# Patient Record
Sex: Female | Born: 1935 | Race: White | Hispanic: No | Marital: Married | Smoking: Never smoker
Health system: Southern US, Community
[De-identification: ages and names within clinical notes are randomized; demographics above are authoritative.]

## PROBLEM LIST (undated history)

## (undated) DIAGNOSIS — M51369 Other intervertebral disc degeneration, lumbar region without mention of lumbar back pain or lower extremity pain: Secondary | ICD-10-CM

## (undated) DIAGNOSIS — I10 Essential (primary) hypertension: Secondary | ICD-10-CM

## (undated) DIAGNOSIS — M419 Scoliosis, unspecified: Secondary | ICD-10-CM

## (undated) DIAGNOSIS — M7070 Other bursitis of hip, unspecified hip: Secondary | ICD-10-CM

## (undated) DIAGNOSIS — H269 Unspecified cataract: Secondary | ICD-10-CM

## (undated) DIAGNOSIS — K529 Noninfective gastroenteritis and colitis, unspecified: Secondary | ICD-10-CM

## (undated) DIAGNOSIS — M7740 Metatarsalgia, unspecified foot: Secondary | ICD-10-CM

## (undated) DIAGNOSIS — I350 Nonrheumatic aortic (valve) stenosis: Secondary | ICD-10-CM

## (undated) DIAGNOSIS — Q828 Other specified congenital malformations of skin: Secondary | ICD-10-CM

## (undated) DIAGNOSIS — C801 Malignant (primary) neoplasm, unspecified: Secondary | ICD-10-CM

## (undated) DIAGNOSIS — M2040 Other hammer toe(s) (acquired), unspecified foot: Secondary | ICD-10-CM

## (undated) DIAGNOSIS — M5136 Other intervertebral disc degeneration, lumbar region: Secondary | ICD-10-CM

## (undated) DIAGNOSIS — H35341 Macular cyst, hole, or pseudohole, right eye: Secondary | ICD-10-CM

## (undated) DIAGNOSIS — L851 Acquired keratosis [keratoderma] palmaris et plantaris: Secondary | ICD-10-CM

## (undated) DIAGNOSIS — M1990 Unspecified osteoarthritis, unspecified site: Secondary | ICD-10-CM

## (undated) DIAGNOSIS — M4856XA Collapsed vertebra, not elsewhere classified, lumbar region, initial encounter for fracture: Secondary | ICD-10-CM

## (undated) HISTORY — DX: Other intervertebral disc degeneration, lumbar region without mention of lumbar back pain or lower extremity pain: M51.369

## (undated) HISTORY — DX: Other intervertebral disc degeneration, lumbar region: M51.36

## (undated) HISTORY — PX: BACK SURGERY: SHX140

## (undated) HISTORY — DX: Other bursitis of hip, unspecified hip: M70.70

## (undated) HISTORY — DX: Macular cyst, hole, or pseudohole, right eye: H35.341

## (undated) HISTORY — DX: Other hammer toe(s) (acquired), unspecified foot: M20.40

## (undated) HISTORY — DX: Metatarsalgia, unspecified foot: M77.40

## (undated) HISTORY — PX: TOE AMPUTATION: SHX809

## (undated) HISTORY — DX: Acquired keratosis (keratoderma) palmaris et plantaris: L85.1

## (undated) HISTORY — DX: Noninfective gastroenteritis and colitis, unspecified: K52.9

## (undated) HISTORY — PX: BREAST SURGERY: SHX581

## (undated) HISTORY — PX: OTHER SURGICAL HISTORY: SHX169

## (undated) HISTORY — DX: Collapsed vertebra, not elsewhere classified, lumbar region, initial encounter for fracture: M48.56XA

## (undated) HISTORY — PX: LUMBAR LAMINECTOMY: SHX95

## (undated) HISTORY — PX: COLON SURGERY: SHX602

---

## 1998-04-21 ENCOUNTER — Other Ambulatory Visit: Admission: RE | Admit: 1998-04-21 | Discharge: 1998-04-21 | Payer: Self-pay | Admitting: Oncology

## 1998-08-16 ENCOUNTER — Other Ambulatory Visit: Admission: RE | Admit: 1998-08-16 | Discharge: 1998-08-16 | Payer: Self-pay | Admitting: *Deleted

## 1998-09-16 ENCOUNTER — Other Ambulatory Visit: Admission: RE | Admit: 1998-09-16 | Discharge: 1998-09-16 | Payer: Self-pay | Admitting: *Deleted

## 1999-09-08 ENCOUNTER — Other Ambulatory Visit: Admission: RE | Admit: 1999-09-08 | Discharge: 1999-09-08 | Payer: Self-pay | Admitting: *Deleted

## 1999-10-31 ENCOUNTER — Encounter: Admission: RE | Admit: 1999-10-31 | Discharge: 1999-10-31 | Payer: Self-pay | Admitting: Geriatric Medicine

## 1999-10-31 ENCOUNTER — Encounter: Payer: Self-pay | Admitting: Geriatric Medicine

## 2000-01-18 ENCOUNTER — Encounter: Payer: Self-pay | Admitting: Geriatric Medicine

## 2000-01-18 ENCOUNTER — Encounter: Admission: RE | Admit: 2000-01-18 | Discharge: 2000-01-18 | Payer: Self-pay | Admitting: Geriatric Medicine

## 2000-09-11 ENCOUNTER — Ambulatory Visit: Admission: RE | Admit: 2000-09-11 | Discharge: 2000-09-11 | Payer: Self-pay | Admitting: Orthopedic Surgery

## 2000-10-16 ENCOUNTER — Other Ambulatory Visit: Admission: RE | Admit: 2000-10-16 | Discharge: 2000-10-16 | Payer: Self-pay | Admitting: *Deleted

## 2000-11-15 ENCOUNTER — Ambulatory Visit (HOSPITAL_COMMUNITY): Admission: RE | Admit: 2000-11-15 | Discharge: 2000-11-15 | Payer: Self-pay | Admitting: Gastroenterology

## 2001-11-14 ENCOUNTER — Other Ambulatory Visit: Admission: RE | Admit: 2001-11-14 | Discharge: 2001-11-14 | Payer: Self-pay | Admitting: *Deleted

## 2002-06-09 ENCOUNTER — Encounter: Payer: Self-pay | Admitting: Geriatric Medicine

## 2002-06-09 ENCOUNTER — Encounter: Admission: RE | Admit: 2002-06-09 | Discharge: 2002-06-09 | Payer: Self-pay | Admitting: Geriatric Medicine

## 2003-05-12 ENCOUNTER — Other Ambulatory Visit: Admission: RE | Admit: 2003-05-12 | Discharge: 2003-05-12 | Payer: Self-pay | Admitting: Obstetrics & Gynecology

## 2003-05-26 ENCOUNTER — Ambulatory Visit (HOSPITAL_COMMUNITY): Admission: RE | Admit: 2003-05-26 | Discharge: 2003-05-26 | Payer: Self-pay | Admitting: Geriatric Medicine

## 2003-12-16 ENCOUNTER — Encounter: Admission: RE | Admit: 2003-12-16 | Discharge: 2003-12-16 | Payer: Self-pay | Admitting: Geriatric Medicine

## 2005-03-06 ENCOUNTER — Ambulatory Visit: Payer: Self-pay | Admitting: Oncology

## 2005-07-18 ENCOUNTER — Encounter: Admission: RE | Admit: 2005-07-18 | Discharge: 2005-07-18 | Payer: Self-pay | Admitting: Orthopedic Surgery

## 2005-10-25 ENCOUNTER — Ambulatory Visit (HOSPITAL_COMMUNITY): Admission: RE | Admit: 2005-10-25 | Discharge: 2005-10-25 | Payer: Self-pay | Admitting: Gastroenterology

## 2005-10-25 LAB — HM COLONOSCOPY

## 2005-12-29 ENCOUNTER — Encounter: Admission: RE | Admit: 2005-12-29 | Discharge: 2005-12-29 | Payer: Self-pay | Admitting: Geriatric Medicine

## 2006-03-05 ENCOUNTER — Ambulatory Visit: Payer: Self-pay | Admitting: Oncology

## 2006-03-07 LAB — COMPREHENSIVE METABOLIC PANEL
AST: 25 U/L (ref 0–37)
Albumin: 4.1 g/dL (ref 3.5–5.2)
Alkaline Phosphatase: 45 U/L (ref 39–117)
BUN: 15 mg/dL (ref 6–23)
CO2: 29 mEq/L (ref 19–32)
Calcium: 9.6 mg/dL (ref 8.4–10.5)
Chloride: 101 mEq/L (ref 96–112)
Glucose, Bld: 81 mg/dL (ref 70–99)
Total Bilirubin: 0.3 mg/dL (ref 0.3–1.2)

## 2006-03-07 LAB — CBC WITH DIFFERENTIAL/PLATELET
Basophils Absolute: 0 10*3/uL (ref 0.0–0.1)
Eosinophils Absolute: 0.1 10*3/uL (ref 0.0–0.5)
HGB: 12.4 g/dL (ref 11.6–15.9)
MCH: 29.5 pg (ref 26.0–34.0)
MCV: 88.9 fL (ref 81.0–101.0)
MONO#: 0.9 10*3/uL (ref 0.1–0.9)
NEUT#: 3.6 10*3/uL (ref 1.5–6.5)
NEUT%: 51.4 % (ref 39.6–76.8)
WBC: 7 10*3/uL (ref 3.9–10.0)

## 2006-03-27 ENCOUNTER — Encounter: Admission: RE | Admit: 2006-03-27 | Discharge: 2006-03-27 | Payer: Self-pay | Admitting: Geriatric Medicine

## 2007-03-04 ENCOUNTER — Ambulatory Visit: Payer: Self-pay | Admitting: Oncology

## 2007-03-06 LAB — CBC WITH DIFFERENTIAL/PLATELET
Basophils Absolute: 0 10*3/uL (ref 0.0–0.1)
Eosinophils Absolute: 0.1 10*3/uL (ref 0.0–0.5)
HCT: 36.1 % (ref 34.8–46.6)
LYMPH%: 36.4 % (ref 14.0–48.0)
MCH: 30 pg (ref 26.0–34.0)
MCHC: 34.6 g/dL (ref 32.0–36.0)
MONO#: 0.9 10*3/uL (ref 0.1–0.9)
MONO%: 13.5 % — ABNORMAL HIGH (ref 0.0–13.0)
NEUT%: 47.6 % (ref 39.6–76.8)
WBC: 6.9 10*3/uL (ref 3.9–10.0)

## 2007-03-06 LAB — COMPREHENSIVE METABOLIC PANEL
ALT: 13 U/L (ref 0–35)
Alkaline Phosphatase: 44 U/L (ref 39–117)
BUN: 18 mg/dL (ref 6–23)
Calcium: 9.5 mg/dL (ref 8.4–10.5)
Chloride: 105 mEq/L (ref 96–112)
Creatinine, Ser: 0.59 mg/dL (ref 0.40–1.20)
Potassium: 4.1 mEq/L (ref 3.5–5.3)

## 2007-03-06 LAB — LACTATE DEHYDROGENASE: LDH: 169 U/L (ref 94–250)

## 2007-03-06 LAB — CEA: CEA: 0.5 ng/mL (ref 0.0–5.0)

## 2007-10-03 DIAGNOSIS — H35341 Macular cyst, hole, or pseudohole, right eye: Secondary | ICD-10-CM

## 2007-10-03 HISTORY — DX: Macular cyst, hole, or pseudohole, right eye: H35.341

## 2008-08-19 ENCOUNTER — Encounter: Admission: RE | Admit: 2008-08-19 | Discharge: 2008-08-19 | Payer: Self-pay | Admitting: Geriatric Medicine

## 2009-05-16 ENCOUNTER — Emergency Department (HOSPITAL_COMMUNITY): Admission: EM | Admit: 2009-05-16 | Discharge: 2009-05-16 | Payer: Self-pay | Admitting: Emergency Medicine

## 2009-05-21 ENCOUNTER — Encounter: Admission: RE | Admit: 2009-05-21 | Discharge: 2009-05-21 | Payer: Self-pay | Admitting: Geriatric Medicine

## 2009-05-31 ENCOUNTER — Ambulatory Visit (HOSPITAL_COMMUNITY): Admission: RE | Admit: 2009-05-31 | Discharge: 2009-06-01 | Payer: Self-pay | Admitting: Neurosurgery

## 2009-09-15 ENCOUNTER — Encounter: Admission: RE | Admit: 2009-09-15 | Discharge: 2009-10-01 | Payer: Self-pay | Admitting: Neurosurgery

## 2009-10-04 ENCOUNTER — Encounter: Admission: RE | Admit: 2009-10-04 | Discharge: 2009-10-14 | Payer: Self-pay | Admitting: Neurosurgery

## 2010-12-28 ENCOUNTER — Other Ambulatory Visit: Payer: Self-pay | Admitting: Neurosurgery

## 2010-12-28 DIAGNOSIS — M545 Low back pain: Secondary | ICD-10-CM

## 2010-12-28 DIAGNOSIS — M25512 Pain in left shoulder: Secondary | ICD-10-CM

## 2010-12-31 ENCOUNTER — Ambulatory Visit
Admission: RE | Admit: 2010-12-31 | Discharge: 2010-12-31 | Disposition: A | Discharge status: Home / Self Care | Payer: Medicare Other | Source: Ambulatory Visit | Attending: Neurosurgery | Admitting: Neurosurgery

## 2010-12-31 DIAGNOSIS — M25512 Pain in left shoulder: Secondary | ICD-10-CM

## 2010-12-31 DIAGNOSIS — M25511 Pain in right shoulder: Secondary | ICD-10-CM

## 2010-12-31 DIAGNOSIS — M545 Low back pain: Secondary | ICD-10-CM

## 2011-01-07 LAB — CBC
HCT: 38.1 % (ref 36.0–46.0)
Hemoglobin: 13.2 g/dL (ref 12.0–15.0)
MCHC: 34.7 g/dL (ref 30.0–36.0)
MCV: 88.6 fL (ref 78.0–100.0)
Platelets: 364 10*3/uL (ref 150–400)
RBC: 4.3 MIL/uL (ref 3.87–5.11)
RDW: 13.8 % (ref 11.5–15.5)
WBC: 9.4 10*3/uL (ref 4.0–10.5)

## 2011-01-07 LAB — DIFFERENTIAL
Basophils Absolute: 0 10*3/uL (ref 0.0–0.1)
Eosinophils Relative: 1 % (ref 0–5)
Monocytes Absolute: 0.9 10*3/uL (ref 0.1–1.0)
Neutro Abs: 6.4 10*3/uL (ref 1.7–7.7)
Neutrophils Relative %: 68 % (ref 43–77)

## 2011-01-07 LAB — BASIC METABOLIC PANEL
BUN: 9 mg/dL (ref 6–23)
CO2: 24 mEq/L (ref 19–32)
Calcium: 9.1 mg/dL (ref 8.4–10.5)
Creatinine, Ser: 0.49 mg/dL (ref 0.4–1.2)
GFR calc non Af Amer: >60 mL/min (ref >60)

## 2011-01-07 LAB — TYPE AND SCREEN
ABO/RH(D): A POS
Antibody Screen: NEGATIVE

## 2011-01-25 ENCOUNTER — Ambulatory Visit: Payer: Medicare Other | Attending: Neurosurgery | Admitting: Physical Therapy

## 2011-01-25 DIAGNOSIS — M545 Low back pain, unspecified: Secondary | ICD-10-CM | POA: Insufficient documentation

## 2011-01-25 DIAGNOSIS — IMO0001 Reserved for inherently not codable concepts without codable children: Secondary | ICD-10-CM | POA: Insufficient documentation

## 2011-01-25 DIAGNOSIS — R293 Abnormal posture: Secondary | ICD-10-CM | POA: Insufficient documentation

## 2011-01-25 DIAGNOSIS — M542 Cervicalgia: Secondary | ICD-10-CM | POA: Insufficient documentation

## 2011-01-25 DIAGNOSIS — M2560 Stiffness of unspecified joint, not elsewhere classified: Secondary | ICD-10-CM | POA: Insufficient documentation

## 2011-02-01 ENCOUNTER — Ambulatory Visit: Payer: Medicare Other | Attending: Neurosurgery | Admitting: Physical Therapy

## 2011-02-01 DIAGNOSIS — M2560 Stiffness of unspecified joint, not elsewhere classified: Secondary | ICD-10-CM | POA: Insufficient documentation

## 2011-02-01 DIAGNOSIS — M545 Low back pain, unspecified: Secondary | ICD-10-CM | POA: Insufficient documentation

## 2011-02-01 DIAGNOSIS — IMO0001 Reserved for inherently not codable concepts without codable children: Secondary | ICD-10-CM | POA: Insufficient documentation

## 2011-02-01 DIAGNOSIS — M542 Cervicalgia: Secondary | ICD-10-CM | POA: Insufficient documentation

## 2011-02-01 DIAGNOSIS — R293 Abnormal posture: Secondary | ICD-10-CM | POA: Insufficient documentation

## 2011-02-03 ENCOUNTER — Ambulatory Visit: Payer: Medicare Other | Admitting: Physical Therapy

## 2011-02-07 ENCOUNTER — Encounter: Payer: Medicare Other | Admitting: Physical Therapy

## 2011-02-09 ENCOUNTER — Ambulatory Visit: Payer: Medicare Other | Admitting: Physical Therapy

## 2011-02-14 NOTE — Op Note (Signed)
NAMEBARBY, COLVARD              ACCOUNT NO.:  [id number]   MEDICAL RECORD NO.:  [id number]          PATIENT TYPE:  INP   LOCATION:  3528                         FACILITY:  [facility]   PHYSICIAN:  Sherilyn Cooter A. Pool, M.D.    DATE OF BIRTH:  14-Mar-1923   DATE OF PROCEDURE:  05/31/2009  DATE OF DISCHARGE:                               OPERATIVE REPORT   PREOPERATIVE DIAGNOSIS:  Right L5-S1 herniated nucleus pulposus with  radiculopathy.   POSTOPERATIVE DIAGNOSIS:  Right L5-S1 herniated nucleus pulposus with  radiculopathy.   PROCEDURE NOTE:  Right L5-S1 laminotomy and microdiskectomy.   SURGEON:  Kathaleen Maser. Pool, MD   ASSISTANT:  Donalee Citrin, MD   ANESTHESIA:  General endotracheal.   INDICATIONS:  Ms. Duce is an 77 year old female with history of back  and right lower extremity pain presents with weakness consistent with a  right-sided L5 radiculopathy.  Workup demonstrates evidence of a large  right-sided L5-S1 disk herniation with a superior free fragment causing  marked compression exiting right L5 nerve root.  The patient has been  counseled as to her options.  She decided to proceed with a right-sided  L5-S1 laminotomy and microdiskectomy to help treatment of her symptoms.   OPERATIVE NOTE:  The patient was taken to the operating room table and  placed in the supine position.  Adequate level of anesthesia was  achieved.  The patient was prone onto Wilson frame, appropriately padded  the patient's lumbar region, and prepped and draped sterilely.  A #10  blade was used to make a curvilinear skin incision overlying the L4-L5  interspace.  This was carried sharply in the midline.  Subperiosteal  dissection was performed on the right side exposing the lamina and facet  joints of L5 and S1.  Deep self-retaining retractor was placed.  Intraoperative x-rays were taken and level was confirmed.  A laminotomy  was then performed using a high-speed drill and Kerrison rongeurs to  remove the  inferior aspect of the lamina of L5, medial aspect of the L5-  S1 facet joint, and the superior rim of the S1 lamina.  Ligamentum  flavum was then elevated and resected in piecemeal fashion using  Kerrison rongeurs.  Underlying the thecal sac and exiting L5 and S1  nerve roots were identified.  Microscope was brought to the field for  microdissection of the disk herniation and nerve roots.  Epidural venous  plexus was coagulated.  The thecal sac and S1 nerve root gently  mobilized and tracked towards the midline.  Disk herniation was readily  apparent.  This was then dissected free and removed using blunt nerve  hooks and pituitary rongeurs.  All elements of the disk herniation were  resected.  Disk space was incised with 15 blade in a rectangular  fashion.  Wide disk space clean-out was achieved using pituitary  rongeurs, upbitting pituitary rongeurs, and Epstein curettes.  All  elements of the disk herniation were resected.  All loose or obviously  degenerative disk material was then removed from the interspace.  At  this point, a very thorough diskectomy had been achieved.  There was no  injury to the thecal sac or nerve roots.  The wound was then irrigated  with antibiotic solution.  Gelfoam was placed topically for  hemostasis and found to be good.  Microscope and retractors were  removed.  Hemostasis of the muscles was achieved with electrocautery.  Wounds were closed in layers with Vicryl suture.  Steri-Strips and  sterile dressing were applied.  The patient tolerated the procedure well  and she returned to the recovery room postoperatively.           ______________________________  Kathaleen Maser Pool, M.D.     HAP/MEDQ  D:  05/31/2009  T:  06/01/2009  Job:  [id number]

## 2011-02-16 ENCOUNTER — Ambulatory Visit: Payer: Medicare Other | Admitting: Physical Therapy

## 2011-02-17 NOTE — Procedures (Signed)
[facility]  Patient:    Whitney Marquez, Whitney Marquez                     MRN: [id number] Proc. Date: 11/15/00 Adm. Date:  [id number] Disc. Date: [id number] Attending:  Dennison Bulla Ii CC:         Hal T. Stoneking, M.D.  Angelia Mould. Derrell Lolling, M.D.   Procedure Report  REFERRING PHYSICIAN:  Hal T. Stoneking, M.D./Haywood M. Derrell Lolling, M.D.  PROCEDURE INDICATION:  [name]. Badders (date of birth is 1923-03-26), is a 77 year old female.  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  Her March 1999 colonoscopy was normal.  She occasionally experiences discomfort in the left and right lower quadrants of her abdomen.  She is due for surveillance colonoscopy with possible polypectomy to prevent recurrent colon cancer.  I discussed with [name] the complications associated with colonoscopy and polypectomy including intestinal bleeding and intestinal perforation.  [name] has signed the operative permit.  ENDOSCOPIST:  Verlin Grills, M.D.  PREMEDICATION:  Fentanyl 25 mcg, Versed 5 mg  ENDOSCOPE:  Olympus pediatric colonoscope.  DESCRIPTION OF PROCEDURE:  After obtaining informed consent, the patient was placed in the left lateral decubitus position.  I administered intravenous Fentanyl and intravenous Versed to achieve conscious sedation for the procedure.  The patients blood pressure, oxygen saturation and cardiac rhythm were monitored throughout the procedure and documented in the medical record.  Anal inspection was normal.  Digital rectal exam was normal.  The Olympus pediatric video colonoscope was introduced into the rectum and under direct vision advanced to the ileoright colonic anastomosis.  Colonic preparation for the exam today was excellent.  Rectum normal.  Sigmoid colon and descending colon normal.  Splenic flexure normal.  Transverse colon normal.  Hepatic  flexure normal.  Ascending colon normal.  Ileoright colonic anastomosis normal.  ASSESSMENT:  Normal proctocolonoscopy to the ileoright colonic surgical anastomosis.  No endoscopic evidence for the presence of recurrent colorectal neoplasia.  RECOMMENDATIONS:  Repeat colonoscopy in approximately five years. DD:  11/15/00 TD:  11/15/00 Job: [id number]

## 2011-02-17 NOTE — Op Note (Signed)
[name], [name]              ACCOUNT NO.:  [id number]   MEDICAL RECORD NO.:  [id number]          PATIENT TYPE:  AMB   LOCATION:  ENDO                         FACILITY[facility]   PHYSICIAN:  Danise Edge, M.D.   DATE OF BIRTH:  Jan 27, 1923   DATE OF PROCEDURE:  10/25/2005  DATE OF DISCHARGE:                                 OPERATIVE REPORT   PROCEDURE:  Surveillance colonoscopy.   INDICATIONS:  Ms. Whitney Marquez is an 77 year old female born October 31, 1922. In 1998, Ms. [name] underwent a right hemicolectomy to remove a Dukes  B adenocarcinoma of the colon. Surveillance colonoscopies in 1999 and in  2001 were normal.   ENDOSCOPIST:  Danise Edge, M.D.   PREMEDICATION:  Versed 4 mg, fentanyl 25 mcg.   DESCRIPTION OF PROCEDURE:  After obtaining informed consent, Ms. [name] was  placed in the left lateral decubitus position. I administered intravenous  fentanyl and intravenous Versed to achieve conscious sedation for the  procedure. The patient's blood pressure, oxygen saturation and cardiac  rhythm were monitored throughout the procedure and documented in the medical  record.   Anal inspection and digital rectal exam were normal. The Olympus adjustable  pediatric colonoscope was introduced into the rectum and easily advanced to  the ileo right colonic surgical anastomosis. Colonic preparation for the  exam today was excellent.   RECTUM:  Normal. Retroflexed view of the distal rectum normal.  SIGMOID COLON AND DESCENDING COLON:  A few small diverticula are present.  SPLENIC FLEXURE:  Normal.  TRANSVERSE COLON:  Normal.  HEPATIC FLEXURE:  Normal.  ASCENDING COLON:  Normal.  ILEO RIGHT COLONIC SURGICAL ANASTOMOSIS:  Normal.   ASSESSMENT:  Normal surveillance proctocolonoscopy to the ileo right colonic  surgical anastomosis. No endoscopic evidence for the presence of recurrent  colorectal neoplasia. A few small diverticula are present in the left colon.   RECOMMENDATIONS:  I do not think Ms. [name] will require any further  colonoscopic surveillance.           ______________________________  Danise Edge, M.D.     MJ/MEDQ  D:  10/25/2005  T:  10/26/2005  Job:  [id number]   cc:   Hal T. Stoneking, M.D.  Fax: [phone]

## 2011-02-21 ENCOUNTER — Ambulatory Visit: Payer: Medicare Other | Admitting: Physical Therapy

## 2011-02-23 ENCOUNTER — Ambulatory Visit: Payer: Medicare Other | Admitting: Physical Therapy

## 2011-02-28 ENCOUNTER — Ambulatory Visit: Payer: Medicare Other | Admitting: Physical Therapy

## 2011-03-02 ENCOUNTER — Encounter: Payer: Medicare Other | Admitting: Physical Therapy

## 2011-06-20 ENCOUNTER — Ambulatory Visit: Payer: Medicare Other | Attending: Neurosurgery

## 2011-06-20 DIAGNOSIS — M545 Low back pain, unspecified: Secondary | ICD-10-CM | POA: Insufficient documentation

## 2011-06-20 DIAGNOSIS — M2560 Stiffness of unspecified joint, not elsewhere classified: Secondary | ICD-10-CM | POA: Insufficient documentation

## 2011-06-20 DIAGNOSIS — IMO0001 Reserved for inherently not codable concepts without codable children: Secondary | ICD-10-CM | POA: Insufficient documentation

## 2011-06-20 DIAGNOSIS — R293 Abnormal posture: Secondary | ICD-10-CM | POA: Insufficient documentation

## 2011-06-20 DIAGNOSIS — M542 Cervicalgia: Secondary | ICD-10-CM | POA: Insufficient documentation

## 2011-06-22 ENCOUNTER — Ambulatory Visit: Payer: Medicare Other

## 2011-06-26 ENCOUNTER — Ambulatory Visit: Payer: Medicare Other | Admitting: Rehabilitation

## 2011-06-28 ENCOUNTER — Ambulatory Visit: Payer: Medicare Other | Admitting: Rehabilitation

## 2011-06-29 ENCOUNTER — Encounter: Payer: Medicare Other | Admitting: Rehabilitation

## 2011-07-11 ENCOUNTER — Ambulatory Visit: Payer: Medicare Other | Attending: Neurosurgery

## 2011-07-11 DIAGNOSIS — R293 Abnormal posture: Secondary | ICD-10-CM | POA: Insufficient documentation

## 2011-07-11 DIAGNOSIS — IMO0001 Reserved for inherently not codable concepts without codable children: Secondary | ICD-10-CM | POA: Insufficient documentation

## 2011-07-11 DIAGNOSIS — M545 Low back pain, unspecified: Secondary | ICD-10-CM | POA: Insufficient documentation

## 2011-07-11 DIAGNOSIS — M2560 Stiffness of unspecified joint, not elsewhere classified: Secondary | ICD-10-CM | POA: Insufficient documentation

## 2011-07-11 DIAGNOSIS — M542 Cervicalgia: Secondary | ICD-10-CM | POA: Insufficient documentation

## 2011-07-13 ENCOUNTER — Ambulatory Visit: Payer: Medicare Other | Admitting: Rehabilitation

## 2011-07-18 ENCOUNTER — Ambulatory Visit: Payer: Medicare Other | Admitting: Rehabilitation

## 2011-07-20 ENCOUNTER — Ambulatory Visit: Payer: Medicare Other

## 2011-07-24 ENCOUNTER — Ambulatory Visit: Payer: Medicare Other | Admitting: Rehabilitative and Restorative Service Providers"

## 2011-07-26 ENCOUNTER — Ambulatory Visit: Payer: Medicare Other | Admitting: Rehabilitation

## 2011-08-01 ENCOUNTER — Ambulatory Visit: Payer: Medicare Other

## 2011-08-03 ENCOUNTER — Ambulatory Visit: Payer: Medicare Other | Attending: Neurosurgery

## 2011-08-03 DIAGNOSIS — M545 Low back pain, unspecified: Secondary | ICD-10-CM | POA: Insufficient documentation

## 2011-08-03 DIAGNOSIS — R293 Abnormal posture: Secondary | ICD-10-CM | POA: Insufficient documentation

## 2011-08-03 DIAGNOSIS — IMO0001 Reserved for inherently not codable concepts without codable children: Secondary | ICD-10-CM | POA: Insufficient documentation

## 2011-08-03 DIAGNOSIS — M2560 Stiffness of unspecified joint, not elsewhere classified: Secondary | ICD-10-CM | POA: Insufficient documentation

## 2011-08-03 DIAGNOSIS — M542 Cervicalgia: Secondary | ICD-10-CM | POA: Insufficient documentation

## 2013-02-05 LAB — HM PAP SMEAR

## 2013-03-25 ENCOUNTER — Emergency Department (INDEPENDENT_AMBULATORY_CARE_PROVIDER_SITE_OTHER): Payer: Medicare Other

## 2013-03-25 ENCOUNTER — Encounter (HOSPITAL_COMMUNITY): Payer: Self-pay | Admitting: *Deleted

## 2013-03-25 ENCOUNTER — Emergency Department (INDEPENDENT_AMBULATORY_CARE_PROVIDER_SITE_OTHER)
Admission: EM | Admit: 2013-03-25 | Discharge: 2013-03-25 | Disposition: A | Discharge status: Home / Self Care | Payer: Medicare Other | Source: Home / Self Care | Attending: Family Medicine | Admitting: Family Medicine

## 2013-03-25 DIAGNOSIS — S52599A Other fractures of lower end of unspecified radius, initial encounter for closed fracture: Secondary | ICD-10-CM

## 2013-03-25 DIAGNOSIS — S52501A Unspecified fracture of the lower end of right radius, initial encounter for closed fracture: Secondary | ICD-10-CM

## 2013-03-25 HISTORY — DX: Malignant (primary) neoplasm, unspecified: C80.1

## 2013-03-25 HISTORY — DX: Essential (primary) hypertension: I10

## 2013-03-25 HISTORY — DX: Unspecified cataract: H26.9

## 2013-03-25 MED ORDER — TRAMADOL HCL 50 MG PO TABS: 50.0000 mg | ORAL_TABLET | Freq: Four times a day (QID) | ORAL | Status: DC | PRN

## 2013-03-25 MED ORDER — ACETAMINOPHEN 500 MG PO TABS: 500.0000 mg | ORAL_TABLET | Freq: Four times a day (QID) | ORAL | Status: DC | PRN

## 2013-03-25 NOTE — ED Notes (Signed)
Pt reports    She  Felled     yest  Landed  On  Her  r  Wrist  She  Has  Pain  /  Swelling  Noted         denys  Any  Other  injurys

## 2013-03-25 NOTE — Discharge Instructions (Signed)
Cast or Splint Care  Casts and splints support injured limbs and keep bones from moving while they heal.   HOME CARE   Keep the cast or splint uncovered during the drying period.   A plaster cast can take 24 to 48 hours to dry.   A fiberglass cast will dry in less than 1 hour.   Do not rest the cast on anything harder than a pillow for 24 hours.   Do not put weight on your injured limb. Do not put pressure on the cast. Wait for your doctor's approval.   Keep the cast or splint dry.   Cover the cast or splint with a plastic bag during baths or wet weather.   If you have a cast over your chest and belly (trunk), take sponge baths until the cast is taken off.   Keep your cast or splint clean. Wash a dirty cast with a damp cloth.   Do not put any objects under your cast or splint. Do not scratch the skin under the cast with an object.   Do not take out the padding from inside your cast.   Exercise your joints near the cast as told by your doctor.   Raise (elevate) your injured limb on 1 or 2 pillows for the first 1 to 3 days.  GET HELP RIGHT AWAY IF:   Your cast or splint cracks.   Your cast or splint is too tight or too loose.   You itch badly under the cast.   Your cast gets wet or has a soft spot.   You have a bad smell coming from the cast.   You get an object stuck under the cast.   Your skin around the cast becomes red or raw.   You have new or more pain after the cast is put on.   You have fluid leaking through the cast.   You cannot move your fingers or toes.   Your fingers or toes turn colors or are cool, painful, or puffy (swollen).   You have tingling or lose feeling (numbness) around the injured area.   You have pain or pressure under the cast.   You have trouble breathing or have shortness of breath.   You have chest pain.  MAKE SURE YOU:   Understand these instructions.   Will watch your condition.   Will get help right away if you are not doing well or get worse.  Document  Released: 01/18/2011 Document Revised: 12/11/2011 Document Reviewed: 01/18/2011  Exit[employer] Patient Information 2014 [employer].

## 2013-03-25 NOTE — ED Provider Notes (Signed)
History    CSN: [id number] Arrival date & time 03/25/13  1002  First MD Initiated Contact with Patient 03/25/13 1036     Chief Complaint  Patient presents with  . Fall   (Consider location/radiation/quality/duration/timing/severity/associated sxs/prior Treatment) Patient is a 79 y.o. female presenting with fall. The history is provided by the patient and a relative. No language interpreter was used.  Fall This is a new problem. The current episode started yesterday. Pertinent negatives include no chest pain, no abdominal pain, no headaches and no shortness of breath. Associated symptoms comments: The patient was outside weaning yesterday around him. She fell to her right side and brace her fall with her right wrist. She developed pain and swelling throughout the rest of the day and into the evening. She took 2 doses of extra strength Tylenol. She called her PCP Dr. [facility] [place] and was instructed to go to urgent care as he would have you see her until tomorrow.  She denies head trauma. Her only pain is right wrist the pain is diffuse of both the dorsal and volar aspect of the wrist. The pain is worse with movement. She describes the pain as 10 out of 10 severity. She does have a history of poor balance he did have a fall about 6 months ago while she was kneeling in her garden.  . She has tried acetaminophen for the symptoms. The treatment provided mild relief.   Past Medical History  Diagnosis Date  . Hypertension   . Cancer   . Cataract    Past Surgical History  Procedure Laterality Date  . Back surgery    . Colon surgery     History reviewed. No pertinent family history. History  Substance Use Topics  . Smoking status: Never Smoker   . Smokeless tobacco: Not on file  . Alcohol Use: No   OB History   Grav Para Term Preterm Abortions TAB SAB Ect Mult Living                 Review of Systems  Constitutional: Negative.   Respiratory: Negative for  shortness of breath.   Cardiovascular: Negative for chest pain.  Gastrointestinal: Negative for nausea and abdominal pain.  Musculoskeletal: Positive for myalgias and joint swelling.  Neurological: Negative for headaches.    Allergies  Cephalosporins; Iodine; Macrolides and ketolides; Penicillins; and Pyridium  Home Medications   Current Outpatient Rx  Name  Route  Sig  Dispense  Refill  . Alendronate Sodium (FOSAMAX PO)   Oral   Take by mouth.         Marland Kitchen amLODipine (NORVASC) 2.5 MG tablet   Oral   Take 2.5 mg by mouth daily.         Marland Kitchen aspirin 81 MG tablet   Oral   Take 81 mg by mouth daily.         . fenofibrate 54 MG tablet   Oral   Take 54 mg by mouth daily.         . Multiple Vitamins-Minerals (CENTRUM PO)   Oral   Take by mouth.          BP 138/74  Pulse 85  Temp(Src) 97.6 F (36.4 C) (Oral)  Resp 16  SpO2 91% Physical Exam  Constitutional: She appears well-developed and well-nourished. No distress.  HENT:  Head: Normocephalic and atraumatic.  Cardiovascular: Normal pulses.   Pulses:      Radial pulses are 2+ on the right side.  Musculoskeletal:       Right wrist: She exhibits decreased range of motion, tenderness and swelling.  Ulnar deviation of MCP joint diffusely.     ED Course  Procedures (including critical care time) Labs Reviewed - No data to display Dg Wrist Complete Right  03/25/2013   *RADIOLOGY REPORT*  Clinical Data: Cough fall.  Wrist pain and swelling.  Bruising around the right wrist.  Pain in the proximal fourth and fifth metacarpals.  RIGHT WRIST - COMPLETE 3+ VIEW  Comparison: None.  Findings: The alignment the wrist is within normal limits.  There is soft tissue swelling over the dorsum of the wrist.  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  Metacarpal bases appear intact.  There is abnormal flaring of the ulnar aspect of the distal radius adjacent to the distal radial ulnar joint. Additionally,  there is rarefaction of bone in the distal radial metaphysis. Although this could be degenerative, fracture cannot be excluded and follow-up CT is recommended.  Chondrocalcinosis of the triangular fibrocartilage noted.  IMPRESSION: Suspected distal radius fracture adjacent to the distal radial ulnar joint. Noncontrast CT of the right wrist recommended.   Original Report Authenticated By: Andreas Newport, M.D.   No diagnosis found.  MDM  Reviewed x-ray with patient. She has a distal radius fracture adjacent to the ulnar joint.I discussed the patient with Dr. Izora Ribas (Hand Surgery).   Plan: Sugar tong splint.  Hand surgery follow up patient provided number to call for appt.  Pain control: Schedule tylenol 500 mg every 6 hrs.  Tramadol 50 mg q 6 prn.   Dessa Phi, MD 03/25/13 1226

## 2013-03-25 NOTE — ED Notes (Signed)
Went to obtain patient for x-rays, attending wanted to exam patient.

## 2013-04-08 NOTE — ED Provider Notes (Signed)
Medical screening examination/treatment/procedure(s) were performed by resident physician or non-physician practitioner and as supervising physician I was immediately available for consultation/collaboration.   KINDL,JAMES DOUGLAS MD.   James D Kindl, MD 04/08/13 0846 

## 2013-09-15 DIAGNOSIS — L851 Acquired keratosis [keratoderma] palmaris et plantaris: Secondary | ICD-10-CM

## 2013-09-15 HISTORY — DX: Acquired keratosis (keratoderma) palmaris et plantaris: L85.1

## 2014-01-13 ENCOUNTER — Ambulatory Visit (HOSPITAL_COMMUNITY): Payer: Medicare Other | Attending: Cardiovascular Disease | Admitting: Radiology

## 2014-01-13 ENCOUNTER — Other Ambulatory Visit (HOSPITAL_COMMUNITY): Payer: Self-pay | Admitting: Geriatric Medicine

## 2014-01-13 DIAGNOSIS — I359 Nonrheumatic aortic valve disorder, unspecified: Secondary | ICD-10-CM

## 2014-01-13 DIAGNOSIS — I059 Rheumatic mitral valve disease, unspecified: Secondary | ICD-10-CM

## 2014-01-13 NOTE — Progress Notes (Signed)
Echocardiogram performed.  

## 2014-01-21 ENCOUNTER — Other Ambulatory Visit: Payer: Self-pay | Admitting: Sports Medicine

## 2014-01-21 DIAGNOSIS — IMO0002 Reserved for concepts with insufficient information to code with codable children: Secondary | ICD-10-CM

## 2014-01-27 ENCOUNTER — Ambulatory Visit
Admission: RE | Admit: 2014-01-27 | Discharge: 2014-01-27 | Disposition: A | Discharge status: Home / Self Care | Payer: Medicare Other | Source: Ambulatory Visit | Attending: Sports Medicine | Admitting: Sports Medicine

## 2014-01-27 DIAGNOSIS — IMO0002 Reserved for concepts with insufficient information to code with codable children: Secondary | ICD-10-CM

## 2014-02-24 LAB — HM DEXA SCAN

## 2014-03-31 ENCOUNTER — Ambulatory Visit: Payer: Medicare Other | Attending: Physical Medicine and Rehabilitation

## 2014-03-31 DIAGNOSIS — R293 Abnormal posture: Secondary | ICD-10-CM | POA: Insufficient documentation

## 2014-03-31 DIAGNOSIS — R262 Difficulty in walking, not elsewhere classified: Secondary | ICD-10-CM | POA: Insufficient documentation

## 2014-03-31 DIAGNOSIS — M545 Low back pain, unspecified: Secondary | ICD-10-CM | POA: Insufficient documentation

## 2014-03-31 DIAGNOSIS — IMO0001 Reserved for inherently not codable concepts without codable children: Secondary | ICD-10-CM | POA: Insufficient documentation

## 2014-04-07 ENCOUNTER — Ambulatory Visit: Payer: Medicare Other | Attending: Physical Medicine and Rehabilitation

## 2014-04-07 DIAGNOSIS — M545 Low back pain, unspecified: Secondary | ICD-10-CM | POA: Diagnosis not present

## 2014-04-07 DIAGNOSIS — R262 Difficulty in walking, not elsewhere classified: Secondary | ICD-10-CM | POA: Diagnosis not present

## 2014-04-07 DIAGNOSIS — IMO0001 Reserved for inherently not codable concepts without codable children: Secondary | ICD-10-CM | POA: Diagnosis not present

## 2014-04-07 DIAGNOSIS — R293 Abnormal posture: Secondary | ICD-10-CM | POA: Diagnosis not present

## 2014-04-09 ENCOUNTER — Ambulatory Visit: Payer: Medicare Other

## 2014-04-09 DIAGNOSIS — IMO0001 Reserved for inherently not codable concepts without codable children: Secondary | ICD-10-CM | POA: Diagnosis not present

## 2014-04-14 ENCOUNTER — Ambulatory Visit: Payer: Medicare Other | Admitting: Physical Therapy

## 2014-04-16 ENCOUNTER — Ambulatory Visit: Payer: Medicare Other | Admitting: Physical Therapy

## 2014-04-16 DIAGNOSIS — IMO0001 Reserved for inherently not codable concepts without codable children: Secondary | ICD-10-CM | POA: Diagnosis not present

## 2014-04-22 ENCOUNTER — Ambulatory Visit: Payer: Medicare Other | Admitting: Physical Therapy

## 2014-04-22 DIAGNOSIS — IMO0001 Reserved for inherently not codable concepts without codable children: Secondary | ICD-10-CM | POA: Diagnosis not present

## 2014-04-28 ENCOUNTER — Ambulatory Visit: Payer: Medicare Other | Admitting: Physical Therapy

## 2014-04-28 DIAGNOSIS — IMO0001 Reserved for inherently not codable concepts without codable children: Secondary | ICD-10-CM | POA: Diagnosis not present

## 2014-04-30 ENCOUNTER — Ambulatory Visit: Payer: Medicare Other | Admitting: Physical Therapy

## 2014-04-30 DIAGNOSIS — IMO0001 Reserved for inherently not codable concepts without codable children: Secondary | ICD-10-CM | POA: Diagnosis not present

## 2014-05-14 ENCOUNTER — Ambulatory Visit (INDEPENDENT_AMBULATORY_CARE_PROVIDER_SITE_OTHER): Payer: Medicare Other | Admitting: Podiatry

## 2014-05-14 ENCOUNTER — Encounter: Payer: Self-pay | Admitting: Podiatry

## 2014-05-14 DIAGNOSIS — M79609 Pain in unspecified limb: Secondary | ICD-10-CM

## 2014-05-14 DIAGNOSIS — L84 Corns and callosities: Secondary | ICD-10-CM

## 2014-05-14 DIAGNOSIS — M79673 Pain in unspecified foot: Secondary | ICD-10-CM

## 2014-05-14 DIAGNOSIS — B351 Tinea unguium: Secondary | ICD-10-CM

## 2014-05-14 NOTE — Progress Notes (Signed)
   Subjective:    Patient ID: Whitney Marquez, female    DOB: 07-06-1923, 80 y.o.   MRN: [id number]  HPI Comments: "I need the calluses looked at"  Patient has painful calluses plantar forefoot bilateral for several years. She keeps the areas padded.   Also would like her toenails cut.     Review of Systems  HENT: Positive for hearing loss.   Eyes: Positive for visual disturbance.  Musculoskeletal: Positive for arthralgias, back pain, gait problem and myalgias.  Allergic/Immunologic: Positive for food allergies.  Neurological: Positive for weakness and light-headedness.  Hematological: Bruises/bleeds easily.  All other systems reviewed and are negative.      Objective:   Physical Exam        Assessment & Plan:

## 2014-05-15 NOTE — Progress Notes (Signed)
Subjective:     Patient ID: Whitney Marquez, female   DOB: 1923-06-01, 80 y.o.   MRN: [id number]  HPI patient presents with painful nail disease 1-5 of both feet that are thick and hard for her to cut along with lesion formation on the bottom of the right foot that becomes painful and has been going on now for a long time. States that she would like to have this done on a routine basis and cannot take care of herself   Review of Systems  All other systems reviewed and are negative.      Objective:   Physical Exam  Nursing note and vitals reviewed. Constitutional: She is oriented to person, place, and time.  Cardiovascular: Intact distal pulses.   Musculoskeletal: Normal range of motion.  Neurological: She is oriented to person, place, and time.  Skin: Skin is warm and dry.   neurovascular status intact with muscle strength adequate and range of motion diminished subtalar midtarsal joint. Patient's found to have thick nail disease with pain 1-5 both feet and keratotic lesion formation plantar right that is tender with ambulation    Assessment:     Chronic nail disease with pain 1-5 both feet and keratotic lesion plantar aspect right that's painful    Plan:     H&P performed and discussed and debridement nailbeds 1-5 both feet with debridement of callus right which will be done on a routine basis. Reappoint her recheck

## 2014-07-27 ENCOUNTER — Ambulatory Visit (INDEPENDENT_AMBULATORY_CARE_PROVIDER_SITE_OTHER): Payer: Medicare Other | Admitting: Podiatry

## 2014-07-27 DIAGNOSIS — L84 Corns and callosities: Secondary | ICD-10-CM

## 2014-07-27 DIAGNOSIS — M79673 Pain in unspecified foot: Secondary | ICD-10-CM | POA: Diagnosis not present

## 2014-07-27 DIAGNOSIS — B351 Tinea unguium: Secondary | ICD-10-CM

## 2014-07-27 NOTE — Progress Notes (Signed)
   Subjective:    Patient ID: Whitney Marquez Setting, female    DOB: 31-Aug-1923, 80 y.o.   MRN: [id number]  HPI Pt presents for nail debridement   Review of Systems     Objective:   Physical Exam        Assessment & Plan:

## 2014-07-28 NOTE — Progress Notes (Signed)
Subjective:     Patient ID: Whitney Marquez, female   DOB: 05-31-1923, 80 y.o.   MRN: [id number]  HPI patient presents with nail disease 1-5 both feet and keratotic lesion plantar right hip becomes very sore and she states she needs new inserts   Review of Systems     Objective:   Physical Exam Neurovascular status intact with no health history changes and noted to have severe keratotic lesion submetatarsal of both feet right over left nail disease with thickness yellow brittle debris and pain and a flexible foot type    Assessment:     Chronic keratotic lesion and chronic painful nail disease 1-5 both feet that she cannot cut along with structural changes    Plan:     Reviewed all conditions and today debrided plantar lesion and nail bed 1-5 both feet and scan for custom orthotics to reduce plantar pressure

## 2014-08-17 ENCOUNTER — Other Ambulatory Visit: Payer: Medicare Other

## 2014-08-17 ENCOUNTER — Ambulatory Visit (INDEPENDENT_AMBULATORY_CARE_PROVIDER_SITE_OTHER): Payer: Medicare Other | Admitting: Podiatry

## 2014-08-17 VITALS — BP 109/68 | HR 66 | Resp 16

## 2014-08-17 DIAGNOSIS — L84 Corns and callosities: Secondary | ICD-10-CM

## 2014-08-17 NOTE — Patient Instructions (Signed)

## 2014-08-17 NOTE — Progress Notes (Signed)
Pt is here to PUO 

## 2014-08-18 NOTE — Progress Notes (Signed)
Subjective:     Patient ID: Whitney Marquez, female   DOB: 04-03-1923, 80 y.o.   MRN: [id number]  HPIpatient presents to pickup orthotics and states she needs the calluses trimmed on her right foot   Review of Systems     Objective:   Physical Exam Neurovascular status unchanged with severe keratotic lesion right over left secondary to diminished fat pad with keratotic tissue formation    Assessment:     Chronic lesion formation bilateral    Plan:     Debride painful lesions bilateral with no iatrogenic bleeding and dispensed orthotics which fit well with instructions on usage

## 2014-08-21 ENCOUNTER — Other Ambulatory Visit: Payer: Medicare Other

## 2014-09-14 ENCOUNTER — Encounter: Payer: Self-pay | Admitting: Podiatry

## 2014-09-14 ENCOUNTER — Ambulatory Visit (INDEPENDENT_AMBULATORY_CARE_PROVIDER_SITE_OTHER): Payer: Medicare Other | Admitting: Podiatry

## 2014-09-14 VITALS — BP 120/82 | HR 100 | Resp 16

## 2014-09-14 DIAGNOSIS — L84 Corns and callosities: Secondary | ICD-10-CM

## 2014-09-14 DIAGNOSIS — M779 Enthesopathy, unspecified: Secondary | ICD-10-CM | POA: Diagnosis not present

## 2014-09-14 MED ORDER — TRIAMCINOLONE ACETONIDE 10 MG/ML IJ SUSP
10.0000 mg | Freq: Once | INTRAMUSCULAR | Status: AC
  Administered 2014-09-14: 10 mg

## 2014-09-15 NOTE — Progress Notes (Signed)
Subjective:     Patient ID: Whitney Marquez Setting, female   DOB: [birth year], 80 y.o.   MRN: [id number]  HPI patient presents stating I'm having a lot of pain with my right plantar foot and have trouble walking but I have done well with inserts   Review of Systems     Objective:   Physical Exam Neurovascular status unchanged with no other issues noted and found to have inflammation and pain around the fifth metatarsal right    Assessment:     Inflammatory capsulitis with keratotic lesion formation    Plan:     H&P and condition discussed at great length today I injected the plantar capsule 3 mg Kenalog 5 of Xylocaine and debris did lesion continue orthotics and reappoint when symptoms occur

## 2014-10-14 ENCOUNTER — Inpatient Hospital Stay (HOSPITAL_COMMUNITY)
Admission: EM | Admit: 2014-10-14 | Discharge: 2014-10-20 | DRG: 386 | Disposition: A | Discharge status: Home Health | Payer: Medicare Other | Attending: Internal Medicine | Admitting: Internal Medicine

## 2014-10-14 ENCOUNTER — Encounter (HOSPITAL_COMMUNITY): Payer: Self-pay | Admitting: Emergency Medicine

## 2014-10-14 DIAGNOSIS — R109 Unspecified abdominal pain: Secondary | ICD-10-CM

## 2014-10-14 DIAGNOSIS — S32019A Unspecified fracture of first lumbar vertebra, initial encounter for closed fracture: Secondary | ICD-10-CM | POA: Diagnosis present

## 2014-10-14 DIAGNOSIS — R531 Weakness: Secondary | ICD-10-CM

## 2014-10-14 DIAGNOSIS — W19XXXA Unspecified fall, initial encounter: Secondary | ICD-10-CM | POA: Diagnosis present

## 2014-10-14 DIAGNOSIS — Z853 Personal history of malignant neoplasm of breast: Secondary | ICD-10-CM

## 2014-10-14 DIAGNOSIS — S32000A Wedge compression fracture of unspecified lumbar vertebra, initial encounter for closed fracture: Secondary | ICD-10-CM

## 2014-10-14 DIAGNOSIS — R6510 Systemic inflammatory response syndrome (SIRS) of non-infectious origin without acute organ dysfunction: Secondary | ICD-10-CM | POA: Diagnosis present

## 2014-10-14 DIAGNOSIS — M179 Osteoarthritis of knee, unspecified: Secondary | ICD-10-CM | POA: Diagnosis present

## 2014-10-14 DIAGNOSIS — I350 Nonrheumatic aortic (valve) stenosis: Secondary | ICD-10-CM | POA: Diagnosis present

## 2014-10-14 DIAGNOSIS — K55039 Acute (reversible) ischemia of large intestine, extent unspecified: Secondary | ICD-10-CM | POA: Diagnosis present

## 2014-10-14 DIAGNOSIS — D649 Anemia, unspecified: Secondary | ICD-10-CM | POA: Diagnosis present

## 2014-10-14 DIAGNOSIS — M25561 Pain in right knee: Secondary | ICD-10-CM

## 2014-10-14 DIAGNOSIS — Z7982 Long term (current) use of aspirin: Secondary | ICD-10-CM

## 2014-10-14 DIAGNOSIS — E785 Hyperlipidemia, unspecified: Secondary | ICD-10-CM | POA: Diagnosis present

## 2014-10-14 DIAGNOSIS — K5150 Left sided colitis without complications: Principal | ICD-10-CM | POA: Diagnosis present

## 2014-10-14 DIAGNOSIS — Z85038 Personal history of other malignant neoplasm of large intestine: Secondary | ICD-10-CM

## 2014-10-14 DIAGNOSIS — E876 Hypokalemia: Secondary | ICD-10-CM | POA: Diagnosis present

## 2014-10-14 DIAGNOSIS — I10 Essential (primary) hypertension: Secondary | ICD-10-CM | POA: Diagnosis present

## 2014-10-14 DIAGNOSIS — K529 Noninfective gastroenteritis and colitis, unspecified: Secondary | ICD-10-CM

## 2014-10-14 DIAGNOSIS — H269 Unspecified cataract: Secondary | ICD-10-CM | POA: Diagnosis present

## 2014-10-14 DIAGNOSIS — S32010D Wedge compression fracture of first lumbar vertebra, subsequent encounter for fracture with routine healing: Secondary | ICD-10-CM

## 2014-10-14 HISTORY — DX: Nonrheumatic aortic (valve) stenosis: I35.0

## 2014-10-14 HISTORY — DX: Scoliosis, unspecified: M41.9

## 2014-10-14 HISTORY — DX: Unspecified cataract: H26.9

## 2014-10-14 HISTORY — DX: Other specified congenital malformations of skin: Q82.8

## 2014-10-14 MED ORDER — SODIUM CHLORIDE 0.9 % IV BOLUS (SEPSIS)
1000.0000 mL | Freq: Once | INTRAVENOUS | Status: AC
  Administered 2014-10-15: 1000 mL via INTRAVENOUS

## 2014-10-14 MED ORDER — FENTANYL CITRATE 0.05 MG/ML IJ SOLN
50.0000 ug | Freq: Once | INTRAMUSCULAR | Status: AC
  Administered 2014-10-15: 50 ug via INTRAVENOUS
  Filled 2014-10-14: qty 2

## 2014-10-14 NOTE — ED Provider Notes (Signed)
CSN: [id number]     Arrival date & time 10/14/14  2229 History   None    Chief Complaint  Patient presents with  . Diarrhea     (Consider location/radiation/quality/duration/timing/severity/associated sxs/prior Treatment) HPI Comments: Patient is an 81 yo F PMHx significant for history of breast and colon cancer, aortic valve stenosis, chronic back pain presenting to the ED for two complaints. The first complaint is 3 days of abdominal pain, distention with constipation with decreased by mouth intake, fatigue, generalized weakness. The patient and her family that this was that she be due to recently being started on tramadol. She typically does not have any issues with constipation. She states today she took a laxative which gave her 3 small nonbloody loose bowel movements. Patient also endorses she's having right knee and left hip pain after reaching for something, slipping her balance and falling onto her right leg. She denies hitting her head or any loss of consciousness. No modifying factors identified. Patient denies any fevers, chills, nausea, emesis, chest pain, shortness of breath, numbness in her extremities. No recent antibiotic use. Patient with colon resection.   Patient is a 81 y.o. female presenting with diarrhea.  Diarrhea Associated symptoms: abdominal pain     Past Medical History  Diagnosis Date  . Cancer     breast and colon  . Cataracts, both eyes   . Scoliosis   . Aortic valve stenosis   . Plantar keratosis    Past Surgical History  Procedure Laterality Date  . Meniscus tear    . Breast surgery      breast cancer  . Colon surgery      colon cancer  . Lumbar laminectomy    . Toe amputation      second toe   No family history on file. History  Substance Use Topics  . Smoking status: Never Smoker   . Smokeless tobacco: Not on file  . Alcohol Use: No   OB History    No data available     Review of Systems  Constitutional: Positive for fatigue.   Gastrointestinal: Positive for abdominal pain, diarrhea and abdominal distention.  All other systems reviewed and are negative.     Allergies  Cephalosporins; Macrobid; Penicillins; and Shellfish allergy  Home Medications   Prior to Admission medications   Not on File   BP 135/83 mmHg  Pulse 103  Temp(Src) 98.1 F (36.7 C) (Oral)  Resp 22  SpO2 95% Physical Exam  Constitutional: She is oriented to person, place, and time. She appears well-developed and well-nourished. No distress.  HENT:  Head: Normocephalic and atraumatic.  Right Ear: External ear normal.  Left Ear: External ear normal.  Nose: Nose normal.  Mouth/Throat: No oropharyngeal exudate.  Mucus membranes dry.   Eyes: Conjunctivae are normal.  Neck: Neck supple.  Cardiovascular: Regular rhythm and intact distal pulses.  Tachycardia present.   Murmur heard.  Systolic murmur is present  Pulmonary/Chest: Effort normal and breath sounds normal. No respiratory distress.  Abdominal: Soft. Bowel sounds are normal. There is tenderness.  Genitourinary: Rectal exam shows no mass, no tenderness and anal tone normal. Guaiac positive stool.  Brown stool on DRE. No impaction.   Musculoskeletal:       Right hip: Normal.       Left hip: She exhibits tenderness. She exhibits normal range of motion, normal strength, no bony tenderness, no swelling, no crepitus, no deformity and no laceration.       Right knee: She  exhibits normal range of motion, no swelling, no effusion, no ecchymosis and no deformity. Tenderness found.       Left knee: Normal.       Right ankle: Normal.       Left ankle: Normal.       Right upper leg: Normal.       Left upper leg: Normal.       Right lower leg: Normal.       Left lower leg: Normal.  Neurological: She is alert and oriented to person, place, and time.  Skin: Skin is warm and dry. She is not diaphoretic.  Nursing note and vitals reviewed.   ED Course  Procedures (including critical care  time) Medications  sodium chloride 0.9 % bolus 1,000 mL (1,000 mLs Intravenous New Bag/Given 10/15/14 0019)  fentaNYL (SUBLIMAZE) injection 50 mcg (50 mcg Intravenous Given 10/15/14 0019)  iohexol (OMNIPAQUE) 300 MG/ML solution 25 mL (25 mLs Oral Contrast Given 10/15/14 0053)    Labs Review Labs Reviewed  CBC WITH DIFFERENTIAL - Abnormal; Notable for the following:    WBC 26.8 (*)    Hemoglobin 11.5 (*)    HCT 34.1 (*)    All other components within normal limits  POC OCCULT BLOOD, ED - Abnormal; Notable for the following:    Fecal Occult Bld POSITIVE (*)    All other components within normal limits  URINE CULTURE  COMPREHENSIVE METABOLIC PANEL  URINALYSIS, ROUTINE W REFLEX MICROSCOPIC  TROPONIN I    Imaging Review No results found.   EKG Interpretation   Date/Time:  Wednesday October 14 2014 22:50:53 EST Ventricular Rate:  101 PR Interval:  155 QRS Duration: 75 QT Interval:  361 QTC Calculation: 468 R Axis:   3 Text Interpretation:  Sinus tachycardia Atrial premature complex No  previous ECGs available Confirmed by WENTZ  MD, ELLIOTT ([id number]) on  10/15/2014 12:32:08 AM      MDM   Final diagnoses:  Right knee pain  Abdominal pain  Weakness  Weakness  Weakness  Weakness    Filed Vitals:   10/14/14 2249  BP: 135/83  Pulse: 103  Temp: 98.1 F (36.7 C)  Resp: 22   CT scan, labs, X-rays ordered, pain managed. Patient signed out to Charlann Lange, PA-C with likely plan for admission.   Patient d/w with Dr. Eulis Foster, agrees with plan.       Harlow Mares, PA-C 10/15/14 [place], MD 10/16/14 [phone]

## 2014-10-14 NOTE — ED Notes (Signed)
Pt from home. Per family, pt has been constipated x 3 days. Pt's MD gave her laxatives which she began to take today. Pt has had 3 episodes of diarrhea and incontinence since beginning the laxative. Per EMS, pt also fell onto her knees earlier today. Pt was not brought into hospital. Pt c/o of RT knee pain and weakness. NAD noted. VSS.

## 2014-10-14 NOTE — ED Notes (Signed)
Pt from home, per family, pt has been constipated x 3 days. PCP gave pt laxatives which she began taking today. Pt noticed diarrhea and experienced some incontinence after taking laxative. Pt also c/o of some generalized weakness. Per EMS, pt fell down on her knees. EMS called to seen, but did not take pt to be seen. Pt c/o of pain in RT knee. NAD noted.

## 2014-10-15 ENCOUNTER — Emergency Department (HOSPITAL_COMMUNITY): Payer: Medicare Other

## 2014-10-15 ENCOUNTER — Encounter (HOSPITAL_COMMUNITY): Payer: Self-pay | Admitting: Radiology

## 2014-10-15 DIAGNOSIS — K529 Noninfective gastroenteritis and colitis, unspecified: Secondary | ICD-10-CM

## 2014-10-15 DIAGNOSIS — H269 Unspecified cataract: Secondary | ICD-10-CM | POA: Diagnosis present

## 2014-10-15 DIAGNOSIS — Z7982 Long term (current) use of aspirin: Secondary | ICD-10-CM | POA: Diagnosis not present

## 2014-10-15 DIAGNOSIS — D649 Anemia, unspecified: Secondary | ICD-10-CM | POA: Diagnosis present

## 2014-10-15 DIAGNOSIS — M179 Osteoarthritis of knee, unspecified: Secondary | ICD-10-CM | POA: Diagnosis present

## 2014-10-15 DIAGNOSIS — R109 Unspecified abdominal pain: Secondary | ICD-10-CM | POA: Diagnosis present

## 2014-10-15 DIAGNOSIS — E785 Hyperlipidemia, unspecified: Secondary | ICD-10-CM | POA: Diagnosis present

## 2014-10-15 DIAGNOSIS — I350 Nonrheumatic aortic (valve) stenosis: Secondary | ICD-10-CM | POA: Diagnosis present

## 2014-10-15 DIAGNOSIS — Z85038 Personal history of other malignant neoplasm of large intestine: Secondary | ICD-10-CM | POA: Diagnosis not present

## 2014-10-15 DIAGNOSIS — Z853 Personal history of malignant neoplasm of breast: Secondary | ICD-10-CM | POA: Diagnosis not present

## 2014-10-15 DIAGNOSIS — S32019A Unspecified fracture of first lumbar vertebra, initial encounter for closed fracture: Secondary | ICD-10-CM | POA: Diagnosis present

## 2014-10-15 DIAGNOSIS — R6510 Systemic inflammatory response syndrome (SIRS) of non-infectious origin without acute organ dysfunction: Secondary | ICD-10-CM | POA: Diagnosis present

## 2014-10-15 DIAGNOSIS — K5150 Left sided colitis without complications: Secondary | ICD-10-CM | POA: Diagnosis present

## 2014-10-15 DIAGNOSIS — I10 Essential (primary) hypertension: Secondary | ICD-10-CM | POA: Diagnosis present

## 2014-10-15 DIAGNOSIS — E876 Hypokalemia: Secondary | ICD-10-CM | POA: Diagnosis not present

## 2014-10-15 DIAGNOSIS — W19XXXA Unspecified fall, initial encounter: Secondary | ICD-10-CM | POA: Diagnosis present

## 2014-10-15 DIAGNOSIS — M4856XA Collapsed vertebra, not elsewhere classified, lumbar region, initial encounter for fracture: Secondary | ICD-10-CM

## 2014-10-15 DIAGNOSIS — S32010A Wedge compression fracture of first lumbar vertebra, initial encounter for closed fracture: Secondary | ICD-10-CM

## 2014-10-15 HISTORY — DX: Collapsed vertebra, not elsewhere classified, lumbar region, initial encounter for fracture: M48.56XA

## 2014-10-15 HISTORY — DX: Noninfective gastroenteritis and colitis, unspecified: K52.9

## 2014-10-15 LAB — CBC WITH DIFFERENTIAL/PLATELET
Basophils Absolute: 0 10*3/uL (ref 0.0–0.1)
Basophils Absolute: 0 10*3/uL (ref 0.0–0.1)
Basophils Relative: 0 % (ref 0–1)
Basophils Relative: 0 % (ref 0–1)
Eosinophils Absolute: 0 10*3/uL (ref 0.0–0.7)
Eosinophils Absolute: 0 10*3/uL (ref 0.0–0.7)
Eosinophils Relative: 0 % (ref 0–5)
Eosinophils Relative: 0 % (ref 0–5)
HCT: 34.1 % — ABNORMAL LOW (ref 36.0–46.0)
HCT: 35.2 % — ABNORMAL LOW (ref 36.0–46.0)
Hemoglobin: 11.5 g/dL — ABNORMAL LOW (ref 12.0–15.0)
Hemoglobin: 12 g/dL (ref 12.0–15.0)
Lymphocytes Relative: 4 % — ABNORMAL LOW (ref 12–46)
Lymphocytes Relative: 4 % — ABNORMAL LOW (ref 12–46)
Lymphs Abs: 1.1 10*3/uL (ref 0.7–4.0)
Lymphs Abs: 1.1 10*3/uL (ref 0.7–4.0)
MCH: 28.8 pg (ref 26.0–34.0)
MCH: 29.1 pg (ref 26.0–34.0)
MCHC: 33.7 g/dL (ref 30.0–36.0)
MCHC: 34.1 g/dL (ref 30.0–36.0)
MCV: 85.4 fL (ref 78.0–100.0)
MCV: 85.5 fL (ref 78.0–100.0)
MONO ABS: 2.9 10*3/uL — AB (ref 0.1–1.0)
MONO ABS: 3.8 10*3/uL — AB (ref 0.1–1.0)
Monocytes Relative: 14 % — ABNORMAL HIGH (ref 3–12)
Monocytes Relative: 11 % (ref 3–12)
NEUTROS ABS: 22.3 10*3/uL — AB (ref 1.7–7.7)
NEUTROS PCT: 85 % — AB (ref 43–77)
Neutro Abs: 21.9 10*3/uL — ABNORMAL HIGH (ref 1.7–7.7)
Neutrophils Relative %: 82 % — ABNORMAL HIGH (ref 43–77)
PLATELETS: 335 10*3/uL (ref 150–400)
PLATELETS: 337 10*3/uL (ref 150–400)
RBC: 3.99 MIL/uL (ref 3.87–5.11)
RBC: 4.12 MIL/uL (ref 3.87–5.11)
RDW: 13.7 % (ref 11.5–15.5)
RDW: 13.6 % (ref 11.5–15.5)
WBC: 26.8 10*3/uL — ABNORMAL HIGH (ref 4.0–10.5)
WBC: 26.3 10*3/uL — ABNORMAL HIGH (ref 4.0–10.5)

## 2014-10-15 LAB — COMPREHENSIVE METABOLIC PANEL
ALT: 18 U/L (ref 0–35)
AST: 35 U/L (ref 0–37)
Albumin: 2.9 g/dL — ABNORMAL LOW (ref 3.5–5.2)
Alkaline Phosphatase: 56 U/L (ref 39–117)
Anion gap: 14 (ref 5–15)
BILIRUBIN TOTAL: 0.7 mg/dL (ref 0.3–1.2)
BUN: 20 mg/dL (ref 6–23)
CHLORIDE: 97 meq/L (ref 96–112)
CO2: 27 mmol/L (ref 19–32)
Calcium: 9.4 mg/dL (ref 8.4–10.5)
Creatinine, Ser: 0.64 mg/dL (ref 0.50–1.10)
GFR calc Af Amer: 89 mL/min — ABNORMAL LOW (ref >90)
GFR calc non Af Amer: 77 mL/min — ABNORMAL LOW (ref >90)
Glucose, Bld: 110 mg/dL — ABNORMAL HIGH (ref 70–99)
POTASSIUM: 3.4 mmol/L — AB (ref 3.5–5.1)
SODIUM: 138 mmol/L (ref 135–145)
TOTAL PROTEIN: 5.8 g/dL — AB (ref 6.0–8.3)

## 2014-10-15 LAB — URINE MICROSCOPIC-ADD ON

## 2014-10-15 LAB — URINALYSIS, ROUTINE W REFLEX MICROSCOPIC
Glucose, UA: NEGATIVE mg/dL
Hgb urine dipstick: NEGATIVE
Ketones, ur: 15 mg/dL — AB
NITRITE: NEGATIVE
Protein, ur: NEGATIVE mg/dL
SPECIFIC GRAVITY, URINE: 1.023 (ref 1.005–1.030)
Urobilinogen, UA: 0.2 mg/dL (ref 0.0–1.0)
pH: 5.5 (ref 5.0–8.0)

## 2014-10-15 LAB — TROPONIN I: TROPONIN I: 0.03 ng/mL (ref <0.031)

## 2014-10-15 LAB — LACTIC ACID, PLASMA: Lactic Acid, Venous: 1.6 mmol/L (ref 0.5–2.2)

## 2014-10-15 LAB — POC OCCULT BLOOD, ED: Fecal Occult Bld: POSITIVE — AB

## 2014-10-15 LAB — I-STAT CG4 LACTIC ACID, ED: Lactic Acid, Venous: 0.58 mmol/L (ref 0.5–2.2)

## 2014-10-15 LAB — CLOSTRIDIUM DIFFICILE BY PCR: Toxigenic C. Difficile by PCR: NEGATIVE

## 2014-10-15 MED ORDER — METRONIDAZOLE IN NACL 5-0.79 MG/ML-% IV SOLN
500.0000 mg | Freq: Three times a day (TID) | INTRAVENOUS | Status: DC
  Administered 2014-10-15 – 2014-10-20 (×15): 500 mg via INTRAVENOUS
  Filled 2014-10-15 (×18): qty 100

## 2014-10-15 MED ORDER — CIPROFLOXACIN IN D5W 400 MG/200ML IV SOLN
400.0000 mg | Freq: Once | INTRAVENOUS | Status: AC
  Administered 2014-10-15: 400 mg via INTRAVENOUS
  Filled 2014-10-15: qty 200

## 2014-10-15 MED ORDER — METRONIDAZOLE IN NACL 5-0.79 MG/ML-% IV SOLN
500.0000 mg | Freq: Once | INTRAVENOUS | Status: AC
  Administered 2014-10-15: 500 mg via INTRAVENOUS
  Filled 2014-10-15: qty 100

## 2014-10-15 MED ORDER — MORPHINE SULFATE 2 MG/ML IJ SOLN
0.5000 mg | INTRAMUSCULAR | Status: DC | PRN
  Administered 2014-10-15 – 2014-10-16 (×6): 0.5 mg via INTRAVENOUS
  Filled 2014-10-15 (×8): qty 1

## 2014-10-15 MED ORDER — POTASSIUM CHLORIDE IN NACL 20-0.9 MEQ/L-% IV SOLN
INTRAVENOUS | Status: AC
  Administered 2014-10-15 (×2): via INTRAVENOUS
  Filled 2014-10-15 (×4): qty 1000

## 2014-10-15 MED ORDER — METRONIDAZOLE IN NACL 5-0.79 MG/ML-% IV SOLN
500.0000 mg | Freq: Three times a day (TID) | INTRAVENOUS | Status: DC
  Administered 2014-10-15: 500 mg via INTRAVENOUS
  Filled 2014-10-15 (×3): qty 100

## 2014-10-15 MED ORDER — ONDANSETRON HCL 4 MG/2ML IJ SOLN: 4.0000 mg | Freq: Four times a day (QID) | INTRAMUSCULAR | Status: DC | PRN

## 2014-10-15 MED ORDER — SACCHAROMYCES BOULARDII 250 MG PO CAPS
250.0000 mg | ORAL_CAPSULE | Freq: Two times a day (BID) | ORAL | Status: DC
  Administered 2014-10-15 – 2014-10-20 (×11): 250 mg via ORAL
  Filled 2014-10-15 (×13): qty 1

## 2014-10-15 MED ORDER — FENOFIBRATE 54 MG PO TABS
54.0000 mg | ORAL_TABLET | Freq: Every day | ORAL | Status: DC
  Administered 2014-10-16 – 2014-10-20 (×5): 54 mg via ORAL
  Filled 2014-10-15 (×6): qty 1

## 2014-10-15 MED ORDER — IOHEXOL 300 MG/ML  SOLN
100.0000 mL | Freq: Once | INTRAMUSCULAR | Status: AC | PRN
  Administered 2014-10-15: 100 mL via INTRAVENOUS

## 2014-10-15 MED ORDER — AMLODIPINE BESYLATE 5 MG PO TABS
5.0000 mg | ORAL_TABLET | Freq: Every day | ORAL | Status: DC
  Administered 2014-10-15 – 2014-10-20 (×6): 5 mg via ORAL
  Filled 2014-10-15 (×6): qty 1

## 2014-10-15 MED ORDER — VANCOMYCIN 50 MG/ML ORAL SOLUTION
125.0000 mg | Freq: Four times a day (QID) | ORAL | Status: DC
  Filled 2014-10-15 (×4): qty 2.5

## 2014-10-15 MED ORDER — ACETAMINOPHEN 325 MG PO TABS: 650.0000 mg | ORAL_TABLET | Freq: Four times a day (QID) | ORAL | Status: DC | PRN

## 2014-10-15 MED ORDER — CIPROFLOXACIN IN D5W 400 MG/200ML IV SOLN
400.0000 mg | Freq: Two times a day (BID) | INTRAVENOUS | Status: DC
  Administered 2014-10-15 – 2014-10-20 (×10): 400 mg via INTRAVENOUS
  Filled 2014-10-15 (×13): qty 200

## 2014-10-15 MED ORDER — IOHEXOL 300 MG/ML  SOLN
25.0000 mL | Freq: Once | INTRAMUSCULAR | Status: AC | PRN
  Administered 2014-10-15: 25 mL via ORAL

## 2014-10-15 MED ORDER — ACETAMINOPHEN 650 MG RE SUPP: 650.0000 mg | Freq: Four times a day (QID) | RECTAL | Status: DC | PRN

## 2014-10-15 MED ORDER — POTASSIUM CHLORIDE CRYS ER 20 MEQ PO TBCR
20.0000 meq | EXTENDED_RELEASE_TABLET | Freq: Once | ORAL | Status: AC
  Administered 2014-10-15: 20 meq via ORAL
  Filled 2014-10-15: qty 1

## 2014-10-15 MED ORDER — CETYLPYRIDINIUM CHLORIDE 0.05 % MT LIQD
7.0000 mL | Freq: Two times a day (BID) | OROMUCOSAL | Status: DC
  Administered 2014-10-15 – 2014-10-18 (×8): 7 mL via OROMUCOSAL

## 2014-10-15 MED ORDER — ONDANSETRON HCL 4 MG PO TABS: 4.0000 mg | ORAL_TABLET | Freq: Four times a day (QID) | ORAL | Status: DC | PRN

## 2014-10-15 NOTE — ED Notes (Signed)
Phlebotomy at bedside.

## 2014-10-15 NOTE — Progress Notes (Signed)
NURSING PROGRESS NOTE  [name] [id number] Admission Data: 10/15/2014 6:36 AM Attending Provider: Rise Patience, MD PCP:No primary care provider on file. Code Status: (MD placing order)  Whitney Marquez is a 81 y.o. female patient admitted from ED:  -No acute distress noted.  -No complaints of shortness of breath.  -No complaints of chest pain.     Blood pressure 139/75, pulse 99, temperature 98.1 F (36.7 C), temperature source Oral, resp. rate 21, SpO2 91 %.   IV Fluids:  IV in place, occlusive dsg intact without redness, IV cath antecubital right, condition patent and no redness   Allergies:  Cephalosporins; Other; Macrobid; Penicillins; and Shellfish allergy  Past Medical History:   has a past medical history of Cancer; Cataracts, both eyes; Scoliosis; Aortic valve stenosis; and Plantar keratosis.  Past Surgical History:   has past surgical history that includes meniscus tear; Breast surgery; Colon surgery; Lumbar laminectomy; and Toe amputation.   Skin: intact. Blanchable redness to rectum area  Patient/Family orientated to room. Information packet given to patient/family. Admission inpatient armband information verified with patient/family to include name and date of birth and placed on patient arm. Side rails up x 2, fall assessment and education completed with patient/family. Patient/family able to verbalize understanding of risk associated with falls and verbalized understanding to call for assistance before getting out of bed. Call light within reach. Patient/family able to voice and demonstrate understanding of unit orientation instructions.    Will continue to evaluate and treat per MD orders.  Wallie Renshaw, RN

## 2014-10-15 NOTE — Progress Notes (Signed)
Report received from [facility] for patient to be admitted into [id number]

## 2014-10-15 NOTE — ED Provider Notes (Signed)
  Face-to-face evaluation   History: Patient with onset of abdominal pain today, associated with a fall, while at home.  Earlier today, she had seen an orthopedist for back pain and right knee pain.  Right knee pain was exacerbated in the fall today.  She was told to take MiraLAX, and developed diarrhea, today.  Physical exam: Alert, elderly female who is in mild discomfort.  Abdomen is distended.  Right knee is nontender to palpation in has normal range of motion.  There is no swelling of the right knee.  Medical screening examination/treatment/procedure(s) were conducted as a shared visit with non-physician practitioner(s) and myself.  I personally evaluated the patient during the encounter  Richarda Blade, MD 10/16/14 [phone]

## 2014-10-15 NOTE — ED Provider Notes (Signed)
Abd pain, nausea w/o vomiting - several days Started ultram recently - thought this was the cause Generalized weakness, fatigue - no fever Fell today (mechanical) - right knee, left hip - pending x-rays Looks dry - gen abd pain, decr. BS, no melena but guaiac pos.Stable hgb 26.8 WBC;  CMET, UA pending CT pending Plan - admit   CT shows colitis. With leukocytosis, weakness resulting in fall (new compression fx to lumbar spine). Discussed with [facility], Dr. Hal Hope, who accepts for admission.   Dewaine Oats, PA-C 10/15/14 [place], MD 10/16/14 [phone]

## 2014-10-15 NOTE — Consult Note (Signed)
Referring Provider: Dr. Clementeen Graham ([facility]) Primary Care Physician:  Dr. Lajean Manes  Primary Gastroenterologist:  Dr. Wynetta Emery  Reason for Consultation:  Colitis  HPI: Whitney Marquez is a 81 y.o. female admitted through the emergency room this morning because of a 1 day history of intermittent diarrhea and CT evidence of colitis.   The patient is remotely status post a right hemicolectomy in 1998 for colon cancer (her daughter also died of colon cancer), with her most recent surveillance examination, in January 2007, having been negative except for a few left-sided diverticula.   She fell yesterday and developed a lumbar fracture, and came to the hospital because associated pain but also reported a several day history of abdominal distention and bloating, some degree of low grade abdominal discomfort or low-grade pain, and diarrhea which has been nonbloody. No fevers, no vomiting, perhaps some nausea. The patient is not a very good historian. Her daughter, Francesca Jewett, is at the bedside. She provides much of the history.   In any event, a CT scan showed evidence of left-sided colitis, involving the transverse colon, descending colon and proximal sigmoid. She is Hemoccult positive but without frank bleeding. She has leukocytosis but no fever. She has been grossly stable overnight although a slight rise in hemoglobin (suggestive to me of third spacing) has been noted. She continues to have occasional small volume liquid brown stools.   Past Medical History  Diagnosis Date  . Cancer     breast and colon  . Cataracts, both eyes   . Scoliosis   . Aortic valve stenosis   . Plantar keratosis     Past Surgical History  Procedure Laterality Date  . Meniscus tear    . Breast surgery      breast cancer  . Colon surgery      colon cancer  . Lumbar laminectomy    . Toe amputation      second toe    Prior to Admission medications   Medication Sig Start Date End Date Taking?  Authorizing Provider  alendronate (FOSAMAX) 70 MG tablet Take 70 mg by mouth once a week. Take with a full glass of water on an empty stomach.   Yes Historical Provider, MD  amLODipine (NORVASC) 5 MG tablet Take 5 mg by mouth daily.   Yes Historical Provider, MD  aspirin EC 81 MG tablet Take 81 mg by mouth daily.   Yes Historical Provider, MD  Calcium-Vitamin D-Vitamin K 500-100-40 MG-UNT-MCG CHEW Chew 1 tablet by mouth daily.   Yes Historical Provider, MD  docusate sodium (COLACE) 100 MG capsule Take 100 mg by mouth daily as needed for mild constipation or moderate constipation.   Yes Historical Provider, MD  fenofibrate 54 MG tablet Take 54 mg by mouth daily.   Yes Historical Provider, MD  MELATONIN PO Take 1 tablet by mouth at bedtime as needed (for sleep).   Yes Historical Provider, MD  Multiple Vitamins-Minerals (CENTRUM SILVER PO) Take 1 tablet by mouth daily.   Yes Historical Provider, MD  polyethylene glycol (MIRALAX / GLYCOLAX) packet Take 17 g by mouth daily as needed for moderate constipation.   Yes Historical Provider, MD  Polyvinyl Alcohol-Povidone (REFRESH OP) Apply 1-2 drops to eye at bedtime as needed (for dry eyes).   Yes Historical Provider, MD  traMADol (ULTRAM) 50 MG tablet Take 50 mg by mouth every 6 (six) hours as needed for moderate pain or severe pain.   Yes Historical Provider, MD  omeprazole (PRILOSEC) 20 MG capsule  Take 20 mg by mouth daily.    Historical Provider, MD    Current Facility-Administered Medications  Medication Dose Route Frequency Provider Last Rate Last Dose  . 0.9 % NaCl with KCl 20 mEq/ L  infusion   Intravenous Continuous Rise Patience, MD 100 mL/hr at 10/15/14 0900    . acetaminophen (TYLENOL) tablet 650 mg  650 mg Oral Q6H PRN Rise Patience, MD       Or  . acetaminophen (TYLENOL) suppository 650 mg  650 mg Rectal Q6H PRN Rise Patience, MD      . amLODipine (NORVASC) tablet 5 mg  5 mg Oral Daily Rise Patience, MD   5 mg at  10/15/14 1148  . antiseptic oral rinse (CPC / CETYLPYRIDINIUM CHLORIDE 0.05%) solution 7 mL  7 mL Mouth Rinse BID Nishant Dhungel, MD      . ciprofloxacin (CIPRO) IVPB 400 mg  400 mg Intravenous Q12H Rise Patience, MD   400 mg at 10/15/14 0800  . fenofibrate tablet 54 mg  54 mg Oral Daily Rise Patience, MD   54 mg at 10/15/14 1130  . metroNIDAZOLE (FLAGYL) IVPB 500 mg  500 mg Intravenous Q8H Nishant Dhungel, MD      . morphine 2 MG/ML injection 0.5 mg  0.5 mg Intravenous Q4H PRN Rise Patience, MD   0.5 mg at 10/15/14 1224  . ondansetron (ZOFRAN) tablet 4 mg  4 mg Oral Q6H PRN Rise Patience, MD       Or  . ondansetron [facility]) injection 4 mg  4 mg Intravenous Q6H PRN Rise Patience, MD        Allergies as of 10/14/2014 - Review Complete 10/14/2014  Allergen Reaction Noted  . Cephalosporins  10/14/2014  . Macrobid [nitrofurantoin monohyd macro]  10/14/2014  . Penicillins  10/14/2014  . Shellfish allergy  10/14/2014    History reviewed. No pertinent family history.  History   Social History  . Marital Status: Unknown    Spouse Name: N/A    Number of Children: N/A  . Years of Education: N/A   Occupational History  . Not on file.   Social History Main Topics  . Smoking status: Never Smoker   . Smokeless tobacco: Not on file  . Alcohol Use: No  . Drug Use: Not on file  . Sexual Activity: Not on file   Other Topics Concern  . Not on file   Social History Narrative    Review of Systems: See history of present illness  Physical Exam: Vital signs in last 24 hours: Temp:  [98.1 F (36.7 C)-98.2 F (36.8 C)] 98.2 F (36.8 C) (01/14 0711) Pulse Rate:  [97-125] 125 (01/14 0711) Resp:  [16-25] 16 (01/14 0711) BP: (118-139)/(65-92) 130/70 mmHg (01/14 0711) SpO2:  [91 %-96 %] 92 % (01/14 0711) Weight:  [64.864 kg (143 lb)] 64.864 kg (143 lb) (01/14 1300) Last BM Date: 10/15/14 General:   Alert,  Well-developed, well-nourished, pleasant and  cooperative in NAD, hard of hearing Head:  Normocephalic and atraumatic. Eyes:  Sclera clear, no icterus.   Conjunctiva pink. Mouth:   No ulcerations or lesions.  Oropharynx shows dry mucous membranes Neck:   No masses or thyromegaly. Lungs:  Clear throughout to auscultation.   No wheezes, crackles, or rhonchi. No evident respiratory distress. Heart:   Regular rate and rhythm; no gallop, soft 2/6 systolic murmur at the upper left sternal border Abdomen:  Mildly protruberant, but soft, nontender, moderately tympanitic.  No masses, hepatosplenomegaly or ventral hernias noted.  Quiet bowel sounds, without bruits, guarding, or rebound.   Rectal:  Not performed. I did check her pad, which had liquid brown stool, approximately 100 ML's. She is known to be Hemoccult positive. Msk:   Symmetrical without gross deformities. Pulses:  Normal  radial pulse  noted. Extremities:   Without clubbing, cyanosis, or edema. Neurologic:  Alert and coherent;  grossly normal neurologically. Skin:  Intact without significant lesions or rashes. Cervical Nodes:  No significant cervical adenopathy. Psych:   Alert and cooperative. Normal mood and affect.  Intake/Output from previous day: 01/13 0701 - 01/14 0700 In: -  Out: 227 [Urine:225; Stool:2] Intake/Output this shift:    Lab Results:  Recent Labs  10/14/14 2337 10/15/14 0830  WBC 26.8* 26.3*  HGB 11.5* 12.0  HCT 34.1* 35.2*  PLT 335 337   BMET  Recent Labs  10/14/14 2337  NA 138  K 3.4*  CL 97  CO2 27  GLUCOSE 110*  BUN 20  CREATININE 0.64  CALCIUM 9.4   LFT  Recent Labs  10/14/14 2337  PROT 5.8*  ALBUMIN 2.9*  AST 35  ALT 18  ALKPHOS 56  BILITOT 0.7   PT/INR No results for input(s): LABPROT, INR in the last 72 hours.  Studies/Results: Dg Chest 2 View  10/15/2014   CLINICAL DATA:  Status post fall. Concern for chest injury. Initial encounter.  EXAM: CHEST  2 VIEW  COMPARISON:  None.  FINDINGS: The lungs are hypoexpanded.  There is elevation of the right hemidiaphragm. Vascular crowding and vascular congestion are seen. Minimal right mid lung opacity and left basilar opacity may reflect atelectasis or possibly pneumonia. There is no evidence of pleural effusion or pneumothorax.  The heart is borderline enlarged. There appears to be an acute compression fracture involving L1; cortical irregularity at the inferior endplate of L2 appears to reflect remote injury, on correlation with subsequent CT. Mild anterolisthesis at the mid cervical spine is thought to be chronic in nature. Clips are noted at the right axilla.  IMPRESSION: 1. Acute compression fracture involving the superior endplate of L1, noted on subsequent CT. 2. Lungs hypoexpanded, with elevation of the right hemidiaphragm. Minimal right mid lung and left basilar opacity may reflect atelectasis or possibly pneumonia. 3. No displaced rib fracture seen. 4. Mild vascular congestion and borderline cardiomegaly.   Electronically Signed   By: Garald Balding M.D.   On: 10/15/2014 03:14   Ct Abdomen Pelvis W Contrast  10/15/2014   CLINICAL DATA:  Acute onset of constipation for 3 days. Generalized weakness. Status post fall. Initial encounter.  EXAM: CT ABDOMEN AND PELVIS WITH CONTRAST  TECHNIQUE: Multidetector CT imaging of the abdomen and pelvis was performed using the standard protocol following bolus administration of intravenous contrast.  CONTRAST:  160mL OMNIPAQUE IOHEXOL 300 MG/ML  SOLN  COMPARISON:  None.  FINDINGS: Mild bibasilar atelectasis or scarring is noted. Scattered calcified mediastinal nodes likely reflect remote granulomatous disease. A tiny hiatal hernia is noted. Diffuse coronary artery calcifications are seen. There is dense calcification at the aortic and mitral valves.  There is no evidence of solid or hollow organ injury.  Decreased attenuation at medial hepatic dome is thought to be artifactual in nature. No definite hepatic laceration is seen. The spleen  is diminutive, with a single calcified granuloma noted. There is suggestion of a small stone at the base of the gallbladder. The gallbladder is otherwise unremarkable. The pancreas and adrenal glands are  unremarkable.  Mild nonspecific perinephric stranding is noted bilaterally. The kidneys are otherwise unremarkable. There is no evidence of hydronephrosis. No renal or ureteral stones are seen.  Mucosal edema and wall thickening is noted along the transverse, descending and proximal sigmoid colon, compatible with colitis. Associated soft tissue inflammation and trace free fluid is seen. There is no evidence of perforation or abscess formation at this time.  Contrast progresses to the level of the rectum. The sigmoid colon is somewhat redundant. Scattered diverticulosis is noted along the distal descending and proximal sigmoid colon.  The small bowel is unremarkable in appearance. The stomach is within normal limits. No acute vascular abnormalities are seen.  There is borderline aneurysmal dilatation of the infrarenal abdominal aorta, measuring 2.6 cm in AP dimension and 3.1 cm in transverse dimension. Diffuse calcification is seen along the abdominal aorta and its branches, including at the origins of the renal arteries bilaterally. Aneurysmal dilatation resolves proximal to the level of the aortic bifurcation.  The appendix is not definitely seen. There is no evidence of appendicitis.  The bladder is moderately distended and grossly unremarkable. The uterus is within normal limits. The ovaries are grossly symmetric. No suspicious adnexal masses are seen. A vaginal pessary is noted. A few calcified epiploic appendages are seen at the upper pelvis. No inguinal lymphadenopathy is seen.  There is an acute compression fracture involving the superior endplate of L1, with approximately 30% loss of height. There is approximately 5 mm of retropulsion. There is no evidence of extension to the posterior elements. There is  mild chronic compression deformity involving the inferior endplate of L2. Endplate sclerotic change is noted at the lower lumbar spine, with associated disc space narrowing. Vacuum phenomenon is noted at L2-L3 and L3-L4.  IMPRESSION: 1. Acute colitis involving the transverse, descending and proximal sigmoid colon, with mucosal edema and wall thickening, and surrounding soft tissue inflammation and trace fluid. No evidence of perforation or abscess formation at this time. This may be infectious or inflammatory in nature; there is no definite evidence of ischemia. 2. Acute compression fracture involving the superior endplate of L1, with approximately 30% loss of height. There is approximately 5 mm of retropulsion. No evidence of extension to the posterior elements. 3. Borderline aneurysmal dilatation of the infrarenal abdominal aorta, measuring 2.6 cm in AP dimension and 3.1 cm in transverse dimension. Diffuse calcification along the abdominal aorta and its branches, including at the origins of the renal arteries. Recommend followup by ultrasound in 3 years. This recommendation follows ACR consensus guidelines: White Paper of the ACR Incidental Findings Committee II on Vascular Findings. J Am Coll Radiol 2013; 91:638-466 4. Diffuse coronary artery calcifications noted. Dense calcification at the aortic and mitral valves. 5. Cholelithiasis; gallbladder otherwise unremarkable. 6. Scattered diverticulosis along the distal descending and proximal sigmoid colon, without evidence of diverticulitis. 7. Mild bibasilar atelectasis or scarring noted. Scattered calcified mediastinal nodes likely reflect remote granulomatous disease. 8. Mild degenerative change noted along the lower lumbar spine.   Electronically Signed   By: Garald Balding M.D.   On: 10/15/2014 03:32   Dg Knee Complete 4 Views Right  10/15/2014   CLINICAL DATA:  Status post fall, with anterior right knee pain. Initial encounter.  EXAM: RIGHT KNEE - COMPLETE  4+ VIEW  COMPARISON:  None.  FINDINGS: There is no evidence of fracture or dislocation. The joint spaces are preserved. Small marginal osteophytes are seen at the lateral compartment. There is minimal cortical irregularity at the upper pole  of the patella. A fabella is noted.  No significant joint effusion is seen. Scattered vascular calcifications are seen.  IMPRESSION: 1. No evidence of fracture or dislocation. 2. Minimal degenerative change at the lateral compartment. 3. Scattered vascular calcifications seen.   Electronically Signed   By: Garald Balding M.D.   On: 10/15/2014 03:04   Dg Hip Unilat With Pelvis 2-3 Views Left  10/15/2014   CLINICAL DATA:  Status post fall, with acute onset of left posterior hip pain. Initial encounter.  EXAM: DG HIP W/ PELVIS 2-3V*L*  COMPARISON:  None.  FINDINGS: There is no evidence of fracture or dislocation. Both femoral heads are seated normally within their respective acetabula. The proximal left femur appears intact. Disc space narrowing is noted at L5-S1, with associated endplate sclerotic change. The sacroiliac joints are unremarkable in appearance.  Contrast is noted within the distal small bowel and cecum.  IMPRESSION: No evidence of fracture or dislocation.   Electronically Signed   By: Garald Balding M.D.   On: 10/15/2014 03:08    Impression: 1. Acute colitis. Given the segmental distribution and the patient's known vascular disease based on CT vascular calcifications, I would suspect that this is probably ischemic colitis. Clinically, the patient does not seem to be severely compromised by it, although the significant leukocytosis is somewhat worrisome, as is the rise in hemoglobin suggesting significant bowel wall edema may have occurred. 2. Status post right hemicolectomy for colon cancer approximately 18 years ago, most recent colonoscopy 8 years ago  Plan: The patient does not appear to have gangrene, based on soft and nontender abdomen and absence of  clinical instability. Therefore, I would favor observation, and recommended this to the patient's daughter who is at the bedside, and who is a Marine scientist. She is in total agreement. If the patient's clinical evolution his unfavorable, as evidenced by development of fever or SIRS, or progressive leukocytosis, or progressive abdominal distention, consideration may have to be given to colonoscopic evaluation to look for evidence of gangrene, and/or surgical consultation. For now, however, with her tortuous colon (based on CT findings) I'm concerned that the exam would be somewhat technically difficult and may be more likely to cause problems than to remedy them.   LOS: 1 day   Teriann Livingood V  10/15/2014, 2:13 PM

## 2014-10-15 NOTE — Care Management Note (Signed)
    Page 1 of 2   10/20/2014     2:37:27 PM CARE MANAGEMENT NOTE 10/20/2014  Patient:  Whitney Marquez, Whitney Marquez   Account Number:  [id number]  Date Initiated:  10/15/2014  Documentation initiated by:  Tomi Bamberger  Subjective/Objective Assessment:   dx colits, sirs, diarrhea  admit- from home.     Action/Plan:   pt eval- rec hhpt   Anticipated DC Date:  10/20/2014   Anticipated DC Plan:  ASSISTED LIVING / [place]  CM consult      [facility] Choice  HOME HEALTH   Choice offered to / List presented to:  C-1 Patient   DME arranged  Vassie Moselle      DME agency  [place] arranged  [place] agency  OTHER - SEE NOTE   Status of service:  Completed, signed off Medicare Important Message given?  YES (If response is "NO", the following Medicare IM given date fields will be blank) Date Medicare IM given:  10/16/2014 Medicare IM given by:  Tomi Bamberger Date Additional Medicare IM given:  10/19/2014 Additional Medicare IM given by:  Tomi Bamberger  Discharge Disposition:  ASSISTED LIVING  Per UR Regulation:  Reviewed for med. necessity/level of care/duration of stay  If discussed at [place] of Stay Meetings, dates discussed:    Comments:  10/20/2014 [facility] provides PT at their facility. Orders added to dc paperwork to ALF.  Rolling walker ordered and Pilar Plate is bringing it to patient's room. Jonnie Finner RN CCM Case Mgmt phone [phone]  10/15/14 [place], BSN [phone]  patient is from home, NCM will cont to follow for dc needs.

## 2014-10-15 NOTE — ED Notes (Signed)
Pt finished drinking oral contrast. CT notified.  

## 2014-10-15 NOTE — Progress Notes (Signed)
ANTIBIOTIC CONSULT NOTE - INITIAL  Pharmacy Consult for Cipro Indication: intra-abdominal coverage  Allergies  Allergen Reactions  . Cephalosporins Anaphylaxis  . Other Swelling    Walnuts causes lips swelling  . Macrobid [Nitrofurantoin Monohyd Macro] Swelling and Rash  . Penicillins Hives and Rash  . Shellfish Allergy Rash    Patient Measurements: Height: 5' 4.5" (163.8 cm) (estimated by patient) Weight: 143 lb (64.864 kg) (estimated by patient) IBW/kg (Calculated) : 55.85  Vital Signs: Temp: 98.2 F (36.8 C) (01/14 0711) Temp Source: Oral (01/14 0711) BP: 130/70 mmHg (01/14 0711) Pulse Rate: 125 (01/14 0711)  Labs:  Recent Labs  10/14/14 2337 10/15/14 0830  WBC 26.8* 26.3*  HGB 11.5* 12.0  PLT 335 337  CREATININE 0.64  --    Estimated Creatinine Clearance: 42.1 mL/min (by C-G formula based on Cr of 0.64).   Microbiology: Recent Results (from the past 720 hour(s))  Clostridium Difficile by PCR     Status: None   Collection Time: 10/15/14  6:42 AM  Result Value Ref Range Status   C difficile by pcr NEGATIVE NEGATIVE Final    Medical History: Past Medical History  Diagnosis Date  . Cancer     breast and colon  . Cataracts, both eyes   . Scoliosis   . Aortic valve stenosis   . Plantar keratosis    Assessment:   Day # 1 Cipro and Flagyl for colitis. Afebrile, WBC 26.3   Cipro 400 mg IV q12hrs begun ~6am today.  No adjustment needed unless crcl < 30 ml/min.   FOB+ on admit.  Was on Omeprazole at home.   C diff PCR negative.  Urine culture pending.  Goal of Therapy:  appropriate Cipro dose for renal function and infection  Plan:   Continue Cipro 400 mg IV q12hrs.   Also on Flagyl 500 mg IV q8hrs.  Will follow renal function, culture data, progress.   Resume PPI? Inpatient substitute is Protonix.  Arty Baumgartner, [place] Pager: [phone] 10/15/2014,1:42 PM

## 2014-10-15 NOTE — H&P (Addendum)
[facility] History and Physical  Whitney Marquez VHQ:[id number] DOB: 01-22-1923 DOA: 10/14/2014  Referring physician: ER physician. PCP: No primary care provider on file.   Chief Complaint: Fall and diarrhea.  HPI: Whitney Marquez is a 81 y.o. female with history of hypertension, hyperlipidemia and previous history of colon cancer in remission, breast cancer in remission was brought to the ER after patient had a fall. Patient had gone to her orthopedic surgeon yesterday to evaluate her right knee. She complained of some constipation and was given laxative. Following which patient started having diarrhea. Patient had multiple episodes of diarrhea and fell in the bathroom. Patient was brought to the ER and in the ER CT abdomen and pelvis was done which showed L1 compression fracture acute with colitis involving the transverse descending and proximal sigmoid. Patient denies having taken any recent antibiotics or having been recently admitted to the hospital. Denies any nausea vomiting. Has some left lower quadrant pain. Patient has been admitted for further management. Patient denies any chest pain or shortness of breath.   Review of Systems: As presented in the history of presenting illness, rest negative.  Past Medical History  Diagnosis Date  . Cancer     breast and colon  . Cataracts, both eyes   . Scoliosis   . Aortic valve stenosis   . Plantar keratosis    Past Surgical History  Procedure Laterality Date  . Meniscus tear    . Breast surgery      breast cancer  . Colon surgery      colon cancer  . Lumbar laminectomy    . Toe amputation      second toe   Social History:  reports that she has never smoked. She does not have any smokeless tobacco history on file. She reports that she does not drink alcohol. Her drug history is not on file. Where does patient live home. Can patient participate in ADLs? Yes.  Allergies  Allergen Reactions  . Cephalosporins Anaphylaxis  .  Other Swelling    Walnuts causes lips swelling  . Macrobid [Nitrofurantoin Monohyd Macro] Swelling and Rash  . Penicillins Hives and Rash  . Shellfish Allergy Rash    Family History: No family history on file.    Prior to Admission medications   Medication Sig Start Date End Date Taking? Authorizing Provider  alendronate (FOSAMAX) 70 MG tablet Take 70 mg by mouth once a week. Take with a full glass of water on an empty stomach.   Yes Historical Provider, MD  amLODipine (NORVASC) 5 MG tablet Take 5 mg by mouth daily.   Yes Historical Provider, MD  aspirin EC 81 MG tablet Take 81 mg by mouth daily.   Yes Historical Provider, MD  Calcium-Vitamin D-Vitamin K 500-100-40 MG-UNT-MCG CHEW Chew 1 tablet by mouth daily.   Yes Historical Provider, MD  docusate sodium (COLACE) 100 MG capsule Take 100 mg by mouth daily as needed for mild constipation or moderate constipation.   Yes Historical Provider, MD  fenofibrate 54 MG tablet Take 54 mg by mouth daily.   Yes Historical Provider, MD  MELATONIN PO Take 1 tablet by mouth at bedtime as needed (for sleep).   Yes Historical Provider, MD  Multiple Vitamins-Minerals (CENTRUM SILVER PO) Take 1 tablet by mouth daily.   Yes Historical Provider, MD  polyethylene glycol (MIRALAX / GLYCOLAX) packet Take 17 g by mouth daily as needed for moderate constipation.   Yes Historical Provider, MD  Polyvinyl Alcohol-Povidone (REFRESH OP) Apply  1-2 drops to eye at bedtime as needed (for dry eyes).   Yes Historical Provider, MD  traMADol (ULTRAM) 50 MG tablet Take 50 mg by mouth every 6 (six) hours as needed for moderate pain or severe pain.   Yes Historical Provider, MD  omeprazole (PRILOSEC) 20 MG capsule Take 20 mg by mouth daily.    Historical Provider, MD    Physical Exam: Filed Vitals:   10/15/14 0324 10/15/14 0330 10/15/14 0400 10/15/14 0430  BP:  139/66 134/82 139/75  Pulse:  100 102 99  Temp:      TempSrc:      Resp:      SpO2: 95% 93% 92% 91%      General:  Moderately built and nourished.  Eyes: Anicteric no pallor.  ENT: No discharge from the ears eyes nose and mouth.  Neck: No mass felt.  Cardiovascular: S1 and S2 heard.  Respiratory: No rhonchi or crepitations.  Abdomen: Soft mild left lower quadrant tenderness no guarding or rigidity.  Skin: No rash.  Musculoskeletal: No edema. Patient has mild swelling in the right knee and pain in the back when patient raises her leg.  Psychiatric: Appears normal.  Neurologic: Alert awake oriented to time place and person. Moves all extremities.  Labs on Admission:  Basic Metabolic Panel:  Recent Labs Lab 10/14/14 2337  NA 138  K 3.4*  CL 97  CO2 27  GLUCOSE 110*  BUN 20  CREATININE 0.64  CALCIUM 9.4   Liver Function Tests:  Recent Labs Lab 10/14/14 2337  AST 35  ALT 18  ALKPHOS 56  BILITOT 0.7  PROT 5.8*  ALBUMIN 2.9*   No results for input(s): LIPASE, AMYLASE in the last 168 hours. No results for input(s): AMMONIA in the last 168 hours. CBC:  Recent Labs Lab 10/14/14 2337  WBC 26.8*  NEUTROABS 21.9*  HGB 11.5*  HCT 34.1*  MCV 85.5  PLT 335   Cardiac Enzymes:  Recent Labs Lab 10/14/14 2337  TROPONINI 0.03    BNP (last 3 results) No results for input(s): PROBNP in the last 8760 hours. CBG: No results for input(s): GLUCAP in the last 168 hours.  Radiological Exams on Admission: Dg Chest 2 View  10/15/2014   CLINICAL DATA:  Status post fall. Concern for chest injury. Initial encounter.  EXAM: CHEST  2 VIEW  COMPARISON:  None.  FINDINGS: The lungs are hypoexpanded. There is elevation of the right hemidiaphragm. Vascular crowding and vascular congestion are seen. Minimal right mid lung opacity and left basilar opacity may reflect atelectasis or possibly pneumonia. There is no evidence of pleural effusion or pneumothorax.  The heart is borderline enlarged. There appears to be an acute compression fracture involving L1; cortical irregularity  at the inferior endplate of L2 appears to reflect remote injury, on correlation with subsequent CT. Mild anterolisthesis at the mid cervical spine is thought to be chronic in nature. Clips are noted at the right axilla.  IMPRESSION: 1. Acute compression fracture involving the superior endplate of L1, noted on subsequent CT. 2. Lungs hypoexpanded, with elevation of the right hemidiaphragm. Minimal right mid lung and left basilar opacity may reflect atelectasis or possibly pneumonia. 3. No displaced rib fracture seen. 4. Mild vascular congestion and borderline cardiomegaly.   Electronically Signed   By: Garald Balding M.D.   On: 10/15/2014 03:14   Ct Abdomen Pelvis W Contrast  10/15/2014   CLINICAL DATA:  Acute onset of constipation for 3 days. Generalized weakness. Status post  fall. Initial encounter.  EXAM: CT ABDOMEN AND PELVIS WITH CONTRAST  TECHNIQUE: Multidetector CT imaging of the abdomen and pelvis was performed using the standard protocol following bolus administration of intravenous contrast.  CONTRAST:  111mL OMNIPAQUE IOHEXOL 300 MG/ML  SOLN  COMPARISON:  None.  FINDINGS: Mild bibasilar atelectasis or scarring is noted. Scattered calcified mediastinal nodes likely reflect remote granulomatous disease. A tiny hiatal hernia is noted. Diffuse coronary artery calcifications are seen. There is dense calcification at the aortic and mitral valves.  There is no evidence of solid or hollow organ injury.  Decreased attenuation at medial hepatic dome is thought to be artifactual in nature. No definite hepatic laceration is seen. The spleen is diminutive, with a single calcified granuloma noted. There is suggestion of a small stone at the base of the gallbladder. The gallbladder is otherwise unremarkable. The pancreas and adrenal glands are unremarkable.  Mild nonspecific perinephric stranding is noted bilaterally. The kidneys are otherwise unremarkable. There is no evidence of hydronephrosis. No renal or ureteral  stones are seen.  Mucosal edema and wall thickening is noted along the transverse, descending and proximal sigmoid colon, compatible with colitis. Associated soft tissue inflammation and trace free fluid is seen. There is no evidence of perforation or abscess formation at this time.  Contrast progresses to the level of the rectum. The sigmoid colon is somewhat redundant. Scattered diverticulosis is noted along the distal descending and proximal sigmoid colon.  The small bowel is unremarkable in appearance. The stomach is within normal limits. No acute vascular abnormalities are seen.  There is borderline aneurysmal dilatation of the infrarenal abdominal aorta, measuring 2.6 cm in AP dimension and 3.1 cm in transverse dimension. Diffuse calcification is seen along the abdominal aorta and its branches, including at the origins of the renal arteries bilaterally. Aneurysmal dilatation resolves proximal to the level of the aortic bifurcation.  The appendix is not definitely seen. There is no evidence of appendicitis.  The bladder is moderately distended and grossly unremarkable. The uterus is within normal limits. The ovaries are grossly symmetric. No suspicious adnexal masses are seen. A vaginal pessary is noted. A few calcified epiploic appendages are seen at the upper pelvis. No inguinal lymphadenopathy is seen.  There is an acute compression fracture involving the superior endplate of L1, with approximately 30% loss of height. There is approximately 5 mm of retropulsion. There is no evidence of extension to the posterior elements. There is mild chronic compression deformity involving the inferior endplate of L2. Endplate sclerotic change is noted at the lower lumbar spine, with associated disc space narrowing. Vacuum phenomenon is noted at L2-L3 and L3-L4.  IMPRESSION: 1. Acute colitis involving the transverse, descending and proximal sigmoid colon, with mucosal edema and wall thickening, and surrounding soft tissue  inflammation and trace fluid. No evidence of perforation or abscess formation at this time. This may be infectious or inflammatory in nature; there is no definite evidence of ischemia. 2. Acute compression fracture involving the superior endplate of L1, with approximately 30% loss of height. There is approximately 5 mm of retropulsion. No evidence of extension to the posterior elements. 3. Borderline aneurysmal dilatation of the infrarenal abdominal aorta, measuring 2.6 cm in AP dimension and 3.1 cm in transverse dimension. Diffuse calcification along the abdominal aorta and its branches, including at the origins of the renal arteries. Recommend followup by ultrasound in 3 years. This recommendation follows ACR consensus guidelines: White Paper of the ACR Incidental Findings Committee II on Vascular Findings.  J Am Coll Radiol 2013; 41:740-814 4. Diffuse coronary artery calcifications noted. Dense calcification at the aortic and mitral valves. 5. Cholelithiasis; gallbladder otherwise unremarkable. 6. Scattered diverticulosis along the distal descending and proximal sigmoid colon, without evidence of diverticulitis. 7. Mild bibasilar atelectasis or scarring noted. Scattered calcified mediastinal nodes likely reflect remote granulomatous disease. 8. Mild degenerative change noted along the lower lumbar spine.   Electronically Signed   By: Garald Balding M.D.   On: 10/15/2014 03:32   Dg Knee Complete 4 Views Right  10/15/2014   CLINICAL DATA:  Status post fall, with anterior right knee pain. Initial encounter.  EXAM: RIGHT KNEE - COMPLETE 4+ VIEW  COMPARISON:  None.  FINDINGS: There is no evidence of fracture or dislocation. The joint spaces are preserved. Small marginal osteophytes are seen at the lateral compartment. There is minimal cortical irregularity at the upper pole of the patella. A fabella is noted.  No significant joint effusion is seen. Scattered vascular calcifications are seen.  IMPRESSION: 1. No  evidence of fracture or dislocation. 2. Minimal degenerative change at the lateral compartment. 3. Scattered vascular calcifications seen.   Electronically Signed   By: Garald Balding M.D.   On: 10/15/2014 03:04   Dg Hip Unilat With Pelvis 2-3 Views Left  10/15/2014   CLINICAL DATA:  Status post fall, with acute onset of left posterior hip pain. Initial encounter.  EXAM: DG HIP W/ PELVIS 2-3V*L*  COMPARISON:  None.  FINDINGS: There is no evidence of fracture or dislocation. Both femoral heads are seated normally within their respective acetabula. The proximal left femur appears intact. Disc space narrowing is noted at L5-S1, with associated endplate sclerotic change. The sacroiliac joints are unremarkable in appearance.  Contrast is noted within the distal small bowel and cecum.  IMPRESSION: No evidence of fracture or dislocation.   Electronically Signed   By: Garald Balding M.D.   On: 10/15/2014 03:08     Assessment/Plan Active Problems:   Colitis   1. Colitis - concerning for ischemic versus infectious. At this time I have ordered a lactic acid levels and we will continue with IV fluid hydration and place patient on Cipro and Flagyl and check stool studies. Patient's stool for occult blood positive. Closely follow CBC. 2. L1 acute compression fracture status post fall - patient knows Dr. Noralee Chars. At this time I have placed patient on pain relief and physical therapy consult. May consult orthopedics for further recommendation. Last colonoscopy in 2007. 3. SIRS from #1. 4. Anemia with fecal occult blood positive - closely follow CBC. 5. Hypertension - continue amlodipine. 6. Hyperlipidemia - on fenofibrate. 7. History of breast cancer and colon cancer in remission. 8. Right knee osteoarthritis.   DVT ProphylaxisSCDs as patient has fecal occult blood positive.  Code Status: Full code.  Family Communication: Patient's daughters at the bedside.  Disposition Plan: Admit to inpatient.     Whitney Marquez N. [facility] Pager [phone].  If 7PM-7AM, please contact night-coverage [email or web address] Password [facility] 10/15/2014, 6:56 AM

## 2014-10-15 NOTE — Progress Notes (Signed)
[facility] PROGRESS NOTE  Whitney Marquez MVH:[id number] DOB: 07-16-1923 DOA: 10/14/2014 PCP: No primary care provider on file.  Assessment/Plan: SIRS with Acute diffuse colitis Inflammatory versus ischemic. Stool for C. difficile negative.  Continue empiric ciprofloxacin and Flagyl. Pain control with when necessary IV morphine. Continue IV hydration with normal saline. -Serial abdominal exam. -[facility] consulted. -Still has significant leukocytosis. Lactic acid negative. -Follow GI pathogen panel -clear liquids for now.  L1 acute compression fracture Patient reports having a fall after returning from orthopedics clinic yesterday. Reviewed Dr. Aurea Graff office note. Patient has history of compressive lumbar fracture with degenerative disc disease. PT eval and pain control for now.  Anemia Mild with osteophytic occult blood. Monitor CBC.  History of colon cancer and breast cancer in remission Status post right hemicolectomy in late 1990s.  Hypertension Continue amlodipine  Right knee osteoarthritis Following with Dr. Alvan Dame  DVT prophylaxis: Subcutaneous Lovenox  Diet: Clear liquid    Code Status: full code Family Communication: Daughter is at bedside Disposition Plan: Currently inpatient   Consultants:  [facility]  Procedures:  ED abdomen and pelvis  Antibiotics:  Cipro and Flagyl  HPI/Subjective: Seen and examined. Still having ongoing watery diarrhea. No blood noted. Reports abdominal discomfort.  Objective: Filed Vitals:   10/15/14 0711  BP: 130/70  Pulse: 125  Temp: 98.2 F (36.8 C)  Resp: 16    Intake/Output Summary (Last 24 hours) at 10/15/14 1302 Last data filed at 10/15/14 0546  Gross per 24 hour  Intake      0 ml  Output    227 ml  Net   -227 ml   There were no vitals filed for this visit.  Exam:   General:  Elderly female lying in bed in no acute distress  HEENT: No pallor, dry mucosa  Chest: Clear to loss of vision  bilaterally  CVS: Normal S1 and S2, no murmurs  Abdomen: Distended, bowel sounds present, left lower quadrant tenderness, no guarding or rigidity  Extremities: Warm, no edema  CNS: Alert and oriented Data Reviewed: Basic Metabolic Panel:  Recent Labs Lab 10/14/14 2337  NA 138  K 3.4*  CL 97  CO2 27  GLUCOSE 110*  BUN 20  CREATININE 0.64  CALCIUM 9.4   Liver Function Tests:  Recent Labs Lab 10/14/14 2337  AST 35  ALT 18  ALKPHOS 56  BILITOT 0.7  PROT 5.8*  ALBUMIN 2.9*   No results for input(s): LIPASE, AMYLASE in the last 168 hours. No results for input(s): AMMONIA in the last 168 hours. CBC:  Recent Labs Lab 10/14/14 2337 10/15/14 0830  WBC 26.8* 26.3*  NEUTROABS 21.9* 22.3*  HGB 11.5* 12.0  HCT 34.1* 35.2*  MCV 85.5 85.4  PLT 335 337   Cardiac Enzymes:  Recent Labs Lab 10/14/14 2337  TROPONINI 0.03   BNP (last 3 results) No results for input(s): PROBNP in the last 8760 hours. CBG: No results for input(s): GLUCAP in the last 168 hours.  Recent Results (from the past 240 hour(s))  Clostridium Difficile by PCR     Status: None   Collection Time: 10/15/14  6:42 AM  Result Value Ref Range Status   C difficile by pcr NEGATIVE NEGATIVE Final     Studies: Dg Chest 2 View  10/15/2014   CLINICAL DATA:  Status post fall. Concern for chest injury. Initial encounter.  EXAM: CHEST  2 VIEW  COMPARISON:  None.  FINDINGS: The lungs are hypoexpanded. There is elevation of the right  hemidiaphragm. Vascular crowding and vascular congestion are seen. Minimal right mid lung opacity and left basilar opacity may reflect atelectasis or possibly pneumonia. There is no evidence of pleural effusion or pneumothorax.  The heart is borderline enlarged. There appears to be an acute compression fracture involving L1; cortical irregularity at the inferior endplate of L2 appears to reflect remote injury, on correlation with subsequent CT. Mild anterolisthesis at the mid  cervical spine is thought to be chronic in nature. Clips are noted at the right axilla.  IMPRESSION: 1. Acute compression fracture involving the superior endplate of L1, noted on subsequent CT. 2. Lungs hypoexpanded, with elevation of the right hemidiaphragm. Minimal right mid lung and left basilar opacity may reflect atelectasis or possibly pneumonia. 3. No displaced rib fracture seen. 4. Mild vascular congestion and borderline cardiomegaly.   Electronically Signed   By: Garald Balding M.D.   On: 10/15/2014 03:14   Ct Abdomen Pelvis W Contrast  10/15/2014   CLINICAL DATA:  Acute onset of constipation for 3 days. Generalized weakness. Status post fall. Initial encounter.  EXAM: CT ABDOMEN AND PELVIS WITH CONTRAST  TECHNIQUE: Multidetector CT imaging of the abdomen and pelvis was performed using the standard protocol following bolus administration of intravenous contrast.  CONTRAST:  122mL OMNIPAQUE IOHEXOL 300 MG/ML  SOLN  COMPARISON:  None.  FINDINGS: Mild bibasilar atelectasis or scarring is noted. Scattered calcified mediastinal nodes likely reflect remote granulomatous disease. A tiny hiatal hernia is noted. Diffuse coronary artery calcifications are seen. There is dense calcification at the aortic and mitral valves.  There is no evidence of solid or hollow organ injury.  Decreased attenuation at medial hepatic dome is thought to be artifactual in nature. No definite hepatic laceration is seen. The spleen is diminutive, with a single calcified granuloma noted. There is suggestion of a small stone at the base of the gallbladder. The gallbladder is otherwise unremarkable. The pancreas and adrenal glands are unremarkable.  Mild nonspecific perinephric stranding is noted bilaterally. The kidneys are otherwise unremarkable. There is no evidence of hydronephrosis. No renal or ureteral stones are seen.  Mucosal edema and wall thickening is noted along the transverse, descending and proximal sigmoid colon, compatible  with colitis. Associated soft tissue inflammation and trace free fluid is seen. There is no evidence of perforation or abscess formation at this time.  Contrast progresses to the level of the rectum. The sigmoid colon is somewhat redundant. Scattered diverticulosis is noted along the distal descending and proximal sigmoid colon.  The small bowel is unremarkable in appearance. The stomach is within normal limits. No acute vascular abnormalities are seen.  There is borderline aneurysmal dilatation of the infrarenal abdominal aorta, measuring 2.6 cm in AP dimension and 3.1 cm in transverse dimension. Diffuse calcification is seen along the abdominal aorta and its branches, including at the origins of the renal arteries bilaterally. Aneurysmal dilatation resolves proximal to the level of the aortic bifurcation.  The appendix is not definitely seen. There is no evidence of appendicitis.  The bladder is moderately distended and grossly unremarkable. The uterus is within normal limits. The ovaries are grossly symmetric. No suspicious adnexal masses are seen. A vaginal pessary is noted. A few calcified epiploic appendages are seen at the upper pelvis. No inguinal lymphadenopathy is seen.  There is an acute compression fracture involving the superior endplate of L1, with approximately 30% loss of height. There is approximately 5 mm of retropulsion. There is no evidence of extension to the posterior elements. There  is mild chronic compression deformity involving the inferior endplate of L2. Endplate sclerotic change is noted at the lower lumbar spine, with associated disc space narrowing. Vacuum phenomenon is noted at L2-L3 and L3-L4.  IMPRESSION: 1. Acute colitis involving the transverse, descending and proximal sigmoid colon, with mucosal edema and wall thickening, and surrounding soft tissue inflammation and trace fluid. No evidence of perforation or abscess formation at this time. This may be infectious or inflammatory in  nature; there is no definite evidence of ischemia. 2. Acute compression fracture involving the superior endplate of L1, with approximately 30% loss of height. There is approximately 5 mm of retropulsion. No evidence of extension to the posterior elements. 3. Borderline aneurysmal dilatation of the infrarenal abdominal aorta, measuring 2.6 cm in AP dimension and 3.1 cm in transverse dimension. Diffuse calcification along the abdominal aorta and its branches, including at the origins of the renal arteries. Recommend followup by ultrasound in 3 years. This recommendation follows ACR consensus guidelines: White Paper of the ACR Incidental Findings Committee II on Vascular Findings. J Am Coll Radiol 2013; 34:742-595 4. Diffuse coronary artery calcifications noted. Dense calcification at the aortic and mitral valves. 5. Cholelithiasis; gallbladder otherwise unremarkable. 6. Scattered diverticulosis along the distal descending and proximal sigmoid colon, without evidence of diverticulitis. 7. Mild bibasilar atelectasis or scarring noted. Scattered calcified mediastinal nodes likely reflect remote granulomatous disease. 8. Mild degenerative change noted along the lower lumbar spine.   Electronically Signed   By: Garald Balding M.D.   On: 10/15/2014 03:32   Dg Knee Complete 4 Views Right  10/15/2014   CLINICAL DATA:  Status post fall, with anterior right knee pain. Initial encounter.  EXAM: RIGHT KNEE - COMPLETE 4+ VIEW  COMPARISON:  None.  FINDINGS: There is no evidence of fracture or dislocation. The joint spaces are preserved. Small marginal osteophytes are seen at the lateral compartment. There is minimal cortical irregularity at the upper pole of the patella. A fabella is noted.  No significant joint effusion is seen. Scattered vascular calcifications are seen.  IMPRESSION: 1. No evidence of fracture or dislocation. 2. Minimal degenerative change at the lateral compartment. 3. Scattered vascular calcifications seen.    Electronically Signed   By: Garald Balding M.D.   On: 10/15/2014 03:04   Dg Hip Unilat With Pelvis 2-3 Views Left  10/15/2014   CLINICAL DATA:  Status post fall, with acute onset of left posterior hip pain. Initial encounter.  EXAM: DG HIP W/ PELVIS 2-3V*L*  COMPARISON:  None.  FINDINGS: There is no evidence of fracture or dislocation. Both femoral heads are seated normally within their respective acetabula. The proximal left femur appears intact. Disc space narrowing is noted at L5-S1, with associated endplate sclerotic change. The sacroiliac joints are unremarkable in appearance.  Contrast is noted within the distal small bowel and cecum.  IMPRESSION: No evidence of fracture or dislocation.   Electronically Signed   By: Garald Balding M.D.   On: 10/15/2014 03:08    Scheduled Meds: . amLODipine  5 mg Oral Daily  . ciprofloxacin  400 mg Intravenous Q12H  . fenofibrate  54 mg Oral Daily  . metronidazole  500 mg Intravenous Q8H   Continuous Infusions: . 0.9 % NaCl with KCl 20 mEq / L 100 mL/hr at 10/15/14 0900       Time spent: 25 minutes    Louellen Molder  [facility] Pager [phone] If 7PM-7AM, please contact night-coverage at [email or web address], password [facility] 10/15/2014, 1:02 PM  LOS: 1 day

## 2014-10-16 DIAGNOSIS — E876 Hypokalemia: Secondary | ICD-10-CM

## 2014-10-16 LAB — COMPREHENSIVE METABOLIC PANEL
ALK PHOS: 53 U/L (ref 39–117)
ALT: 16 U/L (ref 0–35)
ANION GAP: 7 (ref 5–15)
AST: 33 U/L (ref 0–37)
Albumin: 2 g/dL — ABNORMAL LOW (ref 3.5–5.2)
BILIRUBIN TOTAL: 0.3 mg/dL (ref 0.3–1.2)
BUN: 9 mg/dL (ref 6–23)
CHLORIDE: 103 meq/L (ref 96–112)
CO2: 24 mmol/L (ref 19–32)
Calcium: 7.3 mg/dL — ABNORMAL LOW (ref 8.4–10.5)
Creatinine, Ser: 0.51 mg/dL (ref 0.50–1.10)
GFR calc Af Amer: >90 mL/min (ref >90)
GFR calc non Af Amer: 83 mL/min — ABNORMAL LOW (ref >90)
Glucose, Bld: 120 mg/dL — ABNORMAL HIGH (ref 70–99)
POTASSIUM: 3.2 mmol/L — AB (ref 3.5–5.1)
SODIUM: 134 mmol/L — AB (ref 135–145)
Total Protein: 4.4 g/dL — ABNORMAL LOW (ref 6.0–8.3)

## 2014-10-16 LAB — URINE CULTURE
Colony Count: NO GROWTH
Culture: NO GROWTH

## 2014-10-16 LAB — CBC
HCT: 32.3 % — ABNORMAL LOW (ref 36.0–46.0)
HEMOGLOBIN: 10.6 g/dL — AB (ref 12.0–15.0)
MCH: 28.6 pg (ref 26.0–34.0)
MCHC: 32.8 g/dL (ref 30.0–36.0)
MCV: 87.1 fL (ref 78.0–100.0)
PLATELETS: 329 10*3/uL (ref 150–400)
RBC: 3.71 MIL/uL — AB (ref 3.87–5.11)
RDW: 13.8 % (ref 11.5–15.5)
WBC: 22.3 10*3/uL — ABNORMAL HIGH (ref 4.0–10.5)

## 2014-10-16 MED ORDER — SODIUM CHLORIDE 0.9 % IV SOLN
INTRAVENOUS | Status: DC
  Administered 2014-10-16 – 2014-10-20 (×5): via INTRAVENOUS
  Filled 2014-10-16 (×12): qty 1000

## 2014-10-16 NOTE — Progress Notes (Signed)
Addendum to previous note: The patient should probably remain on clear liquids until tomorrow, but if doing somewhat better at that time, you might consider advancing her to a low fiber diet.  Cleotis Nipper, M.D. [phone]

## 2014-10-16 NOTE — Progress Notes (Signed)
MD notified due to acute change in patient status. Vital signs and labs are listed below. New manual radial pulse taken with result of 96. Responding MD:  Donnal Debar, NP Time MD responded: [id number] MD response: No action required at this time  Vital Signs Filed Vitals:   10/16/14 0526 10/16/14 1041 10/16/14 1406 10/16/14 2203  BP: 96/53 101/59 107/56 123/74  Pulse: 77  94 121  Temp: 97.9 F (36.6 C)  98.4 F (36.9 C) 98.2 F (36.8 C)  TempSrc: Oral  Oral Oral  Resp: 18  18 15   Height:      Weight:      SpO2: 90%  92% 95%     Lab Results WBC  Date/Time Value Ref Range Status  10/16/2014 07:00 AM 22.3* 4.0 - 10.5 K/uL Final  10/15/2014 08:30 AM 26.3* 4.0 - 10.5 K/uL Final  10/14/2014 11:37 PM 26.8* 4.0 - 10.5 K/uL Final    Comment:    REPEATED TO VERIFY   NEUTROPHILS RELATIVE %  Date/Time Value Ref Range Status  10/15/2014 08:30 AM 85* 43 - 77 % Final  10/14/2014 11:37 PM 82* 43 - 77 % Final   No results found for: PCO2ART LACTIC ACID, VENOUS  Date/Time Value Ref Range Status  10/15/2014 08:30 AM 1.6 0.5 - 2.2 mmol/L Final  10/15/2014 02:26 AM 0.58 0.5 - 2.2 mmol/L Final   No results found for: PCO2VEN   Parthenia Ames, RN 10/16/2014, 10:21 PM

## 2014-10-16 NOTE — Progress Notes (Signed)
[facility] PROGRESS NOTE  Whitney Marquez UDJ:[id number] DOB: 07/14/1923 DOA: 10/14/2014 PCP: No primary care provider on file.   Brief narrative 81 year old female with history of hypertension, hyperlipidemia, history of colon cancer status post right hemicolectomy about 16-17 years back, breast cancer in remission was brought to the ED after she had a fall and landed on her back. She had gone to see her orthopedic surgeon on the day of admission for right knee pain. She was taking some laxative with complains of constipation. Following this she had several episodes of diarrhea and was brought to the ED. In the ED a CT scan of the abdomen and pelvis showed acute colitis involving the transverse, descending in the proximal sigmoid colon. Also showed L1 compression fracture. Patient admitted for further evaluation and management.  Assessment/Plan: SIRS with Acute diffuse colitis -Likely to be ischemic.Marland Kitchen Stool for C. difficile negative.  Continue empiric ciprofloxacin and Flagyl. Pain control with when necessary IV morphine. Continue IV hydration with normal saline. -Serial abdominal exam. -Appreciate GI recommendation. Plan to monitor on observation for now. -Still has significant leukocytosis. Lactic acid negative. -Follow GI pathogen panel -Continue clear liquids. -Check abdominal x-ray in a.m.  L1 acute compression fracture Patient reports having a fall after returning from orthopedics clinic yesterday. Reviewed Dr. Aurea Graff office note. Patient has history of compressive lumbar fracture with degenerative disc disease. PT eval and pain control for now.  Anemia Mild with pos occult blood. Monitor CBC.  History of colon cancer and breast cancer in remission Status post right hemicolectomy in late 1990s.  Hypertension Continue amlodipine  Right knee osteoarthritis Following with Dr. Alvan Dame  DVT prophylaxis: Subcutaneous Lovenox  Diet: Clear liquid    Code Status: full  code Family Communication: son at bedside Disposition Plan: Currently inpatient   Consultants:  [facility]  Procedures:  CT abdomen and pelvis  Antibiotics:  Cipro and Flagyl  HPI/Subjective: Seen and examined. Still has several episodes of watery diarrhea. Abdominal distention unchanged but less tender. Afebrile.  Objective: Filed Vitals:   10/16/14 1406  BP: 107/56  Pulse: 94  Temp: 98.4 F (36.9 C)  Resp: 18    Intake/Output Summary (Last 24 hours) at 10/16/14 1454 Last data filed at 10/16/14 1451  Gross per 24 hour  Intake   3362 ml  Output    354 ml  Net   3008 ml   Filed Weights   10/15/14 1300  Weight: 64.864 kg (143 lb)    Exam:   General:  Elderly female lying in bed in no acute distress  HEENT: No pallor, dry mucosa  Chest: Clear bilaterally  CVS: Normal S1 and S2, no murmurs  Abdomen: Distended (unchanged), bowel sounds present, mild left lower quadrant tenderness, no guarding or rigidity  Extremities: Warm, no edema  CNS: Alert and oriented Data Reviewed: Basic Metabolic Panel:  Recent Labs Lab 10/14/14 2337 10/16/14 0700  NA 138 134*  K 3.4* 3.2*  CL 97 103  CO2 27 24  GLUCOSE 110* 120*  BUN 20 9  CREATININE 0.64 0.51  CALCIUM 9.4 7.3*   Liver Function Tests:  Recent Labs Lab 10/14/14 2337 10/16/14 0700  AST 35 33  ALT 18 16  ALKPHOS 56 53  BILITOT 0.7 0.3  PROT 5.8* 4.4*  ALBUMIN 2.9* 2.0*   No results for input(s): LIPASE, AMYLASE in the last 168 hours. No results for input(s): AMMONIA in the last 168 hours. CBC:  Recent Labs Lab 10/14/14 2337 10/15/14 0830 10/16/14 0700  WBC 26.8* 26.3* 22.3*  NEUTROABS 21.9* 22.3*  --   HGB 11.5* 12.0 10.6*  HCT 34.1* 35.2* 32.3*  MCV 85.5 85.4 87.1  PLT 335 337 329   Cardiac Enzymes:  Recent Labs Lab 10/14/14 2337  TROPONINI 0.03   BNP (last 3 results) No results for input(s): PROBNP in the last 8760 hours. CBG: No results for input(s): GLUCAP in the last  168 hours.  Recent Results (from the past 240 hour(s))  Urine culture     Status: None   Collection Time: 10/15/14  1:25 AM  Result Value Ref Range Status   Specimen Description URINE, CLEAN CATCH  Final   Special Requests NONE  Final   Colony Count NO GROWTH Performed at [facility]   Final   Culture NO GROWTH Performed at [facility]   Final   Report Status 10/16/2014 FINAL  Final  Clostridium Difficile by PCR     Status: None   Collection Time: 10/15/14  6:42 AM  Result Value Ref Range Status   C difficile by pcr NEGATIVE NEGATIVE Final     Studies: Dg Chest 2 View  10/15/2014   CLINICAL DATA:  Status post fall. Concern for chest injury. Initial encounter.  EXAM: CHEST  2 VIEW  COMPARISON:  None.  FINDINGS: The lungs are hypoexpanded. There is elevation of the right hemidiaphragm. Vascular crowding and vascular congestion are seen. Minimal right mid lung opacity and left basilar opacity may reflect atelectasis or possibly pneumonia. There is no evidence of pleural effusion or pneumothorax.  The heart is borderline enlarged. There appears to be an acute compression fracture involving L1; cortical irregularity at the inferior endplate of L2 appears to reflect remote injury, on correlation with subsequent CT. Mild anterolisthesis at the mid cervical spine is thought to be chronic in nature. Clips are noted at the right axilla.  IMPRESSION: 1. Acute compression fracture involving the superior endplate of L1, noted on subsequent CT. 2. Lungs hypoexpanded, with elevation of the right hemidiaphragm. Minimal right mid lung and left basilar opacity may reflect atelectasis or possibly pneumonia. 3. No displaced rib fracture seen. 4. Mild vascular congestion and borderline cardiomegaly.   Electronically Signed   By: Garald Balding M.D.   On: 10/15/2014 03:14   Ct Abdomen Pelvis W Contrast  10/15/2014   CLINICAL DATA:  Acute onset of constipation for 3 days. Generalized weakness.  Status post fall. Initial encounter.  EXAM: CT ABDOMEN AND PELVIS WITH CONTRAST  TECHNIQUE: Multidetector CT imaging of the abdomen and pelvis was performed using the standard protocol following bolus administration of intravenous contrast.  CONTRAST:  117mL OMNIPAQUE IOHEXOL 300 MG/ML  SOLN  COMPARISON:  None.  FINDINGS: Mild bibasilar atelectasis or scarring is noted. Scattered calcified mediastinal nodes likely reflect remote granulomatous disease. A tiny hiatal hernia is noted. Diffuse coronary artery calcifications are seen. There is dense calcification at the aortic and mitral valves.  There is no evidence of solid or hollow organ injury.  Decreased attenuation at medial hepatic dome is thought to be artifactual in nature. No definite hepatic laceration is seen. The spleen is diminutive, with a single calcified granuloma noted. There is suggestion of a small stone at the base of the gallbladder. The gallbladder is otherwise unremarkable. The pancreas and adrenal glands are unremarkable.  Mild nonspecific perinephric stranding is noted bilaterally. The kidneys are otherwise unremarkable. There is no evidence of hydronephrosis. No renal or ureteral stones are seen.  Mucosal edema and wall  thickening is noted along the transverse, descending and proximal sigmoid colon, compatible with colitis. Associated soft tissue inflammation and trace free fluid is seen. There is no evidence of perforation or abscess formation at this time.  Contrast progresses to the level of the rectum. The sigmoid colon is somewhat redundant. Scattered diverticulosis is noted along the distal descending and proximal sigmoid colon.  The small bowel is unremarkable in appearance. The stomach is within normal limits. No acute vascular abnormalities are seen.  There is borderline aneurysmal dilatation of the infrarenal abdominal aorta, measuring 2.6 cm in AP dimension and 3.1 cm in transverse dimension. Diffuse calcification is seen along the  abdominal aorta and its branches, including at the origins of the renal arteries bilaterally. Aneurysmal dilatation resolves proximal to the level of the aortic bifurcation.  The appendix is not definitely seen. There is no evidence of appendicitis.  The bladder is moderately distended and grossly unremarkable. The uterus is within normal limits. The ovaries are grossly symmetric. No suspicious adnexal masses are seen. A vaginal pessary is noted. A few calcified epiploic appendages are seen at the upper pelvis. No inguinal lymphadenopathy is seen.  There is an acute compression fracture involving the superior endplate of L1, with approximately 30% loss of height. There is approximately 5 mm of retropulsion. There is no evidence of extension to the posterior elements. There is mild chronic compression deformity involving the inferior endplate of L2. Endplate sclerotic change is noted at the lower lumbar spine, with associated disc space narrowing. Vacuum phenomenon is noted at L2-L3 and L3-L4.  IMPRESSION: 1. Acute colitis involving the transverse, descending and proximal sigmoid colon, with mucosal edema and wall thickening, and surrounding soft tissue inflammation and trace fluid. No evidence of perforation or abscess formation at this time. This may be infectious or inflammatory in nature; there is no definite evidence of ischemia. 2. Acute compression fracture involving the superior endplate of L1, with approximately 30% loss of height. There is approximately 5 mm of retropulsion. No evidence of extension to the posterior elements. 3. Borderline aneurysmal dilatation of the infrarenal abdominal aorta, measuring 2.6 cm in AP dimension and 3.1 cm in transverse dimension. Diffuse calcification along the abdominal aorta and its branches, including at the origins of the renal arteries. Recommend followup by ultrasound in 3 years. This recommendation follows ACR consensus guidelines: White Paper of the ACR Incidental  Findings Committee II on Vascular Findings. J Am Coll Radiol 2013; 20:254-270 4. Diffuse coronary artery calcifications noted. Dense calcification at the aortic and mitral valves. 5. Cholelithiasis; gallbladder otherwise unremarkable. 6. Scattered diverticulosis along the distal descending and proximal sigmoid colon, without evidence of diverticulitis. 7. Mild bibasilar atelectasis or scarring noted. Scattered calcified mediastinal nodes likely reflect remote granulomatous disease. 8. Mild degenerative change noted along the lower lumbar spine.   Electronically Signed   By: Garald Balding M.D.   On: 10/15/2014 03:32   Dg Knee Complete 4 Views Right  10/15/2014   CLINICAL DATA:  Status post fall, with anterior right knee pain. Initial encounter.  EXAM: RIGHT KNEE - COMPLETE 4+ VIEW  COMPARISON:  None.  FINDINGS: There is no evidence of fracture or dislocation. The joint spaces are preserved. Small marginal osteophytes are seen at the lateral compartment. There is minimal cortical irregularity at the upper pole of the patella. A fabella is noted.  No significant joint effusion is seen. Scattered vascular calcifications are seen.  IMPRESSION: 1. No evidence of fracture or dislocation. 2. Minimal degenerative change  at the lateral compartment. 3. Scattered vascular calcifications seen.   Electronically Signed   By: Garald Balding M.D.   On: 10/15/2014 03:04   Dg Hip Unilat With Pelvis 2-3 Views Left  10/15/2014   CLINICAL DATA:  Status post fall, with acute onset of left posterior hip pain. Initial encounter.  EXAM: DG HIP W/ PELVIS 2-3V*L*  COMPARISON:  None.  FINDINGS: There is no evidence of fracture or dislocation. Both femoral heads are seated normally within their respective acetabula. The proximal left femur appears intact. Disc space narrowing is noted at L5-S1, with associated endplate sclerotic change. The sacroiliac joints are unremarkable in appearance.  Contrast is noted within the distal small bowel  and cecum.  IMPRESSION: No evidence of fracture or dislocation.   Electronically Signed   By: Garald Balding M.D.   On: 10/15/2014 03:08    Scheduled Meds: . amLODipine  5 mg Oral Daily  . antiseptic oral rinse  7 mL Mouth Rinse BID  . ciprofloxacin  400 mg Intravenous Q12H  . fenofibrate  54 mg Oral Daily  . metronidazole  500 mg Intravenous Q8H  . saccharomyces boulardii  250 mg Oral BID   Continuous Infusions: . sodium chloride 0.9 % 1,000 mL with potassium chloride 40 mEq infusion 75 mL/hr at 10/16/14 1138       Time spent: 25 minutes    Alleta Avery, [facility] Pager [phone] If 7PM-7AM, please contact night-coverage at [email or web address], password [facility] 10/16/2014, 2:54 PM  LOS: 2 days

## 2014-10-16 NOTE — Progress Notes (Signed)
GASTROENTEROLOGY PROGRESS NOTE  Problem:   Distal colitis, status post previous right hemicolectomy for colon cancer. Also, acute lumbar spine fracture.  Subjective: Definitely feels better today than yesterday. Still having some loose stools, but they are small in volume.  Objective: Vital signs satisfactory although blood pressure is a bit lower. The patient is lying in bed in no distress, and seems somewhat more alert and oriented than yesterday. Her son is at the bedside. The abdomen remains mild to moderately distended and moderately tympanitic, but soft and nontender, with bowel sounds present. Overall, it is a benign abdomen.  White blood count remains significantly elevated at 22,000, but this is still somewhat improved from yesterday, when it was 26,000. Hemoglobin, as expected, has dropped slightly with hydration to a current level of 10.6.  C. difficile PCR is negative. Stool pathogen panel pending.  Assessment: Left-sided colitis, which I suspect is ischemic in character, mildly improved.  Plan: 1. Continue observation on current management. The patient is on antibiotics, although I would have a low threshold for stopping those soon if the patient continues to improve. She is on probiotic therapy as well. 2. I will recheck labs tomorrow, and will obtain an abdominal flat plate tomorrow to assess degree of colonic distention and distribution of gas within the GI tract. 3. At this time, I do not think that sigmoidoscopic evaluation is necessary, but I would certainly consider it if the patient takes a turn for the worse, in which case we might question whether gangrene had developed in the involved segment of colon.  Cleotis Nipper, M.D. 10/16/2014 11:38 AM

## 2014-10-17 ENCOUNTER — Inpatient Hospital Stay (HOSPITAL_COMMUNITY): Payer: Medicare Other

## 2014-10-17 DIAGNOSIS — K550 Acute vascular disorders of intestine: Secondary | ICD-10-CM

## 2014-10-17 DIAGNOSIS — S32010D Wedge compression fracture of first lumbar vertebra, subsequent encounter for fracture with routine healing: Secondary | ICD-10-CM

## 2014-10-17 LAB — CBC WITH DIFFERENTIAL/PLATELET
BASOS ABS: 0 10*3/uL (ref 0.0–0.1)
Basophils Relative: 0 % (ref 0–1)
Eosinophils Absolute: 0.2 10*3/uL (ref 0.0–0.7)
Eosinophils Relative: 1 % (ref 0–5)
HCT: 32.9 % — ABNORMAL LOW (ref 36.0–46.0)
HEMOGLOBIN: 10.8 g/dL — AB (ref 12.0–15.0)
Lymphocytes Relative: 9 % — ABNORMAL LOW (ref 12–46)
Lymphs Abs: 1.6 10*3/uL (ref 0.7–4.0)
MCH: 28.3 pg (ref 26.0–34.0)
MCHC: 32.8 g/dL (ref 30.0–36.0)
MCV: 86.4 fL (ref 78.0–100.0)
Monocytes Absolute: 1.8 10*3/uL — ABNORMAL HIGH (ref 0.1–1.0)
Monocytes Relative: 10 % (ref 3–12)
NEUTROS ABS: 13.7 10*3/uL — AB (ref 1.7–7.7)
NEUTROS PCT: 80 % — AB (ref 43–77)
Platelets: 379 10*3/uL (ref 150–400)
RBC: 3.81 MIL/uL — AB (ref 3.87–5.11)
RDW: 13.8 % (ref 11.5–15.5)
WBC: 17.3 10*3/uL — ABNORMAL HIGH (ref 4.0–10.5)

## 2014-10-17 LAB — COMPREHENSIVE METABOLIC PANEL
ALT: 18 U/L (ref 0–35)
AST: 27 U/L (ref 0–37)
Albumin: 2.1 g/dL — ABNORMAL LOW (ref 3.5–5.2)
Alkaline Phosphatase: 62 U/L (ref 39–117)
Anion gap: 5 (ref 5–15)
BUN: <5 mg/dL — ABNORMAL LOW (ref 6–23)
CO2: 24 mmol/L (ref 19–32)
Calcium: 7.5 mg/dL — ABNORMAL LOW (ref 8.4–10.5)
Chloride: 108 mEq/L (ref 96–112)
Creatinine, Ser: 0.45 mg/dL — ABNORMAL LOW (ref 0.50–1.10)
GFR calc Af Amer: >90 mL/min (ref >90)
GFR, EST NON AFRICAN AMERICAN: 86 mL/min — AB (ref >90)
Glucose, Bld: 77 mg/dL (ref 70–99)
Potassium: 3.8 mmol/L (ref 3.5–5.1)
Sodium: 137 mmol/L (ref 135–145)
Total Bilirubin: 0.6 mg/dL (ref 0.3–1.2)
Total Protein: 4.8 g/dL — ABNORMAL LOW (ref 6.0–8.3)

## 2014-10-17 LAB — GLUCOSE, CAPILLARY: Glucose-Capillary: 114 mg/dL — ABNORMAL HIGH (ref 70–99)

## 2014-10-17 NOTE — Progress Notes (Signed)
Patient ID: Whitney Marquez, female   DOB: [birth year], 81 y.o.   MRN: [id number] [facility] Gastroenterology Progress Note  Ashla Murph 81 y.o. 08-17-1923   Subjective: Feels a lot better today stating the abdominal pain is better. She thinks she had a small stool overnight but no record of one in chart. Tolerating clears. Son at bedside.  Objective: Vital signs in last 24 hours: Filed Vitals:   10/17/14 0510  BP: 118/78  Pulse: 95  Temp: 98.2 F (36.8 C)  Resp: 15    Physical Exam: Gen: awake, alert, no acute distress, elderly, frail CV: RRR Chest: Clear to auscultation anteriorly Abd: distended, diffusely tender with minimal guarding, +BS  Lab Results:  Recent Labs  10/16/14 0700 10/17/14 0555  NA 134* 137  K 3.2* 3.8  CL 103 108  CO2 24 24  GLUCOSE 120* 77  BUN 9 <5*  CREATININE 0.51 0.45*  CALCIUM 7.3* 7.5*    Recent Labs  10/16/14 0700 10/17/14 0555  AST 33 27  ALT 16 18  ALKPHOS 53 62  BILITOT 0.3 0.6  PROT 4.4* 4.8*  ALBUMIN 2.0* 2.1*    Recent Labs  10/15/14 0830 10/16/14 0700 10/17/14 0555  WBC 26.3* 22.3* 17.3*  NEUTROABS 22.3*  --  13.7*  HGB 12.0 10.6* 10.8*  HCT 35.2* 32.3* 32.9*  MCV 85.4 87.1 86.4  PLT 337 329 379   No results for input(s): LABPROT, INR in the last 72 hours.    Assessment/Plan: Likely ischemic colitis that is improving with fluids, IV Abx, and supportive care. WBC down to 17.3. KUB this morning ordered and if ok then ok to advance to full liquids. Continue Abx. No role for sigmoidoscopy at this time. Will follow.   Mas[place]ope C. 10/17/2014, 8:56 AM

## 2014-10-17 NOTE — Evaluation (Signed)
Physical Therapy Evaluation Patient Details Name: Whitney Marquez MRN: [id number] DOB: 06/07/1923 Today's Date: 10/17/2014   History of Present Illness    81 year old female with history of hypertension, hyperlipidemia, history of colon cancer status post right hemicolectomy about 16-17 years back, breast cancer in remission was brought to the ED after she had a fall and landed on her back. She had gone to see her orthopedic surgeon on the day of admission for right knee pain. She was taking some laxative with complains of constipation. Following this she had several episodes of diarrhea and was brought to the ED. In the ED a CT scan of the abdomen and pelvis showed acute colitis involving the transverse, descending in the proximal sigmoid colon. Also showed L1 compression fracture. Patient admitted for further evaluation and management.   Clinical Impression  Pt admitted with above diagnosis. Pt currently with functional limitations due to the deficits listed below (see PT Problem List).  Pt able to ambulate with RW with fairly good safety.  Needed close min assist for safety and this PT agrees with plan that son has set up to go to [place] living with transition to I living when pt ready.  Pt will need HHPT.  Hopeful that pt will be able to use RW in facility but if she does not progress to that level should be able to use wheelchair in facility.  Son to bring their wheelchair in (thinks they have one from his dad) and PT will see if a new one needs to be ordered prior to her d/c.  Anticipate pt will do well.  Pt will benefit from skilled PT to increase their independence and safety with mobility to allow discharge to the venue listed below.      Follow Up Recommendations Home health PT;Supervision/Assistance - 24 hour (Plan per son is A living; may need to be w/c level initially)    Equipment Recommendations  Wheelchair (measurements PT);Wheelchair cushion (measurements PT) (possibly needs  this if ambulation does not progress )    Recommendations for Other Services       Precautions / Restrictions Precautions Precautions: Fall Restrictions Weight Bearing Restrictions: No      Mobility  Bed Mobility Overal bed mobility: Needs Assistance;+2 for physical assistance Bed Mobility: Supine to Sit     Supine to sit: Mod assist;+2 for physical assistance     General bed mobility comments: Needed assist for LEs and elevation of trunk.   Transfers Overall transfer level: Needs assistance Equipment used: 2 person hand held assist Transfers: Sit to/from [facility] Sit to Stand: Mod assist;+2 physical assistance Stand pivot transfers: Mod assist;+2 physical assistance       General transfer comment: Pt needed mod assist to perform sit to stand.  Pt hurrying as she needed to have BM.  Mod assit to pivot to 3N1.    Ambulation/Gait Ambulation/Gait assistance: Min assist;+2 safety/equipment Ambulation Distance (Feet): 25 Feet Assistive device: Rolling walker (2 wheeled) Gait Pattern/deviations: Step-to pattern;Decreased stride length;Decreased step length - right;Decreased step length - left;Wide base of support;Trunk flexed   Gait velocity interpretation: Below normal speed for age/gender General Gait Details: Pt was able to use RW well to walk around bed.  Pt requires some cues for sequencing steps and RW as well as to steer RW however pt was steady for the most part.  Needed +2 to set up chair and have it as stand by.    Stairs  Wheelchair Mobility    Modified Rankin (Stroke Patients Only)       Balance Overall balance assessment: Needs assistance;History of Falls Sitting-balance support: No upper extremity supported;Feet supported Sitting balance-Leahy Scale: Fair   Postural control: Posterior lean Standing balance support: Bilateral upper extremity supported;During functional activity Standing balance-Leahy Scale:  Poor Standing balance comment: Needs RW for support in static stance.  Cannot take hands off RW.                              Pertinent Vitals/Pain Pain Assessment: 0-10 Pain Score: 8  Pain Location: back Pain Descriptors / Indicators: Aching Pain Intervention(s): Limited activity within patient's tolerance;Monitored during session;Patient requesting pain meds-RN notified;Repositioned;Relaxation  VSS    Home Living Family/patient expects to be discharged to:: Assisted living Living Arrangements: Alone;Children Available Help at Discharge: Family;Available PRN/intermittently Type of Home: House Home Access: Level entry     Home Layout: One level Home Equipment: Walker - 4 wheels;Cane - quad;Shower seat;Grab bars - toilet;Grab bars - tub/shower (son thinks they have a wheelchair but unsure if pt can use) Additional Comments: Son states they have pt approved to go to A living at [place] and then to I living when pt is ready.  Pt was at home PTA.    Prior Function Level of Independence: Independent with assistive device(s)         Comments: Recently pt struggling and needing cane and had a fall.      Hand Dominance        Extremity/Trunk Assessment   Upper Extremity Assessment: Defer to OT evaluation           Lower Extremity Assessment: Generalized weakness      Cervical / Trunk Assessment: Kyphotic  Communication   Communication: No difficulties  Cognition Arousal/Alertness: Awake/alert Behavior During Therapy: WFL for tasks assessed/performed Overall Cognitive Status: Within Functional Limits for tasks assessed                      General Comments      Exercises General Exercises - Lower Extremity Long Arc Quad: AROM;Both;5 reps;Seated      Assessment/Plan    PT Assessment Patient needs continued PT services  PT Diagnosis Generalized weakness;Acute pain   PT Problem List Decreased activity tolerance;Decreased  strength;Decreased balance;Decreased mobility;Decreased knowledge of use of DME;Decreased safety awareness;Decreased knowledge of precautions;Pain  PT Treatment Interventions DME instruction;Gait training;Functional mobility training;Therapeutic exercise;Therapeutic activities;Balance training;Patient/family education   PT Goals (Current goals can be found in the Care Plan section) Acute Rehab PT Goals Patient Stated Goal: to go to A living PT Goal Formulation: With patient/family Time For Goal Achievement: 10/24/14 Potential to Achieve Goals: Good    Frequency Min 3X/week   Barriers to discharge        Co-evaluation               End of Session Equipment Utilized During Treatment: Gait belt;Oxygen Activity Tolerance: Patient limited by fatigue;Patient limited by pain Patient left: in chair;with call bell/phone within reach;with family/visitor present Nurse Communication: Mobility status;Patient requests pain meds         Time: 1124-1209 PT Time Calculation (min) (ACUTE ONLY): 45 min   Charges:   PT Evaluation $Initial PT Evaluation Tier I: 1 Procedure PT Treatments $Gait Training: 8-22 mins $Therapeutic Activity: 8-22 mins $Self Care/Home Management: 8-22   PT G Codes:        Whitney Marquez  F 10/17/2014, 2:06 PM Whitney Marquez [facility] Acute Rehabilitation [phone] (pager)

## 2014-10-17 NOTE — Progress Notes (Signed)
[facility] PROGRESS NOTE  Whitney Marquez TKP:[id number] DOB: 03-07-1923 DOA: 10/14/2014 PCP: No primary care provider on file.   Brief narrative 81 year old female with history of hypertension, hyperlipidemia, history of colon cancer status post right hemicolectomy about 16-17 years back, breast cancer in remission was brought to the ED after she had a fall and landed on her back. She had gone to see her orthopedic surgeon on the day of admission for right knee pain. She was taking some laxative with complains of constipation. Following this she had several episodes of diarrhea and was brought to the ED. In the ED a CT scan of the abdomen and pelvis showed acute colitis involving the transverse, descending in the proximal sigmoid colon. Also showed L1 compression fracture. Patient admitted for further evaluation and management.  Assessment/Plan: SIRS with Acute diffuse colitis -Likely to be ischemic. Stool for C. difficile negative.  Continue empiric ciprofloxacin and Flagyl. Pain control with when necessary IV morphine. Continue IV hydration with normal saline. -Serial abdominal exam close observation. Diarrhea and abdominal pain improving. -Leukocytosis improving.. Lactic acid negative. -Follow GI pathogen panel -Abdominal x-ray showing normal bowel gas pattern. Advance diet to full liquid.   L1 acute compression fracture Patient reports having a fall after returning from orthopedics clinic yesterday. Reviewed Dr. Aurea Graff office note. Patient has history of compressive lumbar fracture with degenerative disc disease. PT eval and pain control for now.  Anemia Mild with pos occult blood. H&H stable  History of colon cancer and breast cancer in remission Status post right hemicolectomy in late 1990s.  Hypertension Continue amlodipine  Right knee osteoarthritis Following with Dr. Alvan Dame  DVT prophylaxis: Subcutaneous Lovenox  Diet: Full Liquid    Code Status: full code Family  Communication: son at bedside Disposition Plan: Currently inpatient   Consultants:  [facility]  Procedures:  CT abdomen and pelvis  Antibiotics:  Cipro and Flagyl  HPI/Subjective: Diarrhea and abdominal pain improved. Still has abdominal distention   Objective: Filed Vitals:   10/17/14 0510  BP: 118/78  Pulse: 95  Temp: 98.2 F (36.8 C)  Resp: 15    Intake/Output Summary (Last 24 hours) at 10/17/14 1200 Last data filed at 10/17/14 1010  Gross per 24 hour  Intake 4058.75 ml  Output   2040 ml  Net 2018.75 ml   Filed Weights   10/15/14 1300  Weight: 64.864 kg (143 lb)    Exam:   General:  Elderly female lying in bed in no acute distress  HEENT: No pallor, moist oral mucosa  Chest: Clear bilaterally  CVS: Normal S1 and S2, no murmurs  Abdomen: Distended (unchanged), bowel sounds present, nontender, no guarding or rigidity Extremities: Warm, no edema  CNS: Alert and oriented   Data Reviewed: Basic Metabolic Panel:  Recent Labs Lab 10/14/14 2337 10/16/14 0700 10/17/14 0555  NA 138 134* 137  K 3.4* 3.2* 3.8  CL 97 103 108  CO2 27 24 24   GLUCOSE 110* 120* 77  BUN 20 9 <5*  CREATININE 0.64 0.51 0.45*  CALCIUM 9.4 7.3* 7.5*   Liver Function Tests:  Recent Labs Lab 10/14/14 2337 10/16/14 0700 10/17/14 0555  AST 35 33 27  ALT 18 16 18   ALKPHOS 56 53 62  BILITOT 0.7 0.3 0.6  PROT 5.8* 4.4* 4.8*  ALBUMIN 2.9* 2.0* 2.1*   No results for input(s): LIPASE, AMYLASE in the last 168 hours. No results for input(s): AMMONIA in the last 168 hours. CBC:  Recent Labs Lab 10/14/14 2337 10/15/14  0830 10/16/14 0700 10/17/14 0555  WBC 26.8* 26.3* 22.3* 17.3*  NEUTROABS 21.9* 22.3*  --  13.7*  HGB 11.5* 12.0 10.6* 10.8*  HCT 34.1* 35.2* 32.3* 32.9*  MCV 85.5 85.4 87.1 86.4  PLT 335 337 329 379   Cardiac Enzymes:  Recent Labs Lab 10/14/14 2337  TROPONINI 0.03   BNP (last 3 results) No results for input(s): PROBNP in the last 8760  hours. CBG: No results for input(s): GLUCAP in the last 168 hours.  Recent Results (from the past 240 hour(s))  Urine culture     Status: None   Collection Time: 10/15/14  1:25 AM  Result Value Ref Range Status   Specimen Description URINE, CLEAN CATCH  Final   Special Requests NONE  Final   Colony Count NO GROWTH Performed at [facility]   Final   Culture NO GROWTH Performed at [facility]   Final   Report Status 10/16/2014 FINAL  Final  Clostridium Difficile by PCR     Status: None   Collection Time: 10/15/14  6:42 AM  Result Value Ref Range Status   C difficile by pcr NEGATIVE NEGATIVE Final     Studies: Dg Abd 1 View  10/17/2014   CLINICAL DATA:  Colitis.  EXAM: ABDOMEN - 1 VIEW  COMPARISON:  None.  FINDINGS: The bowel gas pattern is normal. No radio-opaque calculi or other significant radiographic abnormality are seen. Pessary ring noted in the vagina. Spine degenerative changes and scoliosis noted.  IMPRESSION: Normal bowel gas pattern.  No acute findings.   Electronically Signed   By: Earle Gell M.D.   On: 10/17/2014 10:17    Scheduled Meds: . amLODipine  5 mg Oral Daily  . antiseptic oral rinse  7 mL Mouth Rinse BID  . ciprofloxacin  400 mg Intravenous Q12H  . fenofibrate  54 mg Oral Daily  . metronidazole  500 mg Intravenous Q8H  . saccharomyces boulardii  250 mg Oral BID   Continuous Infusions: . sodium chloride 0.9 % 1,000 mL with potassium chloride 40 mEq infusion 75 mL/hr at 10/17/14 0300       Time spent: 25 minutes    Yuliet Needs, [facility] Pager [phone] If 7PM-7AM, please contact night-coverage at [email or web address], password [facility] 10/17/2014, 12:00 PM  LOS: 3 days

## 2014-10-18 LAB — CBC
HCT: 33.1 % — ABNORMAL LOW (ref 36.0–46.0)
Hemoglobin: 11 g/dL — ABNORMAL LOW (ref 12.0–15.0)
MCH: 28.2 pg (ref 26.0–34.0)
MCHC: 33.2 g/dL (ref 30.0–36.0)
MCV: 84.9 fL (ref 78.0–100.0)
Platelets: 396 10*3/uL (ref 150–400)
RBC: 3.9 MIL/uL (ref 3.87–5.11)
RDW: 13.9 % (ref 11.5–15.5)
WBC: 15.2 10*3/uL — AB (ref 4.0–10.5)

## 2014-10-18 NOTE — Progress Notes (Signed)
Patient ID: Whitney Marquez, female   DOB: 1923/05/07, 81 y.o.   MRN: [id number] [facility]enterology Progress Note  Mertice Uffelman 81 y.o. [birth year]   Subjective: Still having diarrhea. Feels ok. Tolerating full liquids. Son at bedside.  Objective: Vital signs in last 24 hours: Filed Vitals:   10/18/14 0655  BP: 142/79  Pulse: 96  Temp: 98.7 F (37.1 C)  Resp: 18    Physical Exam: Gen: elderly, frail, alert, no acute distress Abd: distended, less tender with guarding  Lab Results:  Recent Labs  10/16/14 0700 10/17/14 0555  NA 134* 137  K 3.2* 3.8  CL 103 108  CO2 24 24  GLUCOSE 120* 77  BUN 9 <5*  CREATININE 0.51 0.45*  CALCIUM 7.3* 7.5*    Recent Labs  10/16/14 0700 10/17/14 0555  AST 33 27  ALT 16 18  ALKPHOS 53 62  BILITOT 0.3 0.6  PROT 4.4* 4.8*  ALBUMIN 2.0* 2.1*    Recent Labs  10/17/14 0555 10/18/14 0636  WBC 17.3* 15.2*  NEUTROABS 13.7*  --   HGB 10.8* 11.0*  HCT 32.9* 33.1*  MCV 86.4 84.9  PLT 379 396   No results for input(s): LABPROT, INR in the last 72 hours.  KUB ok yesterday.  Assessment/Plan: Ischemic colitis - slowly improving. WBC trending down. Advance diet to soft food today. Continue IV Abx. Supportive care. Will sign off. Dr. Penelope Coop available to see this week if needed. Call with questions.   Secor C. 10/18/2014, 11:19 AM

## 2014-10-18 NOTE — Progress Notes (Signed)
ANTIBIOTIC CONSULT NOTE - FOLLOW UP  Pharmacy Consult for ciprofloxacin Indication: Intra-abdominal Infection  Allergies  Allergen Reactions  . Cephalosporins Anaphylaxis  . Other Swelling    Walnuts causes lips swelling  . Macrobid [Nitrofurantoin Monohyd Macro] Swelling and Rash  . Penicillins Hives and Rash  . Shellfish Allergy Rash    Patient Measurements: Height: 5' 4.5" (163.8 cm) (estimated by patient) Weight: 143 lb (64.864 kg) (estimated by patient) IBW/kg (Calculated) : 55.85   Vital Signs: Temp: 98.7 F (37.1 C) (01/17 0655) Temp Source: Oral (01/17 0655) BP: 142/79 mmHg (01/17 0611) Pulse Rate: 96 (01/17 0611) Intake/Output from previous day: 01/16 0701 - 01/17 0700 In: 1172.5 [P.O.:240; I.V.:632.5; IV Piggyback:300] Out: 1315 [Urine:1315] Intake/Output from this shift:    Labs:  Recent Labs  10/16/14 0700 10/17/14 0555 10/18/14 0636  WBC 22.3* 17.3* 15.2*  HGB 10.6* 10.8* 11.0*  PLT 329 379 396  CREATININE 0.51 0.45*  --    Estimated Creatinine Clearance: 42.1 mL/min (by C-G formula based on Cr of 0.45). No results for input(s): VANCOTROUGH, VANCOPEAK, VANCORANDOM, GENTTROUGH, GENTPEAK, GENTRANDOM, TOBRATROUGH, TOBRAPEAK, TOBRARND, AMIKACINPEAK, AMIKACINTROU, AMIKACIN in the last 72 hours.   Microbiology: Recent Results (from the past 720 hour(s))  Urine culture     Status: None   Collection Time: 10/15/14  1:25 AM  Result Value Ref Range Status   Specimen Description URINE, CLEAN CATCH  Final   Special Requests NONE  Final   Colony Count NO GROWTH Performed at [facility]   Final   Culture NO GROWTH Performed at [facility]   Final   Report Status 10/16/2014 FINAL  Final  Clostridium Difficile by PCR     Status: None   Collection Time: 10/15/14  6:42 AM  Result Value Ref Range Status   C difficile by pcr NEGATIVE NEGATIVE Final  Stool culture     Status: None (Preliminary result)   Collection Time: 10/15/14  8:05 AM   Result Value Ref Range Status   Specimen Description STOOL  Final   Special Requests NONE  Final   Culture   Final    NO SUSPICIOUS COLONIES, CONTINUING TO HOLD Performed at [facility]    Report Status PENDING  Incomplete    Anti-infectives    Start     Dose/Rate Route Frequency Ordered Stop   10/15/14 1400  metroNIDAZOLE (FLAGYL) IVPB 500 mg     500 mg100 mL/hr over 60 Minutes Intravenous Every 8 hours 10/15/14 1227     10/15/14 1200  vancomycin (VANCOCIN) 50 mg/mL oral solution 125 mg  Status:  Discontinued     125 mg Oral 4 times per day 10/15/14 1105 10/15/14 1227   10/15/14 0800  metroNIDAZOLE (FLAGYL) IVPB 500 mg  Status:  Discontinued     500 mg100 mL/hr over 60 Minutes Intravenous 3 times per day 10/15/14 0717 10/15/14 1105   10/15/14 0800  ciprofloxacin (CIPRO) IVPB 400 mg     400 mg200 mL/hr over 60 Minutes Intravenous Every 12 hours 10/15/14 0723     10/15/14 0430  ciprofloxacin (CIPRO) IVPB 400 mg     400 mg200 mL/hr over 60 Minutes Intravenous  Once 10/15/14 0423 10/15/14 0624   10/15/14 0430  metroNIDAZOLE (FLAGYL) IVPB 500 mg     500 mg100 mL/hr over 60 Minutes Intravenous  Once 10/15/14 0423 10/15/14 0554      Assessment: Patient is an 81 y.o F on cipro and flagyl empirically for ischemic colitis with plan to  advance diet today.  WBC trending down to 15.2 today, Afeb, all cultures have been negative thus far, scr stable at 0.45    Cipro 1/14>> Flagyl 1/14>>  1/14 C diff PCR neg 1/14 urine - NG FINAL 1/14 stool - pending   Plan:  - Continue Cipro 400 mg IV q12hrs. - Continue Flagyl 500 mg IV q8hrs    Yesica Kemler P 10/18/2014,11:59 AM

## 2014-10-18 NOTE — Progress Notes (Signed)
[facility] PROGRESS NOTE  Whitney Marquez HTM:[id number] DOB: 13-Jul-1923 DOA: 10/14/2014 PCP: No primary care provider on file.   Brief narrative 81 year old female with history of hypertension, hyperlipidemia, history of colon cancer status post right hemicolectomy about 16-17 years back, breast cancer in remission was brought to the ED after she had a fall and landed on her back. She had gone to see her orthopedic surgeon on the day of admission for right knee pain. She was taking some laxative with complains of constipation. Following this she had several episodes of diarrhea and was brought to the ED. In the ED a CT scan of the abdomen and pelvis showed acute colitis involving the transverse, descending in the proximal sigmoid colon. Also showed L1 compression fracture. Patient admitted for further evaluation and management.  Assessment/Plan: SIRS with Acute diffuse colitis -Likely to be ischemic. Stool for C. difficile negative.  Continue empiric ciprofloxacin and Flagyl. Pain control with when necessary IV morphine. - Diarrhea and abdominal pain improving. -Leukocytosis improving.. Lactic acid negative. -Follow GI pathogen panel. -No obstruction on abdominal x-ray. Diet advance to soft.   L1 acute compression fracture Patient reports having a fall after returning from orthopedics clinic yesterday. Reviewed Dr. Aurea Graff office note. Patient has history of compressive lumbar fracture with degenerative disc disease. PT eval and pain control for now.  Anemia Mild with pos occult blood. H&H stable  History of colon cancer and breast cancer in remission Status post right hemicolectomy in late 1990s.  Hypertension Continue amlodipine  Right knee osteoarthritis Following with Dr. Alvan Dame  DVT prophylaxis: Subcutaneous Lovenox  Diet: soft    Code Status: full code Family Communication: son at bedside Disposition Plan: Home with home health as per PT. Likely in the next 48 hours  if continues to improve.   Consultants:  [facility]  Procedures:  CT abdomen and pelvis  Antibiotics:  Cipro and Flagyl  HPI/Subjective: No further abdominal pain. Still has some distention . Last episode of diarrhea was yday evening   Objective: Filed Vitals:   10/18/14 0655  BP:   Pulse:   Temp: 98.7 F (37.1 C)  Resp:     Intake/Output Summary (Last 24 hours) at 10/18/14 1319 Last data filed at 10/18/14 4695  Gross per 24 hour  Intake   1045 ml  Output    525 ml  Net    520 ml   Filed Weights   10/15/14 1300  Weight: 64.864 kg (143 lb)    Exam:   General:   in no acute distress  HEENT:  moist oral mucosa  Chest: Clear bilaterally  CVS: Normal S1 and S2, no murmurs  Abdomen: Distended (unchanged), bowel sounds present, nontender, no guarding or rigidity   Extremities: Warm, no edema     Data Reviewed: Basic Metabolic Panel:  Recent Labs Lab 10/14/14 2337 10/16/14 0700 10/17/14 0555  NA 138 134* 137  K 3.4* 3.2* 3.8  CL 97 103 108  CO2 27 24 24   GLUCOSE 110* 120* 77  BUN 20 9 <5*  CREATININE 0.64 0.51 0.45*  CALCIUM 9.4 7.3* 7.5*   Liver Function Tests:  Recent Labs Lab 10/14/14 2337 10/16/14 0700 10/17/14 0555  AST 35 33 27  ALT 18 16 18   ALKPHOS 56 53 62  BILITOT 0.7 0.3 0.6  PROT 5.8* 4.4* 4.8*  ALBUMIN 2.9* 2.0* 2.1*   No results for input(s): LIPASE, AMYLASE in the last 168 hours. No results for input(s): AMMONIA in the last 168 hours.  CBC:  Recent Labs Lab 10/14/14 2337 10/15/14 0830 10/16/14 0700 10/17/14 0555 10/18/14 0636  WBC 26.8* 26.3* 22.3* 17.3* 15.2*  NEUTROABS 21.9* 22.3*  --  13.7*  --   HGB 11.5* 12.0 10.6* 10.8* 11.0*  HCT 34.1* 35.2* 32.3* 32.9* 33.1*  MCV 85.5 85.4 87.1 86.4 84.9  PLT 335 337 329 379 396   Cardiac Enzymes:  Recent Labs Lab 10/14/14 2337  TROPONINI 0.03   BNP (last 3 results) No results for input(s): PROBNP in the last 8760 hours. CBG:  Recent Labs Lab  10/17/14 1211  GLUCAP 114*    Recent Results (from the past 240 hour(s))  Urine culture     Status: None   Collection Time: 10/15/14  1:25 AM  Result Value Ref Range Status   Specimen Description URINE, CLEAN CATCH  Final   Special Requests NONE  Final   Colony Count NO GROWTH Performed at [facility]   Final   Culture NO GROWTH Performed at [facility]   Final   Report Status 10/16/2014 FINAL  Final  Clostridium Difficile by PCR     Status: None   Collection Time: 10/15/14  6:42 AM  Result Value Ref Range Status   C difficile by pcr NEGATIVE NEGATIVE Final  Stool culture     Status: None (Preliminary result)   Collection Time: 10/15/14  8:05 AM  Result Value Ref Range Status   Specimen Description STOOL  Final   Special Requests NONE  Final   Culture   Final    NO SUSPICIOUS COLONIES, CONTINUING TO HOLD Performed at [facility]    Report Status PENDING  Incomplete     Studies: Dg Abd 1 View  10/17/2014   CLINICAL DATA:  Colitis.  EXAM: ABDOMEN - 1 VIEW  COMPARISON:  None.  FINDINGS: The bowel gas pattern is normal. No radio-opaque calculi or other significant radiographic abnormality are seen. Pessary ring noted in the vagina. Spine degenerative changes and scoliosis noted.  IMPRESSION: Normal bowel gas pattern.  No acute findings.   Electronically Signed   By: Earle Gell M.D.   On: 10/17/2014 10:17    Scheduled Meds: . amLODipine  5 mg Oral Daily  . antiseptic oral rinse  7 mL Mouth Rinse BID  . ciprofloxacin  400 mg Intravenous Q12H  . fenofibrate  54 mg Oral Daily  . metronidazole  500 mg Intravenous Q8H  . saccharomyces boulardii  250 mg Oral BID   Continuous Infusions: . sodium chloride 0.9 % 1,000 mL with potassium chloride 40 mEq infusion 75 mL/hr at 10/18/14 0119       Time spent: 25 minutes    Whitney Marquez  [facility] Pager [phone] If 7PM-7AM, please contact night-coverage at [email or web address], password  [facility] 10/18/2014, 1:19 PM  LOS: 4 days

## 2014-10-19 DIAGNOSIS — K55039 Acute (reversible) ischemia of large intestine, extent unspecified: Secondary | ICD-10-CM | POA: Diagnosis present

## 2014-10-19 DIAGNOSIS — S32010D Wedge compression fracture of first lumbar vertebra, subsequent encounter for fracture with routine healing: Secondary | ICD-10-CM

## 2014-10-19 LAB — STOOL CULTURE

## 2014-10-19 NOTE — Progress Notes (Signed)
Physical Therapy Treatment Patient Details Name: Whitney Marquez MRN: [id number] DOB: Mar 24, 1923 Today's Date: 10/19/2014    History of Present Illness 81 year old female with history of hypertension, hyperlipidemia, history of colon cancer status post right hemicolectomy about 16-17 years back, breast cancer in remission was brought to the ED after she had a fall and landed on her back. She had gone to see her orthopedic surgeon on the day of admission for right knee pain. She was taking some laxative with complains of constipation. Following this she had several episodes of diarrhea and was brought to the ED. In the ED a CT scan of the abdomen and pelvis showed acute colitis involving the transverse, descending in the proximal sigmoid colon. Also showed L1 compression fracture    PT Comments    Pt with excellent progression with mobility today. Son present for discussion of DME. Son concerned pt with fatigue walking to dining hall when in independent living. With the fact pt walked 400' today I recommend the rollator/transport chair combination so that she can walk and still have a WC as needed with extreme fatigue and discussed this recommendation with pt and son. Pt encouraged to continue ambulation and HEP acutely. Will continue to follow.   Follow Up Recommendations  Home health PT;Supervision/Assistance - 24 hour     Equipment Recommendations  Other (comment) (rollator/transport chair combination)    Recommendations for Other Services       Precautions / Restrictions Precautions Precautions: Fall    Mobility  Bed Mobility   Bed Mobility: Supine to Sit     Supine to sit: Supervision     General bed mobility comments: cues for sequence with use of rail  Transfers     Transfers: Sit to/from Stand Sit to Stand: Min assist         General transfer comment: cues for hand placement, sequence and safety from chair and toilet with rail  Ambulation/Gait Ambulation/Gait  assistance: Min guard Ambulation Distance (Feet): 400 Feet Assistive device: Rolling walker (2 wheeled) Gait Pattern/deviations: Step-through pattern;Decreased stride length;Trunk flexed   Gait velocity interpretation: Below normal speed for age/gender General Gait Details: cues for posture and position in RW   Stairs            Wheelchair Mobility    Modified Rankin (Stroke Patients Only)       Balance Overall balance assessment: Needs assistance   Sitting balance-Leahy Scale: Good       Standing balance-Leahy Scale: Poor                      Cognition Arousal/Alertness: Awake/alert Behavior During Therapy: WFL for tasks assessed/performed Overall Cognitive Status: Within Functional Limits for tasks assessed                      Exercises      General Comments        Pertinent Vitals/Pain Pain Assessment: No/denies pain    Home Living                      Prior Function            PT Goals (current goals can now be found in the care plan section) Progress towards PT goals: Progressing toward goals    Frequency       PT Plan Current plan remains appropriate    Co-evaluation             End  of Session   Activity Tolerance: Patient tolerated treatment well Patient left: in chair;with call bell/phone within Marquez;with family/visitor present     Time: 1330-1402 PT Time Calculation (min) (ACUTE ONLY): 32 min  Charges:  $Gait Training: 23-37 mins                    G Codes:      Whitney Marquez 11/13/14, 2:10 PM Whitney Marquez, [place]

## 2014-10-19 NOTE — Progress Notes (Signed)
[facility] PROGRESS NOTE  Whitney Marquez FAO:[id number] DOB: Aug 28, 1923 DOA: 10/14/2014 PCP: No primary care provider on file.   Brief narrative 81 year old female with history of hypertension, hyperlipidemia, history of colon cancer status post right hemicolectomy about 16-17 years back, breast cancer in remission was brought to the ED after she had a fall and landed on her back. She had gone to see her orthopedic surgeon on the day of admission for right knee pain. She was taking some laxative with complains of constipation. Following this she had several episodes of diarrhea and was brought to the ED. In the ED a CT scan of the abdomen and pelvis showed acute colitis involving the transverse, descending in the proximal sigmoid colon. Also showed L1 compression fracture. Patient admitted for further evaluation and management.  Assessment/Plan: SIRS with Acute diffuse colitis -SIRS resolved.  -Likely to be ischemic. Stool for C. difficile negative.  Continue empiric ciprofloxacin and Flagyl. Pain control with when necessary IV morphine. (Has not required pain medications). - Diarrhea and leukocytosis improving. -Follow GI pathogen panel. -Diet advance to soft and tolerating well.   L1 acute compression fracture Patient reports having a fall after returning from orthopedics clinic on the day of admission. Reviewed Dr. Aurea Graff office note. Patient has history of compressive lumbar fracture with degenerative disc disease. PT eval and pain control for now.  Anemia Mild with pos occult blood. H&H stable  History of colon cancer and breast cancer in remission Status post right hemicolectomy in late 1990s.  Hypertension Continue amlodipine  Right knee osteoarthritis Following with Dr. Alvan Dame  DVT prophylaxis: Subcutaneous Lovenox  Diet: soft    Code Status: full code Family Communication: daughter in law at bedside Disposition Plan: Home with home health as per PT. Likely in  a.m. if continues to improve.   Consultants:  [facility]  Procedures:  CT abdomen and pelvis  Antibiotics:  Cipro and Flagyl  HPI/Subjective: No further abdominal pain. Abdominal  distention improving. Had few small episodes of diarrhea yesterday but continues to get better.   Objective: Filed Vitals:   10/19/14 0650  BP: 136/82  Pulse: 94  Temp: 97.7 F (36.5 C)  Resp: 20    Intake/Output Summary (Last 24 hours) at 10/19/14 1429 Last data filed at 10/19/14 0736  Gross per 24 hour  Intake    200 ml  Output   1220 ml  Net  -1020 ml   Filed Weights   10/15/14 1300  Weight: 64.864 kg (143 lb)    Exam:   General:   in no acute distress  HEENT:  moist oral mucosa  Chest: Clear bilaterally  CVS: Normal S1 and S2, no murmurs  Abdomen: Distended (unchanged), bowel sounds present, nontender, no guarding or rigidity   Extremities: Warm, no edema     Data Reviewed: Basic Metabolic Panel:  Recent Labs Lab 10/14/14 2337 10/16/14 0700 10/17/14 0555  NA 138 134* 137  K 3.4* 3.2* 3.8  CL 97 103 108  CO2 27 24 24   GLUCOSE 110* 120* 77  BUN 20 9 <5*  CREATININE 0.64 0.51 0.45*  CALCIUM 9.4 7.3* 7.5*   Liver Function Tests:  Recent Labs Lab 10/14/14 2337 10/16/14 0700 10/17/14 0555  AST 35 33 27  ALT 18 16 18   ALKPHOS 56 53 62  BILITOT 0.7 0.3 0.6  PROT 5.8* 4.4* 4.8*  ALBUMIN 2.9* 2.0* 2.1*   No results for input(s): LIPASE, AMYLASE in the last 168 hours. No results for input(s): AMMONIA  in the last 168 hours. CBC:  Recent Labs Lab 10/14/14 2337 10/15/14 0830 10/16/14 0700 10/17/14 0555 10/18/14 0636  WBC 26.8* 26.3* 22.3* 17.3* 15.2*  NEUTROABS 21.9* 22.3*  --  13.7*  --   HGB 11.5* 12.0 10.6* 10.8* 11.0*  HCT 34.1* 35.2* 32.3* 32.9* 33.1*  MCV 85.5 85.4 87.1 86.4 84.9  PLT 335 337 329 379 396   Cardiac Enzymes:  Recent Labs Lab 10/14/14 2337  TROPONINI 0.03   BNP (last 3 results) No results for input(s): PROBNP in the  last 8760 hours. CBG:  Recent Labs Lab 10/17/14 1211  GLUCAP 114*    Recent Results (from the past 240 hour(s))  Urine culture     Status: None   Collection Time: 10/15/14  1:25 AM  Result Value Ref Range Status   Specimen Description URINE, CLEAN CATCH  Final   Special Requests NONE  Final   Colony Count NO GROWTH Performed at [facility]   Final   Culture NO GROWTH Performed at [facility]   Final   Report Status 10/16/2014 FINAL  Final  Clostridium Difficile by PCR     Status: None   Collection Time: 10/15/14  6:42 AM  Result Value Ref Range Status   C difficile by pcr NEGATIVE NEGATIVE Final  Stool culture     Status: None   Collection Time: 10/15/14  8:05 AM  Result Value Ref Range Status   Specimen Description STOOL  Final   Special Requests NONE  Final   Culture   Final    NO SALMONELLA, SHIGELLA, CAMPYLOBACTER, YERSINIA, OR E.COLI 0157:H7 ISOLATED Performed at [facility]    Report Status 10/19/2014 FINAL  Final     Studies: No results found.  Scheduled Meds: . amLODipine  5 mg Oral Daily  . ciprofloxacin  400 mg Intravenous Q12H  . fenofibrate  54 mg Oral Daily  . metronidazole  500 mg Intravenous Q8H  . saccharomyces boulardii  250 mg Oral BID   Continuous Infusions: . sodium chloride 0.9 % 1,000 mL with potassium chloride 40 mEq infusion 75 mL/hr at 10/18/14 1946       Time spent: 25 minutes    Louellen Molder  [facility] Pager [phone] If 7PM-7AM, please contact night-coverage at [email or web address], password [facility] 10/19/2014, 2:29 PM  LOS: 5 days

## 2014-10-20 DIAGNOSIS — A419 Sepsis, unspecified organism: Secondary | ICD-10-CM

## 2014-10-20 DIAGNOSIS — R6510 Systemic inflammatory response syndrome (SIRS) of non-infectious origin without acute organ dysfunction: Secondary | ICD-10-CM | POA: Diagnosis present

## 2014-10-20 LAB — GI PATHOGEN PANEL BY PCR, STOOL
C DIFFICILE TOXIN A/B: NOT DETECTED
CRYPTOSPORIDIUM BY PCR: NOT DETECTED
Campylobacter by PCR: NOT DETECTED
E COLI (ETEC) LT/ST: NOT DETECTED
E COLI 0157 BY PCR: NOT DETECTED
E coli (STEC): NOT DETECTED
G lamblia by PCR: NOT DETECTED
Norovirus GI/GII: NOT DETECTED
Rotavirus A by PCR: NOT DETECTED
Salmonella by PCR: NOT DETECTED
Shigella by PCR: NOT DETECTED

## 2014-10-20 MED ORDER — TUBERCULIN PPD 5 UNIT/0.1ML ID SOLN
5.0000 [IU] | Freq: Once | INTRADERMAL | Status: DC
  Administered 2014-10-20: 5 [IU] via INTRADERMAL
  Filled 2014-10-20 (×2): qty 0.1

## 2014-10-20 MED ORDER — METRONIDAZOLE 500 MG PO TABS: 500.0000 mg | ORAL_TABLET | Freq: Three times a day (TID) | ORAL | Status: AC

## 2014-10-20 MED ORDER — TRAMADOL HCL 50 MG PO TABS: 50.0000 mg | ORAL_TABLET | Freq: Four times a day (QID) | ORAL | Status: DC | PRN

## 2014-10-20 MED ORDER — CIPROFLOXACIN HCL 250 MG PO TABS
250.0000 mg | ORAL_TABLET | Freq: Two times a day (BID) | ORAL | Status: DC
  Filled 2014-10-20 (×2): qty 1

## 2014-10-20 MED ORDER — METRONIDAZOLE 500 MG PO TABS
500.0000 mg | ORAL_TABLET | Freq: Three times a day (TID) | ORAL | Status: DC
  Administered 2014-10-20: 500 mg via ORAL
  Filled 2014-10-20 (×3): qty 1

## 2014-10-20 MED ORDER — CIPROFLOXACIN HCL 250 MG PO TABS: 250.0000 mg | ORAL_TABLET | Freq: Two times a day (BID) | ORAL | Status: DC

## 2014-10-20 NOTE — Progress Notes (Signed)
CARE MANAGEMENT NOTE 10/20/2014  Patient:  JOYLEEN, HASELTON   Account Number:  [id number]  Date Initiated:  10/15/2014  Documentation initiated by:  Tomi Bamberger  Subjective/Objective Assessment:   dx colits, sirs, diarrhea  admit- from home.     Action/Plan:   pt eval- rec hhpt   Anticipated DC Date:  10/20/2014   Anticipated DC Plan:  ASSISTED LIVING / [place]  CM consult      [facility] Choice  HOME HEALTH   Choice offered to / List presented to:  C-1 Patient           Status of service:  Completed, signed off Medicare Important Message given?  YES (If response is "NO", the following Medicare IM given date fields will be blank) Date Medicare IM given:  10/16/2014 Medicare IM given by:  Tomi Bamberger Date Additional Medicare IM given:  10/19/2014 Additional Medicare IM given by:  Tomi Bamberger  Discharge Disposition:  ASSISTED LIVING  Per UR Regulation:  Reviewed for med. necessity/level of care/duration of stay  If discussed at [place] of Stay Meetings, dates discussed:    Comments:  10/20/2014 [facility] provides PT at their facility. Orders added to dc paperwork to ALF. Jonnie Finner RN CCM Case Mgmt phone [phone]  10/15/14 [place], BSN [phone]  patient is from home, NCM will cont to follow for dc needs.

## 2014-10-20 NOTE — Progress Notes (Signed)
REPORT CALLED TO CHERIE AT [facility].

## 2014-10-20 NOTE — Clinical Social Work Psychosocial (Signed)
Clinical Social Work Department BRIEF PSYCHOSOCIAL ASSESSMENT 10/20/2014  Patient:  Whitney Marquez, Whitney Marquez     Account Number:  [id number]     Admit date:  10/14/2014  Clinical Social Worker:  Lovey Newcomer  Date/Time:  10/20/2014 07:57 AM  Referred by:  Physician  Date Referred:  10/20/2014 Referred for  SNF Placement   Other Referral:   NA   Interview type:  Patient Other interview type:   NA    PSYCHOSOCIAL DATA Living Status:  ALONE Admitted from facility:   Level of care:   Primary support name:  Whitney Marquez and Whitney Marquez Primary support relationship to patient:  CHILD, ADULT Degree of support available:   Support is strong.    CURRENT CONCERNS Current Concerns  Post-Acute Placement   Other Concerns:   NA    SOCIAL WORK ASSESSMENT / PLAN CSW met with patient and patient's son at bedside to complete assessment. Patient states that she has been working on getting into [facility] and IDL prior to admission. Facility states that they will take patient for ALF initially upon DC and transition her to IDL once appropriate. CSW explained role and process of placement. CSW will assist. Patient was calm and engaged in assessment.   Assessment/plan status:  Psychosocial Support/Ongoing Assessment of Needs Other assessment/ plan:   Complete Fl2, Fax, ?PASRR   Information/referral to community resources:   CSW contact information given.    PATIENT'S/FAMILY'S RESPONSE TO PLAN OF CARE: Patient and patient's son plan for the patient to DC to ALF once medically stable. CSW will assist.       Liz Beach MSW, [place], [place], [phone]

## 2014-10-20 NOTE — Discharge Summary (Signed)
Physician Discharge Summary  Whitney Marquez YBO:[id number] DOB: 11-11-1922 DOA: 10/14/2014  PCP: Lajean Manes , MD   Admit date: 10/14/2014 Discharge date: 10/20/2014  Time spent: 35 minutes  Recommendations for Outpatient Follow-up:  1. Discharge home with [facility]. Pt will be moving to [facility] in 2 weeks and then to independent living there.  2. Follow up with Dr Michail Sermon in 3-4 weeks   Discharge Diagnoses:  Principal Problem:   SIRS due to Acute ischemic colitis   Active Problems:   Acute Compression fracture of L1 lumbar vertebra with routine healing   Anemia   Essential hypertension   Hyperlipidemia   Hypokalemia    Discharge Condition: Fair  Diet recommendation: Soft diet for next 4-5 days then transition to regular as tolerated.  Code status: Full code  Filed Weights   10/15/14 1300  Weight: 64.864 kg (143 lb)    History of present illness:  Please refer to admission H&P for details, but in brief, 81 year old female with history of hypertension, hyperlipidemia, history of colon cancer status post right hemicolectomy about 16-17 years back, breast cancer in remission was brought to the ED after she had a fall and landed on her back. She had gone to see her orthopedic surgeon on the day of admission for right knee pain. She was taking some laxative with complains of constipation. Following this she had several episodes of diarrhea and was brought to the ED. In the ED a CT scan of the abdomen and pelvis showed acute colitis involving the transverse, descending in the proximal sigmoid colon. Also showed L1 compression fracture. Patient admitted for further evaluation and management.  Hospital Course:  SIRS with Acute diffuse colitis -SIRS resolved.  -Likely due to ischemic colitis.. Stool for C. difficile negative. Continue empiric ciprofloxacin and Flagyl. (Will complete 7 day course on 10/21/2014) -abdominal  pain resolved. Diarrhea and leukocytosis  improved. -Follow GI pathogen panel as outpatient. -Diet advance to soft and tolerating well. -patient is stable to be discharged home on soft diet for next few days and advance as tolerated.      L1 acute compression fracture, acute Patient reports having a fall after returning from orthopedics clinic on the day of admission. Reviewed Dr. Aurea Graff office note. Patient has history of compressive lumbar fracture with degenerative disc disease.  -Patient seen by physical therapy and recommended home health with supervision. Has not required pain medications for the last 2 days and has tramadol at home for prn use.  Patient is going to [facility] from home in 1-2 weeks and then admission to their independent living. She should follow-up with Dr. Alvan Dame as outpatient  Anemia Mild with pos occult blood. H&H stable  History of colon cancer and breast cancer in remission Status post right hemicolectomy in late 1990s.  Hypertension Continue amlodipine  Right knee osteoarthritis Following with Dr. Alvan Dame. Continue  home medications.        Code Status: full code  Family Communication: son at bedside  Disposition Plan: Home with home health PT   Consultants:  [facility]  Procedures:  CT abdomen and pelvis  Antibiotics:  Cipro and Flagyl (complete 7 days course on 10/21/2014)    Discharge Exam: Filed Vitals:   10/20/14 0633  BP: 144/82  Pulse: 90  Temp: 97.7 F (36.5 C)  Resp: 18    General: Early female in no acute distress HEENT: No pallor, moist oral mucosa, neck supple Chest: Clear to auscultation bilaterally, no added sounds CVS:  Normal S1 and S2, no murmurs rub or gallop Gastrointestinal: Soft, mild distention, nontender, bowel sounds present Musculoskeletal: Warm, no edema CNS: Alert and oriented   Discharge Instructions    Current Discharge Medication List    START taking these medications   Details  ciprofloxacin (CIPRO)  250 MG tablet Take 1 tablet (250 mg total) by mouth 2 (two) times daily. Qty: 4 tablet, Refills: 0    metroNIDAZOLE (FLAGYL) 500 MG tablet Take 1 tablet (500 mg total) by mouth every 8 (eight) hours. Qty: 6 tablet, Refills: 0      CONTINUE these medications which have NOT CHANGED   Details  alendronate (FOSAMAX) 70 MG tablet Take 70 mg by mouth once a week. Take with a full glass of water on an empty stomach.    amLODipine (NORVASC) 5 MG tablet Take 5 mg by mouth daily.    aspirin EC 81 MG tablet Take 81 mg by mouth daily.    Calcium-Vitamin D-Vitamin K 500-100-40 MG-UNT-MCG CHEW Chew 1 tablet by mouth daily.         fenofibrate 54 MG tablet Take 54 mg by mouth daily.    MELATONIN PO Take 1 tablet by mouth at bedtime as needed (for sleep).    Multiple Vitamins-Minerals (CENTRUM SILVER PO) Take 1 tablet by mouth daily.    Polyvinyl Alcohol-Povidone (REFRESH OP) Apply 1-2 drops to eye at bedtime as needed (for dry eyes).    traMADol (ULTRAM) 50 MG tablet Take 50 mg by mouth every 6 (six) hours as needed for moderate pain or severe pain.    omeprazole (PRILOSEC) 20 MG capsule Take 20 mg by mouth daily.      STOP taking these medications     polyethylene glycol (MIRALAX / GLYCOLAX) packet   docusate sodium (COLACE) 100 MG capsule        Allergies  Allergen Reactions  . Cephalosporins Anaphylaxis  . Other Swelling    Walnuts causes lips swelling  . Macrobid [Nitrofurantoin Monohyd Macro] Swelling and Rash  . Penicillins Hives and Rash  . Shellfish Allergy Rash      The results of significant diagnostics from this hospitalization (including imaging, microbiology, ancillary and laboratory) are listed below for reference.    Significant Diagnostic Studies: Dg Chest 2 View  10/15/2014   CLINICAL DATA:  Status post fall. Concern for chest injury. Initial encounter.  EXAM: CHEST  2 VIEW  COMPARISON:  None.  FINDINGS: The lungs are hypoexpanded. There is elevation of the  right hemidiaphragm. Vascular crowding and vascular congestion are seen. Minimal right mid lung opacity and left basilar opacity may reflect atelectasis or possibly pneumonia. There is no evidence of pleural effusion or pneumothorax.  The heart is borderline enlarged. There appears to be an acute compression fracture involving L1; cortical irregularity at the inferior endplate of L2 appears to reflect remote injury, on correlation with subsequent CT. Mild anterolisthesis at the mid cervical spine is thought to be chronic in nature. Clips are noted at the right axilla.  IMPRESSION: 1. Acute compression fracture involving the superior endplate of L1, noted on subsequent CT. 2. Lungs hypoexpanded, with elevation of the right hemidiaphragm. Minimal right mid lung and left basilar opacity may reflect atelectasis or possibly pneumonia. 3. No displaced rib fracture seen. 4. Mild vascular congestion and borderline cardiomegaly.   Electronically Signed   By: Garald Balding M.D.   On: 10/15/2014 03:14   Dg Abd 1 View  10/17/2014   CLINICAL DATA:  Colitis.  EXAM: ABDOMEN - 1 VIEW  COMPARISON:  None.  FINDINGS: The bowel gas pattern is normal. No radio-opaque calculi or other significant radiographic abnormality are seen. Pessary ring noted in the vagina. Spine degenerative changes and scoliosis noted.  IMPRESSION: Normal bowel gas pattern.  No acute findings.   Electronically Signed   By: Earle Gell M.D.   On: 10/17/2014 10:17   Ct Abdomen Pelvis W Contrast  10/15/2014   CLINICAL DATA:  Acute onset of constipation for 3 days. Generalized weakness. Status post fall. Initial encounter.  EXAM: CT ABDOMEN AND PELVIS WITH CONTRAST  TECHNIQUE: Multidetector CT imaging of the abdomen and pelvis was performed using the standard protocol following bolus administration of intravenous contrast.  CONTRAST:  158mL OMNIPAQUE IOHEXOL 300 MG/ML  SOLN  COMPARISON:  None.  FINDINGS: Mild bibasilar atelectasis or scarring is noted.  Scattered calcified mediastinal nodes likely reflect remote granulomatous disease. A tiny hiatal hernia is noted. Diffuse coronary artery calcifications are seen. There is dense calcification at the aortic and mitral valves.  There is no evidence of solid or hollow organ injury.  Decreased attenuation at medial hepatic dome is thought to be artifactual in nature. No definite hepatic laceration is seen. The spleen is diminutive, with a single calcified granuloma noted. There is suggestion of a small stone at the base of the gallbladder. The gallbladder is otherwise unremarkable. The pancreas and adrenal glands are unremarkable.  Mild nonspecific perinephric stranding is noted bilaterally. The kidneys are otherwise unremarkable. There is no evidence of hydronephrosis. No renal or ureteral stones are seen.  Mucosal edema and wall thickening is noted along the transverse, descending and proximal sigmoid colon, compatible with colitis. Associated soft tissue inflammation and trace free fluid is seen. There is no evidence of perforation or abscess formation at this time.  Contrast progresses to the level of the rectum. The sigmoid colon is somewhat redundant. Scattered diverticulosis is noted along the distal descending and proximal sigmoid colon.  The small bowel is unremarkable in appearance. The stomach is within normal limits. No acute vascular abnormalities are seen.  There is borderline aneurysmal dilatation of the infrarenal abdominal aorta, measuring 2.6 cm in AP dimension and 3.1 cm in transverse dimension. Diffuse calcification is seen along the abdominal aorta and its branches, including at the origins of the renal arteries bilaterally. Aneurysmal dilatation resolves proximal to the level of the aortic bifurcation.  The appendix is not definitely seen. There is no evidence of appendicitis.  The bladder is moderately distended and grossly unremarkable. The uterus is within normal limits. The ovaries are grossly  symmetric. No suspicious adnexal masses are seen. A vaginal pessary is noted. A few calcified epiploic appendages are seen at the upper pelvis. No inguinal lymphadenopathy is seen.  There is an acute compression fracture involving the superior endplate of L1, with approximately 30% loss of height. There is approximately 5 mm of retropulsion. There is no evidence of extension to the posterior elements. There is mild chronic compression deformity involving the inferior endplate of L2. Endplate sclerotic change is noted at the lower lumbar spine, with associated disc space narrowing. Vacuum phenomenon is noted at L2-L3 and L3-L4.  IMPRESSION: 1. Acute colitis involving the transverse, descending and proximal sigmoid colon, with mucosal edema and wall thickening, and surrounding soft tissue inflammation and trace fluid. No evidence of perforation or abscess formation at this time. This may be infectious or inflammatory in nature; there is no definite evidence of ischemia. 2. Acute compression fracture  involving the superior endplate of L1, with approximately 30% loss of height. There is approximately 5 mm of retropulsion. No evidence of extension to the posterior elements. 3. Borderline aneurysmal dilatation of the infrarenal abdominal aorta, measuring 2.6 cm in AP dimension and 3.1 cm in transverse dimension. Diffuse calcification along the abdominal aorta and its branches, including at the origins of the renal arteries. Recommend followup by ultrasound in 3 years. This recommendation follows ACR consensus guidelines: White Paper of the ACR Incidental Findings Committee II on Vascular Findings. J Am Coll Radiol 2013; 67:124-580 4. Diffuse coronary artery calcifications noted. Dense calcification at the aortic and mitral valves. 5. Cholelithiasis; gallbladder otherwise unremarkable. 6. Scattered diverticulosis along the distal descending and proximal sigmoid colon, without evidence of diverticulitis. 7. Mild bibasilar  atelectasis or scarring noted. Scattered calcified mediastinal nodes likely reflect remote granulomatous disease. 8. Mild degenerative change noted along the lower lumbar spine.   Electronically Signed   By: Garald Balding M.D.   On: 10/15/2014 03:32   Dg Knee Complete 4 Views Right  10/15/2014   CLINICAL DATA:  Status post fall, with anterior right knee pain. Initial encounter.  EXAM: RIGHT KNEE - COMPLETE 4+ VIEW  COMPARISON:  None.  FINDINGS: There is no evidence of fracture or dislocation. The joint spaces are preserved. Small marginal osteophytes are seen at the lateral compartment. There is minimal cortical irregularity at the upper pole of the patella. A fabella is noted.  No significant joint effusion is seen. Scattered vascular calcifications are seen.  IMPRESSION: 1. No evidence of fracture or dislocation. 2. Minimal degenerative change at the lateral compartment. 3. Scattered vascular calcifications seen.   Electronically Signed   By: Garald Balding M.D.   On: 10/15/2014 03:04   Dg Hip Unilat With Pelvis 2-3 Views Left  10/15/2014   CLINICAL DATA:  Status post fall, with acute onset of left posterior hip pain. Initial encounter.  EXAM: DG HIP W/ PELVIS 2-3V*L*  COMPARISON:  None.  FINDINGS: There is no evidence of fracture or dislocation. Both femoral heads are seated normally within their respective acetabula. The proximal left femur appears intact. Disc space narrowing is noted at L5-S1, with associated endplate sclerotic change. The sacroiliac joints are unremarkable in appearance.  Contrast is noted within the distal small bowel and cecum.  IMPRESSION: No evidence of fracture or dislocation.   Electronically Signed   By: Garald Balding M.D.   On: 10/15/2014 03:08    Microbiology: Recent Results (from the past 240 hour(s))  Urine culture     Status: None   Collection Time: 10/15/14  1:25 AM  Result Value Ref Range Status   Specimen Description URINE, CLEAN CATCH  Final   Special Requests  NONE  Final   Colony Count NO GROWTH Performed at [facility]   Final   Culture NO GROWTH Performed at [facility]   Final   Report Status 10/16/2014 FINAL  Final  Clostridium Difficile by PCR     Status: None   Collection Time: 10/15/14  6:42 AM  Result Value Ref Range Status   C difficile by pcr NEGATIVE NEGATIVE Final  Stool culture     Status: None   Collection Time: 10/15/14  8:05 AM  Result Value Ref Range Status   Specimen Description STOOL  Final   Special Requests NONE  Final   Culture   Final    NO SALMONELLA, SHIGELLA, CAMPYLOBACTER, YERSINIA, OR E.COLI 0157:H7 ISOLATED Performed at [facility]    Report Status 10/19/2014 FINAL  Final     Labs: Basic Metabolic Panel:  Recent Labs Lab 10/14/14 2337 10/16/14 0700 10/17/14 0555  NA 138 134* 137  K 3.4* 3.2* 3.8  CL 97 103 108  CO2 27 24 24   GLUCOSE 110* 120* 77  BUN 20 9 <5*  CREATININE 0.64 0.51 0.45*  CALCIUM 9.4 7.3* 7.5*   Liver Function Tests:  Recent Labs Lab 10/14/14 2337 10/16/14 0700 10/17/14 0555  AST 35 33 27  ALT 18 16 18   ALKPHOS 56 53 62  BILITOT 0.7 0.3 0.6  PROT 5.8* 4.4* 4.8*  ALBUMIN 2.9* 2.0* 2.1*   No results for input(s): LIPASE, AMYLASE in the last 168 hours. No results for input(s): AMMONIA in the last 168 hours. CBC:  Recent Labs Lab 10/14/14 2337 10/15/14 0830 10/16/14 0700 10/17/14 0555 10/18/14 0636  WBC 26.8* 26.3* 22.3* 17.3* 15.2*  NEUTROABS 21.9* 22.3*  --  13.7*  --   HGB 11.5* 12.0 10.6* 10.8* 11.0*  HCT 34.1* 35.2* 32.3* 32.9* 33.1*  MCV 85.5 85.4 87.1 86.4 84.9  PLT 335 337 329 379 396   Cardiac Enzymes:  Recent Labs Lab 10/14/14 2337  TROPONINI 0.03   BNP: BNP (last 3 results) No results for input(s): PROBNP in the last 8760 hours. CBG:  Recent Labs Lab 10/17/14 1211  GLUCAP 114*       Signed:  Atzel Mccambridge  [facility] 10/20/2014, 10:00 AM

## 2014-10-20 NOTE — Progress Notes (Signed)
  TB intradermal given in inner right FA.  Control #[id number] Expires 10/23/16 DEA [id number]

## 2014-10-20 NOTE — Discharge Instructions (Addendum)
Soft-Food Meal Plan A soft-food meal plan includes foods that are safe and easy to swallow. This meal plan typically is used:  If you are having trouble chewing or swallowing foods.  As a transition meal plan after only having had liquid meals for a long period. WHAT DO I NEED TO KNOW ABOUT THE SOFT-FOOD MEAL PLAN? A soft-food meal plan includes tender foods that are soft and easy to chew and swallow. In most cases, bite-sized pieces of food are easier to swallow. A bite-sized piece is about  inch or smaller. Foods in this plan do not need to be ground or pureed. Foods that are very hard, crunchy, or sticky should be avoided. Also, breads, cereals, yogurts, and desserts with nuts, seeds, or fruits should be avoided. WHAT FOODS CAN I EAT? Grains Rice and wild rice. Moist bread, dressing, pasta, and noodles. Well-moistened dry or cooked cereals, such as farina (cooked wheat cereal), oatmeal, or grits. Biscuits, breads, muffins, pancakes, and waffles that have been well moistened. Vegetables Shredded lettuce. Cooked, tender vegetables, including potatoes without skins. Vegetable juices. Broths or creamed soups made with vegetables that are not stringy or chewy. Strained tomatoes (without seeds). Fruits Canned or well-cooked fruits. Soft (ripe), peeled fresh fruits, such as peaches, nectarines, kiwi, cantaloupe, honeydew melon, and watermelon (without seeds). Soft berries with small seeds, such as strawberries. Fruit juices (without pulp). Meats and Other Protein Sources Moist, tender, lean beef. Mutton. Lamb. Veal. Chicken. [place]. Liver. Ham. Fish without bones. Eggs. Dairy Milk, milk drinks, and cream. Plain cream cheese and cottage cheese. Plain yogurt. Sweets/Desserts Flavored gelatin desserts. Custard. Plain ice cream, frozen yogurt, sherbet, milk shakes, and malts. Plain cakes and cookies. Plain hard candy.  Other Butter, margarine (without trans fat), and cooking oils. Mayonnaise. Cream  sauces. Mild spices, salt, and sugar. Syrup, molasses, honey, and jelly. The items listed above may not be a complete list of recommended foods or beverages. Contact your dietitian for more options. WHAT FOODS ARE NOT RECOMMENDED? Grains Dry bread, toast, crackers that have not been moistened. Coarse or dry cereals, such as bran, granola, and shredded wheat. Tough or chewy crusty breads, such as [place] bread or baguettes. Vegetables Corn. Raw vegetables except shredded lettuce. Cooked vegetables that are tough or stringy. Tough, crisp, fried potatoes and potato skins. Fruits Fresh fruits with skins or seeds or both, such as apples, pears, or grapes. Stringy, high-pulp fruits, such as papaya, pineapple, coconut, or mango. Fruit leather, fruit roll-ups, and all dried fruits. Meats and Other Protein Sources Sausages and hot dogs. Meats with gristle. Fish with bones. Nuts, seeds, and chunky peanut or other nut butters. Sweets/Desserts Cakes or cookies that are very dry or chewy.  The items listed above may not be a complete list of foods and beverages to avoid. Contact your dietitian for more information. Document Released: 12/26/2007 Document Revised: 09/23/2013 Document Reviewed: 08/15/2013 [facility] Patient Information 2015 [place], [place]. This information is not intended to replace advice given to you by your health care provider. Make sure you discuss any questions you have with your health care provider.    Colitis Colitis is inflammation of the colon. Colitis can be a short-term or long-standing (chronic) illness. Crohn's disease and ulcerative colitis are 2 types of colitis which are chronic. They usually require lifelong treatment. CAUSES  There are many different causes of colitis, including:  Viruses.  Germs (bacteria).  Medicine reactions. SYMPTOMS   Diarrhea.  Intestinal bleeding.  Pain.  Fever.  Throwing up (vomiting).  Tiredness (fatigue).  Weight loss.  Bowel  blockage. DIAGNOSIS  The diagnosis of colitis is based on examination and stool or blood tests. X-rays, CT scan, and colonoscopy may also be needed. TREATMENT  Treatment may include:  Fluids given through the vein (intravenously).  Bowel rest (nothing to eat or drink for a period of time).  Medicine for pain and diarrhea.  Medicines (antibiotics) that kill germs.  Cortisone medicines.  Surgery. HOME CARE INSTRUCTIONS   Get plenty of rest.  Drink enough water and fluids to keep your urine clear or pale yellow.  Eat a well-balanced diet.  Call your caregiver for follow-up as recommended. SEEK IMMEDIATE MEDICAL CARE IF:   You develop chills.  You have an oral temperature above 102 F (38.9 C), not controlled by medicine.  You have extreme weakness, fainting, or dehydration.  You have repeated vomiting.  You develop severe belly (abdominal) pain or are passing bloody or tarry stools. MAKE SURE YOU:   Understand these instructions.  Will watch your condition.  Will get help right away if you are not doing well or get worse. Document Released: 10/26/2004 Document Revised: 12/11/2011 Document Reviewed: 01/21/2010 [facility] Patient Information 2015 [place], [place]. This information is not intended to replace advice given to you by your health care provider. Make sure you discuss any questions you have with your health care provider.

## 2014-10-20 NOTE — Clinical Social Work Note (Signed)
Per MD patient ready to [facility]. RN, patient/family, and facility notified of patient's DC. RN given number for report. DC packet on patient's chart. Patient will be transported by security at [facility] after 3:30PM. [place] signing off at this time.   Liz Beach MSW, [place], [place], [phone]

## 2014-11-02 ENCOUNTER — Ambulatory Visit (INDEPENDENT_AMBULATORY_CARE_PROVIDER_SITE_OTHER): Payer: Medicare Other | Admitting: Podiatry

## 2014-11-02 DIAGNOSIS — L84 Corns and callosities: Secondary | ICD-10-CM | POA: Diagnosis not present

## 2014-11-02 DIAGNOSIS — B351 Tinea unguium: Secondary | ICD-10-CM

## 2014-11-02 DIAGNOSIS — M79673 Pain in unspecified foot: Secondary | ICD-10-CM

## 2014-11-02 NOTE — Progress Notes (Signed)
Subjective:     Patient ID: Whitney Marquez [place], female   DOB: [birth year], 81 y.o.   MRN: [id number]  HPI patient presents with painful nailbeds 1-5 both feet that are sore and thick and also presents with plantar calluses that are very thick sore and hard for her to walk with   Review of Systems     Objective:   Physical Exam Neurovascular status intact with thick yellow mycotic nail infections 1-5 both feet that are painful and also noted to have plantar calluses of both feet    Assessment:     Mycotic nail infection with pain and lesion formation    Plan:     Debrided nailbeds 1-5 both feet and debridement lesions with no iatrogenic bleeding noted

## 2014-11-02 NOTE — Progress Notes (Signed)
Subjective:     Patient ID: Whitney Marquez, female   DOB: 05-Sep-1923, 81 y.o.   MRN: [id number]  HPI patient presents stating I'm having a lot of pain with my right plantar foot and have trouble walking but I have done well with inserts   Review of Systems     Objective:   Physical Exam Neurovascular status unchanged with no other issues noted and found to have inflammation and pain around the fifth metatarsal right    Assessment:     Inflammatory capsulitis with keratotic lesion formation    Plan:     H&P and condition discussed at great length today I injected the plantar capsule 3 mg Kenalog 5 of Xylocaine and debris did lesion continue orthotics and reappoint when symptoms occur

## 2015-01-04 ENCOUNTER — Ambulatory Visit (INDEPENDENT_AMBULATORY_CARE_PROVIDER_SITE_OTHER): Payer: Medicare Other | Admitting: Podiatry

## 2015-01-04 ENCOUNTER — Ambulatory Visit: Payer: Medicare Other

## 2015-01-04 DIAGNOSIS — M79673 Pain in unspecified foot: Secondary | ICD-10-CM

## 2015-01-04 DIAGNOSIS — L84 Corns and callosities: Secondary | ICD-10-CM

## 2015-01-04 DIAGNOSIS — B351 Tinea unguium: Secondary | ICD-10-CM

## 2015-01-04 NOTE — Progress Notes (Signed)
Subjective:     Patient ID: Whitney Marquez, female   DOB: 1923-09-09, 81 y.o.   MRN: [id number]  HPI patient presents with painful nailbeds 1-5 both feet that are sore and thick and also presents with plantar calluses that are very thick sore and hard for her to walk with   Review of Systems     Objective:   Physical Exam Neurovascular status intact with thick yellow mycotic nail infections 1-5 both feet that are painful and also noted to have plantar calluses of both feet    Assessment:     Mycotic nail infection with pain and lesion formation    Plan:     Debrided nailbeds 1-5 both feet and debridement lesions with no iatrogenic bleeding noted

## 2015-04-19 ENCOUNTER — Ambulatory Visit: Payer: Medicare Other

## 2015-04-20 ENCOUNTER — Encounter: Payer: Self-pay | Admitting: Podiatry

## 2015-04-20 ENCOUNTER — Ambulatory Visit (INDEPENDENT_AMBULATORY_CARE_PROVIDER_SITE_OTHER): Payer: Medicare Other | Admitting: Podiatry

## 2015-04-20 DIAGNOSIS — B351 Tinea unguium: Secondary | ICD-10-CM | POA: Diagnosis not present

## 2015-04-20 DIAGNOSIS — M79673 Pain in unspecified foot: Secondary | ICD-10-CM | POA: Diagnosis not present

## 2015-04-20 NOTE — Progress Notes (Signed)
Patient ID: Whitney Marquez, female   DOB: Jan 11, 1923, 81 y.o.   MRN: [id number] Complaint:  Visit Type: Patient returns to my office for continued preventative foot care services. Complaint: Patient states" my nails have grown long and thick and become painful to walk and wear shoes.. She presents for preventative foot care services. No changes to ROS.  Painful callus under big toe joint right foot.  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  Podiatric Exam: Vascular: dorsalis pedis and posterior tibial pulses are palpable bilateral. Capillary return is immediate. Temperature gradient is WNL. Skin turgor WNL  Sensorium: Normal Semmes Weinstein monofilament test. Normal tactile sensation bilaterally. Nail Exam: Pt has thick disfigured discolored nails with subungual debris noted bilateral entire nail hallux through fifth toenails Ulcer Exam: There is no evidence of ulcer or pre-ulcerative changes or infection. Orthopedic Exam: Muscle tone and strength are WNL. No limitations in general ROM. No crepitus or effusions noted. Foot type and digits show no abnormalities. Bony prominences are unremarkable. Skin: No Porokeratosis. No infection or ulcers  Diagnosis:  Tinea unguium, Pain in right toe, pain in left toes  Treatment & Plan Procedures and Treatment: Consent by patient was obtained for treatment procedures. The patient understood the discussion of treatment and procedures well. All questions were answered thoroughly reviewed. Debridement of mycotic and hypertrophic toenails, 1 through 5 bilateral and clearing of subungual debris. No ulceration, no infection noted.  Return Visit-Office Procedure: Patient instructed to return to the office for a follow up visit 3 months for continued evaluation and treatment.

## 2015-06-17 IMAGING — CT CT ABD-PELV W/ CM
2 of 5 series · 9 of 46 positions shown, 10 images · IV contrast (Iodine)
Comparison: None.

CLINICAL DATA: Acute onset of constipation for 3 days. Generalized
weakness. Status post fall. Initial encounter.

EXAM:
CT ABDOMEN AND PELVIS WITH CONTRAST
TECHNIQUE: Multidetector CT imaging of the abdomen and pelvis was performed
using the standard protocol following bolus administration of
intravenous contrast.
CONTRAST:  100mL OMNIPAQUE IOHEXOL 300 MG/ML  SOLN

[Series 201: routine, idose (2) · axial · 0.78mm/px · z∈[+116,+466]mm · 6 of 92 slices shown, 7 images]
[im 11/92  soft-tissue]
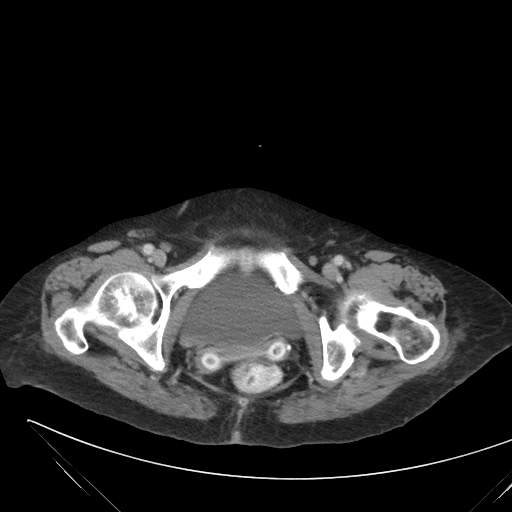
[im 11/92  bone]
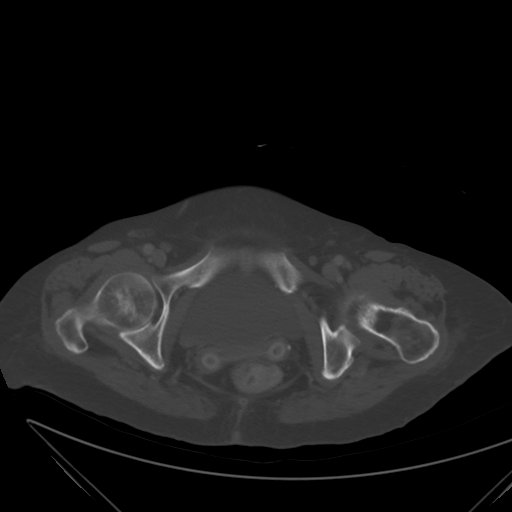
[im 27/92  soft-tissue]
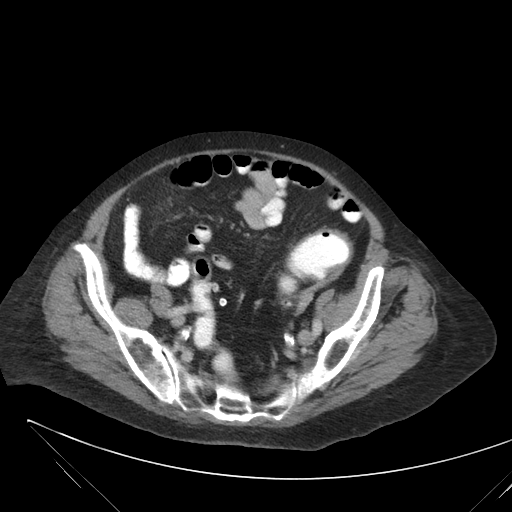
[im 38/92  soft-tissue]
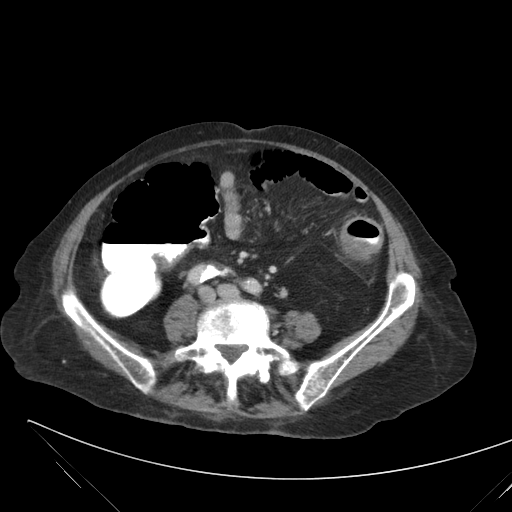
[im 54/92  soft-tissue]
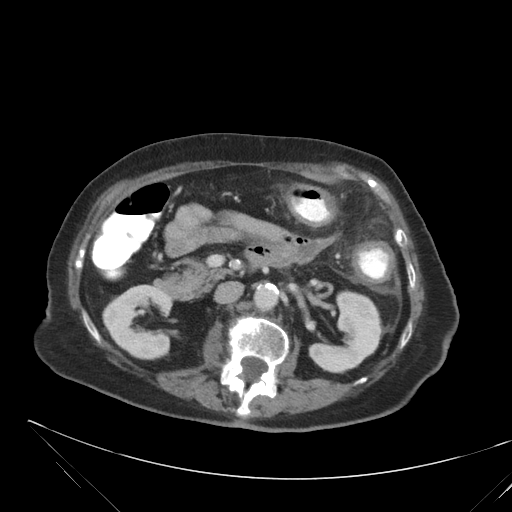
[im 70/92  soft-tissue]
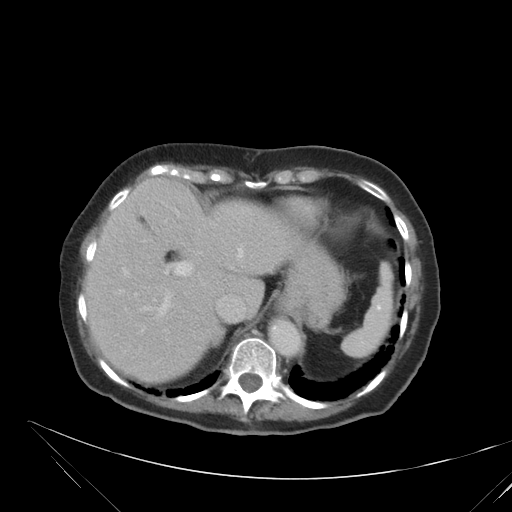
[im 81/92  soft-tissue]
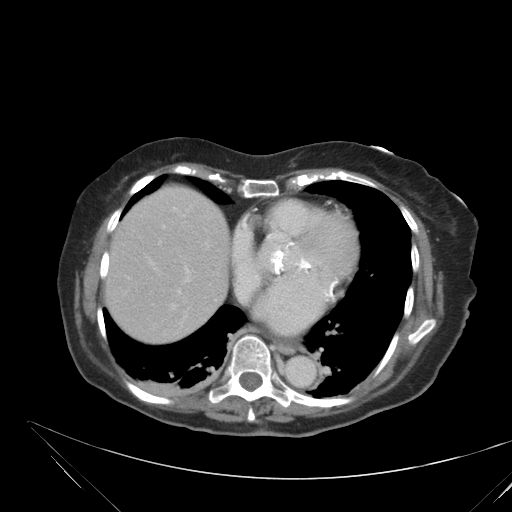

[Series 202: coronals, idose (2) · coronal · 0.45mm/px · 3 of 92 slices shown]
[im 31/92  soft-tissue]
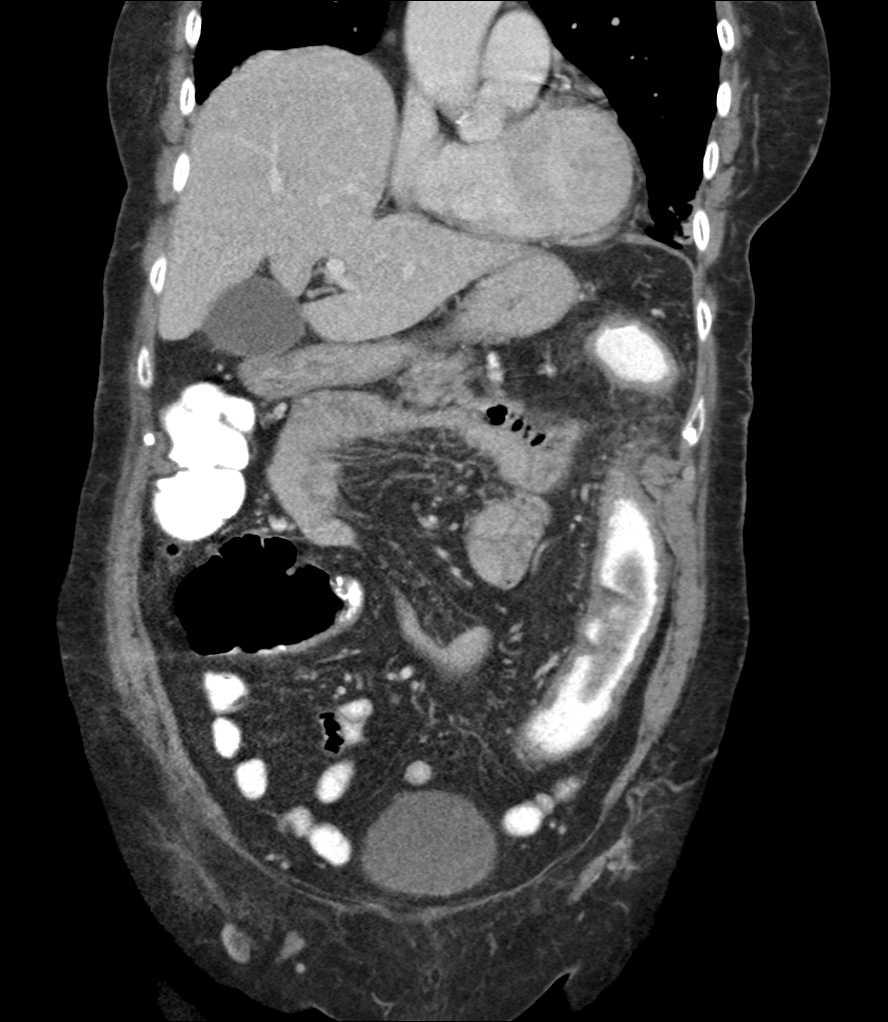
[im 41/92  soft-tissue]
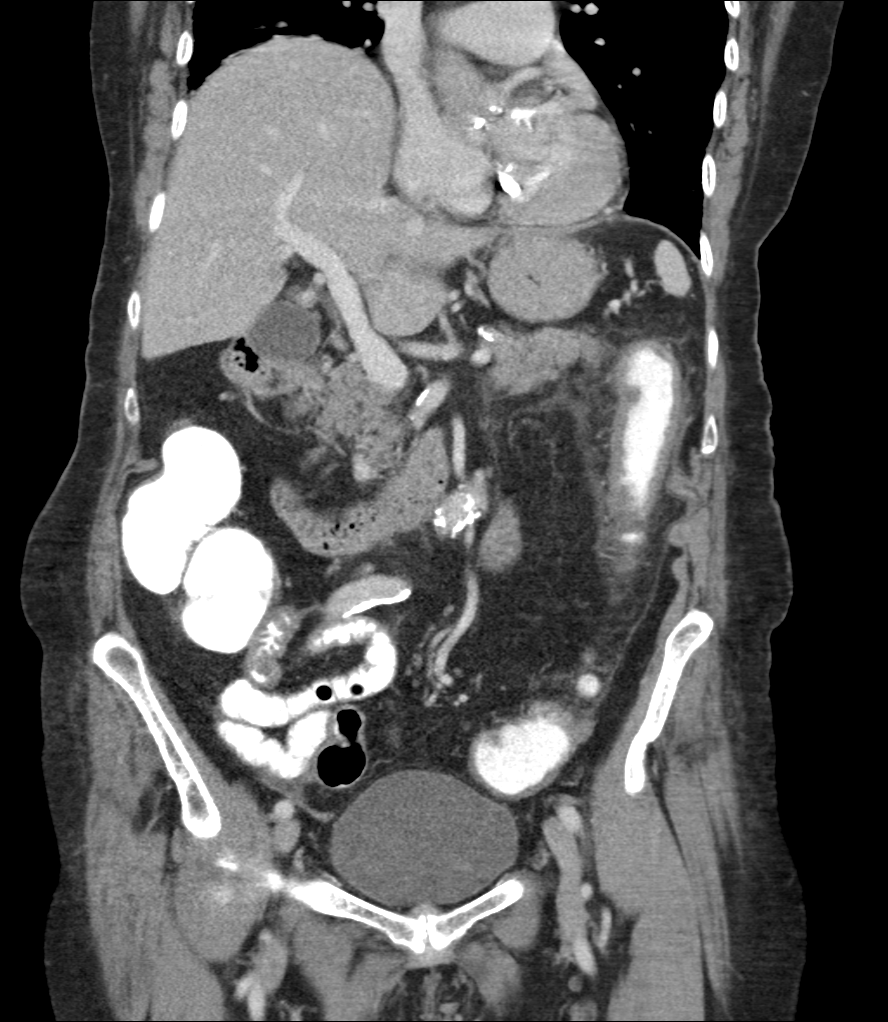
[im 51/92  soft-tissue]
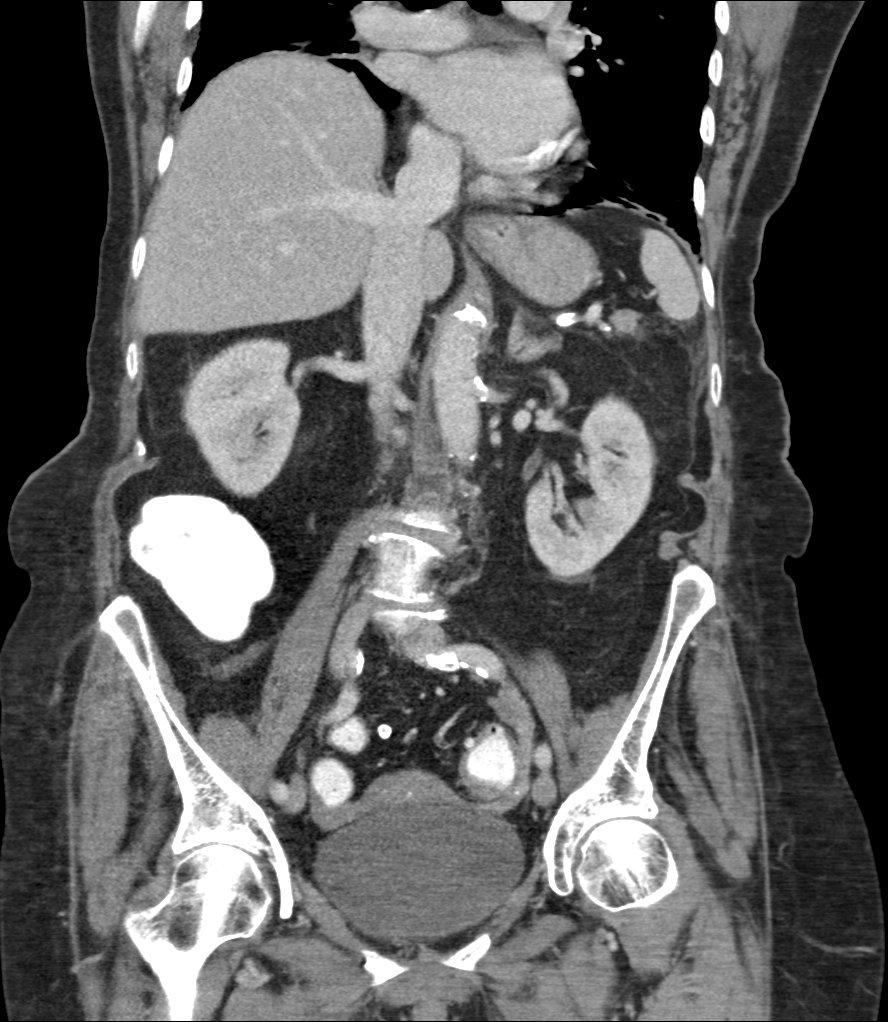

[9 of 46 positions shown; findings below may reference images not displayed]

FINDINGS: Mild bibasilar atelectasis or scarring is noted. Scattered calcified
mediastinal nodes likely reflect remote granulomatous disease. A
tiny hiatal hernia is noted. Diffuse coronary artery calcifications
are seen. There is dense calcification at the aortic and mitral
valves.

There is no evidence of solid or hollow organ injury.

Decreased attenuation at medial hepatic dome is thought to be
artifactual in nature. No definite hepatic laceration is seen. The
spleen is diminutive, with a single calcified granuloma noted. There
is suggestion of a small stone at the base of the gallbladder. The
gallbladder is otherwise unremarkable. The pancreas and adrenal
glands are unremarkable.

Mild nonspecific perinephric stranding is noted bilaterally. The
kidneys are otherwise unremarkable. There is no evidence of
hydronephrosis. No renal or ureteral stones are seen.

Mucosal edema and wall thickening is noted along the transverse,
descending and proximal sigmoid colon, compatible with colitis.
Associated soft tissue inflammation and trace free fluid is seen.
There is no evidence of perforation or abscess formation at this
time.

Contrast progresses to the level of the rectum. The sigmoid colon is
somewhat redundant. Scattered diverticulosis is noted along the
distal descending and proximal sigmoid colon.

The small bowel is unremarkable in appearance. The stomach is within
normal limits. No acute vascular abnormalities are seen.

There is borderline aneurysmal dilatation of the infrarenal
abdominal aorta, measuring 2.6 cm in AP dimension and 3.1 cm in
transverse dimension. Diffuse calcification is seen along the
abdominal aorta and its branches, including at the origins of the
renal arteries bilaterally. Aneurysmal dilatation resolves proximal
to the level of the aortic bifurcation.

The appendix is not definitely seen. There is no evidence of
appendicitis.

The bladder is moderately distended and grossly unremarkable. The
uterus is within normal limits. The ovaries are grossly symmetric.
No suspicious adnexal masses are seen. A vaginal pessary is noted. A
few calcified epiploic appendages are seen at the upper pelvis. No
inguinal lymphadenopathy is seen.

There is an acute compression fracture involving the superior
endplate of L1, with approximately 30% loss of height. There is
approximately 5 mm of retropulsion. There is no evidence of
extension to the posterior elements. There is mild chronic
compression deformity involving the inferior endplate of L2.
Endplate sclerotic change is noted at the lower lumbar spine, with
associated disc space narrowing. Vacuum phenomenon is noted at L2-L3
and L3-L4.
IMPRESSION: 1. Acute colitis involving the transverse, descending and proximal
sigmoid colon, with mucosal edema and wall thickening, and
surrounding soft tissue inflammation and trace fluid. No evidence of
perforation or abscess formation at this time. This may be
infectious or inflammatory in nature; there is no definite evidence
of ischemia.
2. Acute compression fracture involving the superior endplate of L1,
with approximately 30% loss of height. There is approximately 5 mm
of retropulsion. No evidence of extension to the posterior elements.
3. Borderline aneurysmal dilatation of the infrarenal abdominal
aorta, measuring 2.6 cm in AP dimension and 3.1 cm in transverse
dimension. Diffuse calcification along the abdominal aorta and its
branches, including at the origins of the renal arteries. Recommend
followup by ultrasound in 3 years. This recommendation follows ACR
consensus guidelines: White Paper of the ACR Incidental Findings
4. Diffuse coronary artery calcifications noted. Dense calcification
at the aortic and mitral valves.
5. Cholelithiasis; gallbladder otherwise unremarkable.
6. Scattered diverticulosis along the distal descending and proximal
sigmoid colon, without evidence of diverticulitis.
7. Mild bibasilar atelectasis or scarring noted. Scattered calcified
mediastinal nodes likely reflect remote granulomatous disease.
8. Mild degenerative change noted along the lower lumbar spine.

## 2015-07-21 ENCOUNTER — Ambulatory Visit (INDEPENDENT_AMBULATORY_CARE_PROVIDER_SITE_OTHER): Payer: Medicare Other | Admitting: Podiatry

## 2015-07-21 ENCOUNTER — Encounter: Payer: Self-pay | Admitting: Podiatry

## 2015-07-21 DIAGNOSIS — M79673 Pain in unspecified foot: Secondary | ICD-10-CM | POA: Diagnosis not present

## 2015-07-21 DIAGNOSIS — B351 Tinea unguium: Secondary | ICD-10-CM | POA: Diagnosis not present

## 2015-07-21 NOTE — Addendum Note (Signed)
Addended by: Ronald Lobo on: 07/21/2015 10:42 AM   Modules accepted: Orders, Medications

## 2015-07-21 NOTE — Progress Notes (Signed)
Patient ID: Whitney Marquez, female   DOB: 06/01/1923, 81 y.o.   MRN: [id number] Complaint:  Visit Type: Patient returns to my office for continued preventative foot care services. Complaint: Patient states" my nails have grown long and thick and become painful to walk and wear shoes.. She presents for preventative foot care services. No changes to ROS.  Painful callus under big toe joint right foot.  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  Podiatric Exam: Vascular: dorsalis pedis and posterior tibial pulses are palpable bilateral. Capillary return is immediate. Temperature gradient is WNL. Skin turgor WNL  Sensorium: Normal Semmes Weinstein monofilament test. Normal tactile sensation bilaterally. Nail Exam: Pt has thick disfigured discolored nails with subungual debris noted bilateral entire nail hallux through fifth toenails Ulcer Exam: There is no evidence of ulcer or pre-ulcerative changes or infection. Orthopedic Exam: Muscle tone and strength are WNL. No limitations in general ROM. No crepitus or effusions noted. Foot type and digits show no abnormalities. Bony prominences are unremarkable. Skin: No Porokeratosis. No infection or ulcers.  Asymptomatic callus right foot  Diagnosis:  Tinea unguium, Pain in right toe, pain in left toes  Treatment & Plan Procedures and Treatment: Consent by patient was obtained for treatment procedures. The patient understood the discussion of treatment and procedures well. All questions were answered thoroughly reviewed. Debridement of mycotic and hypertrophic toenails, 1 through 5 bilateral and clearing of subungual debris. No ulceration, no infection noted.  Return Visit-Office Procedure: Patient instructed to return to the office for a follow up visit 3 months for continued evaluation and treatment.

## 2015-07-21 NOTE — Addendum Note (Signed)
Addended by: Ronald Lobo on: 07/21/2015 10:45 AM   Modules accepted: Orders, Medications

## 2015-09-29 ENCOUNTER — Encounter: Payer: Self-pay | Admitting: Podiatry

## 2015-09-29 ENCOUNTER — Ambulatory Visit (INDEPENDENT_AMBULATORY_CARE_PROVIDER_SITE_OTHER): Payer: Medicare Other | Admitting: Podiatry

## 2015-09-29 DIAGNOSIS — B351 Tinea unguium: Secondary | ICD-10-CM

## 2015-09-29 DIAGNOSIS — M79673 Pain in unspecified foot: Secondary | ICD-10-CM

## 2015-09-29 NOTE — Progress Notes (Signed)
Patient ID: Whitney Marquez, female   DOB: 14-Nov-1922, 81 y.o.   MRN: [id number] Complaint:  Visit Type: Patient returns to my office for continued preventative foot care services. Complaint: Patient states" my nails have grown long and thick and become painful to walk and wear shoes.. She presents for preventative foot care services. No changes to ROS.  Painful callus under big toe joint right foot.  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  Podiatric Exam: Vascular: dorsalis pedis and posterior tibial pulses are palpable bilateral. Capillary return is immediate. Temperature gradient is WNL. Skin turgor WNL  Sensorium: Normal Semmes Weinstein monofilament test. Normal tactile sensation bilaterally. Nail Exam: Pt has thick disfigured discolored nails with subungual debris noted bilateral entire nail hallux through fifth toenails Ulcer Exam: There is no evidence of ulcer or pre-ulcerative changes or infection. Orthopedic Exam: Muscle tone and strength are WNL. No limitations in general ROM. No crepitus or effusions noted. Foot type and digits show no abnormalities. Bony prominences are unremarkable. Skin: No Porokeratosis. No infection or ulcers.  Asymptomatic callus right foot  Diagnosis:  Tinea unguium, Pain in right toe, pain in left toes  Treatment & Plan Procedures and Treatment: Consent by patient was obtained for treatment procedures. The patient understood the discussion of treatment and procedures well. All questions were answered thoroughly reviewed. Debridement of mycotic and hypertrophic toenails, 1 through 5 bilateral and clearing of subungual debris. No ulceration, no infection noted.  Return Visit-Office Procedure: Patient instructed to return to the office for a follow up visit 10 weeks  for continued evaluation and treatment.   Gardiner Barefoot DPM

## 2015-10-20 ENCOUNTER — Other Ambulatory Visit: Payer: Self-pay | Admitting: Nurse Practitioner

## 2015-10-20 ENCOUNTER — Ambulatory Visit
Admission: RE | Admit: 2015-10-20 | Discharge: 2015-10-20 | Disposition: A | Discharge status: Home / Self Care | Payer: Medicare Other | Source: Ambulatory Visit | Attending: Nurse Practitioner | Admitting: Nurse Practitioner

## 2015-10-20 DIAGNOSIS — R079 Chest pain, unspecified: Secondary | ICD-10-CM

## 2015-12-08 ENCOUNTER — Ambulatory Visit (INDEPENDENT_AMBULATORY_CARE_PROVIDER_SITE_OTHER): Payer: Medicare Other | Admitting: Podiatry

## 2015-12-08 ENCOUNTER — Encounter: Payer: Self-pay | Admitting: Podiatry

## 2015-12-08 DIAGNOSIS — M79673 Pain in unspecified foot: Secondary | ICD-10-CM

## 2015-12-08 DIAGNOSIS — B351 Tinea unguium: Secondary | ICD-10-CM | POA: Diagnosis not present

## 2015-12-08 NOTE — Progress Notes (Signed)
Patient ID: Whitney Marquez, female   DOB: 09-28-1923, 82 y.o.   MRN: [id number] Complaint:  Visit Type: Patient returns to my office for continued preventative foot care services. Complaint: Patient states" my nails have grown long and thick and become painful to walk and wear shoes.. She presents for preventative foot care services. No changes to ROS.  Painful callus under big toe joint right foot.  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  Podiatric Exam: Vascular: dorsalis pedis and posterior tibial pulses are palpable bilateral. Capillary return is immediate. Temperature gradient is WNL. Skin turgor WNL  Sensorium: Normal Semmes Weinstein monofilament test. Normal tactile sensation bilaterally. Nail Exam: Pt has thick disfigured discolored nails with subungual debris noted bilateral entire nail hallux through fifth toenails Ulcer Exam: There is no evidence of ulcer or pre-ulcerative changes or infection. Orthopedic Exam: Muscle tone and strength are WNL. No limitations in general ROM. No crepitus or effusions noted. Foot type and digits show no abnormalities. Bony prominences are unremarkable. Skin: No Porokeratosis. No infection or ulcers.  Asymptomatic callus right foot  Diagnosis:  Tinea unguium, Pain in right toe, pain in left toes  Treatment & Plan Procedures and Treatment: Consent by patient was obtained for treatment procedures. The patient understood the discussion of treatment and procedures well. All questions were answered thoroughly reviewed. Debridement of mycotic and hypertrophic toenails, 1 through 5 bilateral and clearing of subungual debris. No ulceration, no infection noted.  Return Visit-Office Procedure: Patient instructed to return to the office for a follow up visit 10 weeks  for continued evaluation and treatment.   Gardiner Barefoot DPM

## 2016-02-17 ENCOUNTER — Ambulatory Visit (INDEPENDENT_AMBULATORY_CARE_PROVIDER_SITE_OTHER): Payer: Medicare Other | Admitting: Podiatry

## 2016-02-17 ENCOUNTER — Encounter: Payer: Self-pay | Admitting: Podiatry

## 2016-02-17 DIAGNOSIS — M79673 Pain in unspecified foot: Secondary | ICD-10-CM

## 2016-02-17 DIAGNOSIS — B351 Tinea unguium: Secondary | ICD-10-CM | POA: Diagnosis not present

## 2016-02-17 NOTE — Progress Notes (Signed)
Patient ID: Whitney Marquez, female   DOB: 10-04-1922, 82 y.o.   MRN: [id number] Complaint:  Visit Type: Patient returns to my office for continued preventative foot care services. Complaint: Patient states" my nails have grown long and thick and become painful to walk and wear shoes.. She presents for preventative foot care services. No changes to ROS.  Painful callus under big toe joint right foot.  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  Podiatric Exam: Vascular: dorsalis pedis and posterior tibial pulses are palpable bilateral. Capillary return is immediate. Temperature gradient is WNL. Skin turgor WNL  Sensorium: Normal Semmes Weinstein monofilament test. Normal tactile sensation bilaterally. Nail Exam: Pt has thick disfigured discolored nails with subungual debris noted bilateral entire nail hallux through fifth toenails Ulcer Exam: There is no evidence of ulcer or pre-ulcerative changes or infection. Orthopedic Exam: Muscle tone and strength are WNL. No limitations in general ROM. No crepitus or effusions noted. Foot type and digits show no abnormalities. Bony prominences are unremarkable. Skin: No Porokeratosis. No infection or ulcers.  Asymptomatic callus right foot  Diagnosis:  Tinea unguium, Pain in right toe, pain in left toes  Treatment & Plan Procedures and Treatment: Consent by patient was obtained for treatment procedures. The patient understood the discussion of treatment and procedures well. All questions were answered thoroughly reviewed. Debridement of mycotic and hypertrophic toenails, 1 through 5 bilateral and clearing of subungual debris. No ulceration, no infection noted.  Return Visit-Office Procedure: Patient instructed to return to the office for a follow up visit 3 months   for continued evaluation and treatment.   Gardiner Barefoot DPM

## 2016-05-10 ENCOUNTER — Ambulatory Visit (INDEPENDENT_AMBULATORY_CARE_PROVIDER_SITE_OTHER): Payer: Medicare Other | Admitting: Podiatry

## 2016-05-10 DIAGNOSIS — M79673 Pain in unspecified foot: Secondary | ICD-10-CM

## 2016-05-10 DIAGNOSIS — B351 Tinea unguium: Secondary | ICD-10-CM | POA: Diagnosis not present

## 2016-05-10 NOTE — Progress Notes (Signed)
Patient ID: Whitney Marquez, female   DOB: 1923/01/31, 82 y.o.   MRN: [id number] Complaint:  Visit Type: Patient returns to my office for continued preventative foot care services. Complaint: Patient states" my nails have grown long and thick and become painful to walk and wear shoes.. She presents for preventative foot care services. No changes to ROS.  Painful callus under big toe joint right foot.  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  Podiatric Exam: Vascular: dorsalis pedis and posterior tibial pulses are palpable bilateral. Capillary return is immediate. Temperature gradient is WNL. Skin turgor WNL  Sensorium: Normal Semmes Weinstein monofilament test. Normal tactile sensation bilaterally. Nail Exam: Pt has thick disfigured discolored nails with subungual debris noted bilateral entire nail hallux through fifth toenails Ulcer Exam: There is no evidence of ulcer or pre-ulcerative changes or infection. Orthopedic Exam: Muscle tone and strength are WNL. No limitations in general ROM. No crepitus or effusions noted. Foot type and digits show no abnormalities. Bony prominences are unremarkable. Skin: No Porokeratosis. No infection or ulcers.  Asymptomatic callus right foot  Diagnosis:  Tinea unguium, Pain in right toe, pain in left toes  Treatment & Plan Procedures and Treatment: Consent by patient was obtained for treatment procedures. The patient understood the discussion of treatment and procedures well. All questions were answered thoroughly reviewed. Debridement of mycotic and hypertrophic toenails, 1 through 5 bilateral and clearing of subungual debris. No ulceration, no infection noted.  Return Visit-Office Procedure: Patient instructed to return to the office for a follow up visit 3 months   for continued evaluation and treatment.   Gardiner Barefoot DPM

## 2016-05-17 ENCOUNTER — Ambulatory Visit
Admission: RE | Admit: 2016-05-17 | Discharge: 2016-05-17 | Disposition: A | Discharge status: Home / Self Care | Payer: Medicare Other | Source: Ambulatory Visit | Attending: Geriatric Medicine | Admitting: Geriatric Medicine

## 2016-05-17 ENCOUNTER — Other Ambulatory Visit: Payer: Self-pay | Admitting: Geriatric Medicine

## 2016-05-17 DIAGNOSIS — R0789 Other chest pain: Secondary | ICD-10-CM

## 2016-06-24 ENCOUNTER — Observation Stay (HOSPITAL_COMMUNITY)
Admission: EM | Admit: 2016-06-24 | Discharge: 2016-06-26 | Disposition: A | Discharge status: Home / Self Care | Payer: Medicare Other | Attending: Family Medicine | Admitting: Family Medicine

## 2016-06-24 ENCOUNTER — Emergency Department (HOSPITAL_COMMUNITY): Payer: Medicare Other

## 2016-06-24 ENCOUNTER — Encounter (HOSPITAL_COMMUNITY): Payer: Self-pay

## 2016-06-24 DIAGNOSIS — Z89429 Acquired absence of other toe(s), unspecified side: Secondary | ICD-10-CM | POA: Insufficient documentation

## 2016-06-24 DIAGNOSIS — I083 Combined rheumatic disorders of mitral, aortic and tricuspid valves: Secondary | ICD-10-CM | POA: Insufficient documentation

## 2016-06-24 DIAGNOSIS — I6523 Occlusion and stenosis of bilateral carotid arteries: Secondary | ICD-10-CM | POA: Diagnosis not present

## 2016-06-24 DIAGNOSIS — Z888 Allergy status to other drugs, medicaments and biological substances status: Secondary | ICD-10-CM | POA: Insufficient documentation

## 2016-06-24 DIAGNOSIS — M1990 Unspecified osteoarthritis, unspecified site: Secondary | ICD-10-CM | POA: Diagnosis not present

## 2016-06-24 DIAGNOSIS — M419 Scoliosis, unspecified: Secondary | ICD-10-CM | POA: Diagnosis not present

## 2016-06-24 DIAGNOSIS — Z91013 Allergy to seafood: Secondary | ICD-10-CM | POA: Insufficient documentation

## 2016-06-24 DIAGNOSIS — Z881 Allergy status to other antibiotic agents status: Secondary | ICD-10-CM | POA: Diagnosis not present

## 2016-06-24 DIAGNOSIS — M5031 Other cervical disc degeneration,  high cervical region: Secondary | ICD-10-CM | POA: Diagnosis not present

## 2016-06-24 DIAGNOSIS — Z7982 Long term (current) use of aspirin: Secondary | ICD-10-CM | POA: Insufficient documentation

## 2016-06-24 DIAGNOSIS — E785 Hyperlipidemia, unspecified: Secondary | ICD-10-CM | POA: Diagnosis present

## 2016-06-24 DIAGNOSIS — Z880 Allergy status to penicillin: Secondary | ICD-10-CM | POA: Insufficient documentation

## 2016-06-24 DIAGNOSIS — I639 Cerebral infarction, unspecified: Principal | ICD-10-CM | POA: Insufficient documentation

## 2016-06-24 DIAGNOSIS — Z85038 Personal history of other malignant neoplasm of large intestine: Secondary | ICD-10-CM | POA: Insufficient documentation

## 2016-06-24 DIAGNOSIS — N390 Urinary tract infection, site not specified: Secondary | ICD-10-CM | POA: Diagnosis not present

## 2016-06-24 DIAGNOSIS — I10 Essential (primary) hypertension: Secondary | ICD-10-CM | POA: Insufficient documentation

## 2016-06-24 DIAGNOSIS — Z66 Do not resuscitate: Secondary | ICD-10-CM | POA: Insufficient documentation

## 2016-06-24 DIAGNOSIS — Z853 Personal history of malignant neoplasm of breast: Secondary | ICD-10-CM | POA: Diagnosis not present

## 2016-06-24 DIAGNOSIS — R410 Disorientation, unspecified: Secondary | ICD-10-CM | POA: Diagnosis present

## 2016-06-24 DIAGNOSIS — Z91041 Radiographic dye allergy status: Secondary | ICD-10-CM | POA: Insufficient documentation

## 2016-06-24 DIAGNOSIS — Z8673 Personal history of transient ischemic attack (TIA), and cerebral infarction without residual deficits: Secondary | ICD-10-CM | POA: Diagnosis not present

## 2016-06-24 DIAGNOSIS — M6281 Muscle weakness (generalized): Secondary | ICD-10-CM

## 2016-06-24 HISTORY — DX: Unspecified osteoarthritis, unspecified site: M19.90

## 2016-06-24 LAB — COMPREHENSIVE METABOLIC PANEL
ALT: 14 U/L (ref 14–54)
ANION GAP: 12 (ref 5–15)
AST: 25 U/L (ref 15–41)
Albumin: 3.5 g/dL (ref 3.5–5.0)
Alkaline Phosphatase: 38 U/L (ref 38–126)
BUN: 16 mg/dL (ref 6–20)
CHLORIDE: 101 mmol/L (ref 101–111)
CO2: 22 mmol/L (ref 22–32)
Calcium: 9.1 mg/dL (ref 8.9–10.3)
Creatinine, Ser: 0.5 mg/dL (ref 0.44–1.00)
Glucose, Bld: 111 mg/dL — ABNORMAL HIGH (ref 65–99)
Potassium: 4.1 mmol/L (ref 3.5–5.1)
SODIUM: 135 mmol/L (ref 135–145)
Total Bilirubin: 0.4 mg/dL (ref 0.3–1.2)
Total Protein: 6 g/dL — ABNORMAL LOW (ref 6.5–8.1)

## 2016-06-24 LAB — CBC
HCT: 36.8 % (ref 36.0–46.0)
Hemoglobin: 11.8 g/dL — ABNORMAL LOW (ref 12.0–15.0)
MCH: 28.7 pg (ref 26.0–34.0)
MCHC: 32.1 g/dL (ref 30.0–36.0)
MCV: 89.5 fL (ref 78.0–100.0)
PLATELETS: 316 10*3/uL (ref 150–400)
RBC: 4.11 MIL/uL (ref 3.87–5.11)
RDW: 14.3 % (ref 11.5–15.5)
WBC: 14 10*3/uL — AB (ref 4.0–10.5)

## 2016-06-24 LAB — I-STAT CHEM 8, ED
BUN: 18 mg/dL (ref 6–20)
CALCIUM ION: 1.17 mmol/L (ref 1.15–1.40)
CHLORIDE: 100 mmol/L — AB (ref 101–111)
Creatinine, Ser: 0.5 mg/dL (ref 0.44–1.00)
Glucose, Bld: 110 mg/dL — ABNORMAL HIGH (ref 65–99)
HCT: 39 % (ref 36.0–46.0)
HEMOGLOBIN: 13.3 g/dL (ref 12.0–15.0)
POTASSIUM: 3.9 mmol/L (ref 3.5–5.1)
SODIUM: 136 mmol/L (ref 135–145)
TCO2: 24 mmol/L (ref 0–100)

## 2016-06-24 LAB — DIFFERENTIAL
BASOS PCT: 0 %
Basophils Absolute: 0 10*3/uL (ref 0.0–0.1)
EOS ABS: 0.1 10*3/uL (ref 0.0–0.7)
EOS PCT: 0 %
Lymphocytes Relative: 11 %
Lymphs Abs: 1.6 10*3/uL (ref 0.7–4.0)
MONO ABS: 2.1 10*3/uL — AB (ref 0.1–1.0)
Monocytes Relative: 15 %
NEUTROS ABS: 10.2 10*3/uL — AB (ref 1.7–7.7)
Neutrophils Relative %: 74 %

## 2016-06-24 LAB — URINE MICROSCOPIC-ADD ON

## 2016-06-24 LAB — URINALYSIS, ROUTINE W REFLEX MICROSCOPIC
BILIRUBIN URINE: NEGATIVE
GLUCOSE, UA: NEGATIVE mg/dL
HGB URINE DIPSTICK: NEGATIVE
Ketones, ur: NEGATIVE mg/dL
Nitrite: POSITIVE — AB
Protein, ur: NEGATIVE mg/dL
SPECIFIC GRAVITY, URINE: 1.009 (ref 1.005–1.030)
pH: 7.5 (ref 5.0–8.0)

## 2016-06-24 LAB — PROTIME-INR
INR: 1.01
PROTHROMBIN TIME: 13.3 s (ref 11.4–15.2)

## 2016-06-24 LAB — I-STAT TROPONIN, ED: TROPONIN I, POC: 0 ng/mL (ref 0.00–0.08)

## 2016-06-24 LAB — APTT: APTT: 26 s (ref 24–36)

## 2016-06-24 MED ORDER — PANTOPRAZOLE SODIUM 40 MG PO TBEC
40.0000 mg | DELAYED_RELEASE_TABLET | Freq: Every day | ORAL | Status: DC
  Administered 2016-06-24 – 2016-06-26 (×3): 40 mg via ORAL
  Filled 2016-06-24 (×3): qty 1

## 2016-06-24 MED ORDER — ENOXAPARIN SODIUM 40 MG/0.4ML ~~LOC~~ SOLN
40.0000 mg | SUBCUTANEOUS | Status: DC
  Administered 2016-06-24 – 2016-06-25 (×2): 40 mg via SUBCUTANEOUS
  Filled 2016-06-24 (×2): qty 0.4

## 2016-06-24 MED ORDER — ASPIRIN 325 MG PO TABS
325.0000 mg | ORAL_TABLET | Freq: Every day | ORAL | Status: DC
  Administered 2016-06-24 – 2016-06-26 (×3): 325 mg via ORAL
  Filled 2016-06-24 (×3): qty 1

## 2016-06-24 MED ORDER — SULFAMETHOXAZOLE-TRIMETHOPRIM 800-160 MG PO TABS
1.0000 | ORAL_TABLET | Freq: Two times a day (BID) | ORAL | Status: DC
  Administered 2016-06-24 – 2016-06-26 (×5): 1 via ORAL
  Filled 2016-06-24 (×5): qty 1

## 2016-06-24 MED ORDER — CALCIUM-VITAMIN D-VITAMIN K 500-100-40 MG-UNT-MCG PO CHEW: 1.0000 | CHEWABLE_TABLET | Freq: Three times a day (TID) | ORAL | Status: DC

## 2016-06-24 MED ORDER — CALCIUM CARBONATE-VITAMIN D 500-200 MG-UNIT PO TABS
1.0000 | ORAL_TABLET | Freq: Three times a day (TID) | ORAL | Status: DC
  Administered 2016-06-25 – 2016-06-26 (×5): 1 via ORAL
  Filled 2016-06-24 (×5): qty 1

## 2016-06-24 MED ORDER — FENOFIBRATE 54 MG PO TABS
54.0000 mg | ORAL_TABLET | Freq: Every day | ORAL | Status: DC
  Administered 2016-06-24 – 2016-06-26 (×3): 54 mg via ORAL
  Filled 2016-06-24 (×3): qty 1

## 2016-06-24 MED ORDER — ASPIRIN 300 MG RE SUPP
300.0000 mg | Freq: Every day | RECTAL | Status: DC
Start: 2016-06-24 — End: 2016-06-26

## 2016-06-24 MED ORDER — STROKE: EARLY STAGES OF RECOVERY BOOK
Freq: Once | Status: AC
  Administered 2016-06-24: 1

## 2016-06-24 MED ORDER — HYDROCODONE-ACETAMINOPHEN 5-325 MG PO TABS
1.0000 | ORAL_TABLET | Freq: Four times a day (QID) | ORAL | Status: DC | PRN
Start: 2016-06-24 — End: 2016-06-26
  Administered 2016-06-25: 1 via ORAL
  Filled 2016-06-24: qty 1

## 2016-06-24 NOTE — ED Provider Notes (Signed)
[facility] DEPT Provider Note   CSN: [id number] Arrival date & time: 06/24/16  1410     History   Chief Complaint Chief Complaint  Patient presents with  . Altered Mental Status    HPI Whitney Marquez is a 82 y.o. female.   Altered Mental Status   This is a new problem. The current episode started 12 to 24 hours ago. The problem has been gradually improving. Associated symptoms include confusion and weakness (left leg drift, right facial droop). Pertinent negatives include no seizures.    Past Medical History:  Diagnosis Date  . Aortic valve stenosis   . Arthritis   . Cancer [facility])    breast and colon  . Cancer ([place])   . Cataract   . Cataracts, both eyes   . Hypertension   . Plantar keratosis   . Scoliosis     Patient Active Problem List   Diagnosis Date Noted  . SIRS (systemic inflammatory response syndrome) ([place]) 10/20/2014  . Acute ischemic colitis ([place]) 10/19/2014  . Compression fracture of L1 lumbar vertebra with routine healing 10/19/2014  . Hypokalemia   . Anemia 10/15/2014  . Essential hypertension 10/15/2014  . Hyperlipidemia 10/15/2014    Past Surgical History:  Procedure Laterality Date  . BACK SURGERY    . BREAST SURGERY     breast cancer  . COLON SURGERY     colon cancer  . COLON SURGERY    . LUMBAR LAMINECTOMY    . meniscus tear    . TOE AMPUTATION     second toe    OB History    Gravida Para Term Preterm AB Living   0 0 0 0 0     SAB TAB Ectopic Multiple Live Births   0 0 0           Home Medications    Prior to Admission medications   Medication Sig Start Date End Date Taking? Authorizing Provider  alendronate (FOSAMAX) 70 MG tablet Take 70 mg by mouth once a week. Take with a full glass of water on an empty stomach.   Yes Historical Provider, MD  amLODipine (NORVASC) 5 MG tablet Take 5 mg by mouth daily.   Yes Historical Provider, MD  aspirin 81 MG tablet Take 81 mg by mouth daily.   Yes Historical Provider, MD    Calcium-Vitamin D-Vitamin K 500-100-40 MG-UNT-MCG CHEW Chew 1 tablet by mouth 3 (three) times daily with meals.    Yes Historical Provider, MD  fenofibrate 54 MG tablet Take 54 mg by mouth daily.   Yes Historical Provider, MD  HYDROcodone-acetaminophen (NORCO/VICODIN) 5-325 MG per tablet Take 1 tablet by mouth every 6 (six) hours as needed for moderate pain.   Yes Suella Broad, MD  Multiple Vitamins-Minerals (CENTRUM SILVER PO) Take 1 tablet by mouth daily.   Yes Historical Provider, MD  naproxen sodium (ALEVE) 220 MG tablet Take 220 mg by mouth at bedtime.   Yes Historical Provider, MD  omeprazole (PRILOSEC) 20 MG capsule Take 20 mg by mouth daily.   Yes Historical Provider, MD  Polyvinyl Alcohol-Povidone (REFRESH OP) Apply 1-2 drops to eye at bedtime as needed (for dry eyes).   Yes Historical Provider, MD    Family History No family history on file.  Social History Social History  Substance Use Topics  . Smoking status: Never Smoker  . Smokeless tobacco: Never Used  . Alcohol use No     Allergies   Cephalosporins; Cephalosporins; Iodine; Macrolides and ketolides;  Other; Penicillins; Pyridium [phenazopyridine hcl]; Macrobid [nitrofurantoin monohyd macro]; Penicillins; and Shellfish allergy   Review of Systems Review of Systems  Constitutional: Negative for chills and fever.  HENT: Negative for ear pain and sore throat.   Eyes: Negative for pain and visual disturbance.  Respiratory: Negative for cough and shortness of breath.   Cardiovascular: Negative for chest pain and palpitations.  Gastrointestinal: Negative for abdominal pain and vomiting.  Genitourinary: Negative for dysuria and hematuria.  Musculoskeletal: Negative for arthralgias and back pain.  Skin: Negative for color change and rash.  Neurological: Positive for weakness (left leg drift, right facial droop). Negative for seizures and syncope.  Psychiatric/Behavioral: Positive for confusion.  All other systems  reviewed and are negative.    Physical Exam Updated Vital Signs BP 143/75 (BP Location: Right Arm)   Pulse 75   Resp 18   SpO2 97%   Physical Exam  Constitutional: She is oriented to person, place, and time. She appears well-developed and well-nourished. No distress.  HENT:  Head: Normocephalic and atraumatic.  Eyes: Conjunctivae are normal.  Neck: Neck supple.  Cardiovascular: Normal rate and regular rhythm.   No murmur heard. Pulmonary/Chest: Effort normal and breath sounds normal. No respiratory distress.  Abdominal: Soft. There is no tenderness.  Musculoskeletal: She exhibits no edema.  Neurological: She is alert and oriented to person, place, and time. A cranial nerve deficit (right facial droop - nasolabial fold softening.) is present. No sensory deficit. She exhibits normal muscle tone. Coordination normal. GCS eye subscore is 4. GCS verbal subscore is 5. GCS motor subscore is 6.  Left leg drift.  Right eye lid lag.  Skin: Skin is warm and dry.  Psychiatric: She has a normal mood and affect.  Nursing note and vitals reviewed.    ED Treatments / Results  Labs (all labs ordered are listed, but only abnormal results are displayed) Labs Reviewed  CBC - Abnormal; Notable for the following:       Result Value   WBC 14.0 (*)    Hemoglobin 11.8 (*)    All other components within normal limits  DIFFERENTIAL - Abnormal; Notable for the following:    Neutro Abs 10.2 (*)    Monocytes Absolute 2.1 (*)    All other components within normal limits  COMPREHENSIVE METABOLIC PANEL - Abnormal; Notable for the following:    Glucose, Bld 111 (*)    Total Protein 6.0 (*)    All other components within normal limits  I-STAT CHEM 8, ED - Abnormal; Notable for the following:    Chloride 100 (*)    Glucose, Bld 110 (*)    All other components within normal limits  PROTIME-INR  APTT  I-STAT TROPOININ, ED  CBG MONITORING, ED    EKG  EKG Interpretation None        Radiology No results found.  Procedures Procedures (including critical care time)  Medications Ordered in ED Medications - No data to display   Initial Impression / Assessment and Plan / ED Course  I have reviewed the triage vital signs and the nursing notes.  Pertinent labs & imaging results that were available during my care of the patient were reviewed by me and considered in my medical decision making (see chart for details).  Clinical Course    Patient presents for confusion with daughter concerned for stroke.  Patient was last normal before bed last night.  Glucose normal.  EKG shows tachycardic rhythm without defined p waves.  This morning,  the patients daughter found the patient trying to make coffee and with all of the cupboard open and inappropriate items on the counter.    Stroke scale assessed.  Likely patient is experiencing acute stroke.  The patient is out of the window for tpa.  Exam concerning for facial droop and leg drift.  Neurology was consulted.  CT Head shows no intracranial abnormalities.  Patient without urinary symptoms. Urine nitrite positive. Will defer to inpatient team for management.  Patient admitted to medicine.  Final Clinical Impressions(s) / ED Diagnoses   Final diagnoses:  Confusion    New Prescriptions New Prescriptions   No medications on file     Elveria Rising, MD 06/24/16 [id number]    Elnora Morrison, MD 06/25/16 [phone]

## 2016-06-24 NOTE — ED Triage Notes (Signed)
Patient here with daughter who is concerned with altered mental status this am. Patient lives at [place] and was normal on phone last night. Today when her daughter arrived patient confused to her her normal daily routine and was unable to make coffee, kitchen was in disarray and patient was ambulating without her walker. No obvious trauma. Patient follows all commands and no obvious neuro deficits.  Patient does not know her birth year, daughter states she normally shouts it out. denies pain. Not seen by anyone when she awakened

## 2016-06-24 NOTE — ED Notes (Signed)
Pt passed swallow screen

## 2016-06-24 NOTE — ED Notes (Signed)
Pts daughter talked to her last night and pt was well.  Dtr went to pts home this morning and kitchen was in disarray, drawers pulled out, four coffee cups on counter, one with honey, one with tea.  Pt told dtr that she felt confused.  A&Ox4, equal grips, left partial hemianopia.  PERRL.  NIH = 1 (on admission to ED NIH= 3 (left leg drift, flattened right nasolabial fold)).   Orders for frequent neuro exams.  .  CT- no acute findings.  Starting 06-25-16 pt will be followed by stroke team.

## 2016-06-24 NOTE — Consult Note (Addendum)
Neurology Consultation Reason for Consult: AMS Referring Physician: Rosalyn Gess  CC: confusion.   History is obtained from:[place]  HPI: Whitney Marquez is a 82 y.o. female who was normal when she went to bed last night. When she awoke this morning, she had difficulty making her coffee, and difficulty logging into her computer. Her daughter notes that this is very atypical for her.   Her daughter also noticed this morning that she was having difficulty with seeing things on her left side.  LKW: 9/22 prior to bed tpa given?: no, out of window    ROS: A 14 point ROS was performed and is negative except as noted in the HPI.  Past Medical History:  Diagnosis Date  . Aortic valve stenosis   . Arthritis   . Cancer [facility])    breast and colon  . Cancer ([place])   . Cataract   . Cataracts, both eyes   . Hypertension   . Plantar keratosis   . Scoliosis      Father-stroke   Social History:  reports that she has never smoked. She has never used smokeless tobacco. She reports that she does not drink alcohol. Her drug history is not on file.   Exam: Current vital signs: BP 135/89 (BP Location: Right Arm)   Pulse 77   Temp 98.1 F (36.7 C)   Resp 21   SpO2 98%  Vital signs in last 24 hours: Temp:  [98.1 F (36.7 C)] 98.1 F (36.7 C) (09/23 1621) Pulse Rate:  [75-77] 77 (09/23 1728) Resp:  [18-21] 21 (09/23 1728) BP: (135-143)/(75-89) 135/89 (09/23 1728) SpO2:  [97 %-98 %] 98 % (09/23 1728)   Physical Exam  Constitutional: Appears well-developed and well-nourished.  Psych: Affect appropriate to situation Eyes: No scleral injection HENT: No OP obstrucion Head: Normocephalic.  Cardiovascular: Normal rate and regular rhythm.  Respiratory: Effort normal and breath sounds normal to anterior ascultation GI: Soft.  No distension. There is no tenderness.  Skin: WDI  Neuro: Mental Status: Patient is awake, alert, oriented to person, place, month, year, and situation. Patient  is able to give a clear and coherent history. No signs of aphasia or neglect Cranial Nerves: II: She has a left hemianopia Pupils are equal, round, and reactive to light.   III,IV, VI: EOMI without ptosis or diploplia.  V: Facial sensation is symmetric to temperature VII: Facial movement is symmetric.  VIII: hearing is intact to voice X: Uvula elevates symmetrically XI: Shoulder shrug is symmetric. XII: tongue is midline without atrophy or fasciculations.  Motor: Tone is normal. Bulk is normal. 5/5 strength was present in all four extremities.  Sensory: Sensation is symmetric to light touch and temperature in the arms and legs. Cerebellar: FNF with mild tremor bilaterally   I have reviewed labs in epic and the results pertinent to this consultation are: CMP-unremarkable  I have reviewed the images obtained: CT head-no definite acute findings  Impression: 82 year old female with new onset left hemianopia and confusion. Certainly the most likely explanation would be a PCA infarct, and she will need to be admitted for evaluation and workup of this. She was taking only a baby aspirin prior to this.  Recommendations: 1. HgbA1c, fasting lipid panel 2. MRI, MRA  of the brain without contrast, MRA neck w/ contrast.  3. Frequent neuro checks 4. Echocardiogram 5. Prophylactic therapy-Antiplatelet med: Aspirin - dose 325mg  PO or 300mg  PR 6. Risk factor modification 7. Telemetry monitoring 8. PT consult, OT consult, Speech consult  9. please page stroke NP  Or  PA  Or MD  from 8am -4 pm starting 9/24 as this patient will be followed by the stroke team at this point.   You can look them up on [email or web address]      Roland Rack, MD [facility] [phone]  If 7pm- 7am, please page neurology on call as listed in [place].

## 2016-06-24 NOTE — H&P (Addendum)
History and Physical    Whitney Marquez [id number] DOB: [birth year]  DOA: 06/24/2016 PCP: Mathews Argyle, MD  Patient coming from: Home  Chief Complaint: Confusion  HPI: Whitney Marquez is a 82 y.o. female with medical history significant of breast and colon cancer on remission, hypertension well controlled, aortic valve stenosis, arthritis on pain medications and receiving physical therapy, and cataracts. Patient presented to the ED after daughter noticed the patient confused this morning. The report that she spoke with the patient last night around 6 PM and patient was completely normal. This morning when daughter arrived to the house patient was confused and disoriented, able to dress her self was slurring her speech, she also noticed difficulty walking with her walker which she normally does without any problem, and difficulty with vision on the left side. Took baby aspirin in the morning. At this point patient denies chest pain, palpitation, dizziness, headaches, shortness of breath and weakness.  ED Course: CT head was done showed no acute process, UA was positive for nitrates and large leukocytes. Per daughter patient have improved, seems close to baseline  Review of Systems  General: no changes in body weight, no fever chills or decrease in energy.  HEENT: + difficulty vision on left, difficulty hearing, no sore throat Respiratory: no dyspnea, coughing, wheezing CV: no chest pain, no palpitations GI: no nausea, vomiting, abdominal pain, diarrhea, constipation GU: no dysuria, burning on urination, increased urinary frequency, hematuria  Ext:. No deformities,  Neuro: + Confusion, and generalized weakness., numbness, or tingling, no vision change or hearing loss Skin: No rashes, lesions or wounds. MSK:  No muscle spasm, no deformity, limitation of range of movement right knee due to pain Heme: No easy bruising.  Travel history: No recent long distant travel.   Past  Medical History:  Diagnosis Date  . Aortic valve stenosis   . Arthritis   . Cancer [facility])    breast and colon  . Cancer ([place])   . Cataract   . Cataracts, both eyes   . Hypertension   . Plantar keratosis   . Scoliosis     Past Surgical History:  Procedure Laterality Date  . BACK SURGERY    . BREAST SURGERY     breast cancer  . COLON SURGERY     colon cancer  . COLON SURGERY    . LUMBAR LAMINECTOMY    . meniscus tear    . TOE AMPUTATION     second toe     reports that she has never smoked. She has never used smokeless tobacco. She reports that she does not drink alcohol. Her drug history is not on file.  Allergies  Allergen Reactions  . Cephalosporins Anaphylaxis  . Cephalosporins   . Iodine   . Macrolides And Ketolides   . Other Swelling    Walnuts causes lips swelling  . Penicillins   . Pyridium [Phenazopyridine Hcl]   . Macrobid [Nitrofurantoin Monohyd Macro] Swelling and Rash  . Penicillins Hives and Rash  . Shellfish Allergy Rash    No family history on file. Family history reviewed and non pertinent  Prior to Admission medications   Medication Sig Start Date End Date Taking? Authorizing Provider  alendronate (FOSAMAX) 70 MG tablet Take 70 mg by mouth once a week. Take with a full glass of water on an empty stomach.   Yes Historical Provider, MD  amLODipine (NORVASC) 5 MG tablet Take 5 mg by mouth daily.   Yes Historical Provider, MD  aspirin 81 MG tablet Take 81 mg by mouth daily.   Yes Historical Provider, MD  Calcium-Vitamin D-Vitamin K 500-100-40 MG-UNT-MCG CHEW Chew 1 tablet by mouth 3 (three) times daily with meals.    Yes Historical Provider, MD  fenofibrate 54 MG tablet Take 54 mg by mouth daily.   Yes Historical Provider, MD  HYDROcodone-acetaminophen (NORCO/VICODIN) 5-325 MG per tablet Take 1 tablet by mouth every 6 (six) hours as needed for moderate pain.   Yes Suella Broad, MD  Multiple Vitamins-Minerals (CENTRUM SILVER PO) Take 1 tablet by mouth  daily.   Yes Historical Provider, MD  naproxen sodium (ALEVE) 220 MG tablet Take 220 mg by mouth at bedtime.   Yes Historical Provider, MD  omeprazole (PRILOSEC) 20 MG capsule Take 20 mg by mouth daily.   Yes Historical Provider, MD  Polyvinyl Alcohol-Povidone (REFRESH OP) Apply 1-2 drops to eye at bedtime as needed (for dry eyes).   Yes Historical Provider, MD    Physical Exam: Vitals:   06/24/16 1556 06/24/16 1621 06/24/16 1728  BP: 143/75  135/89  Pulse: 75  77  Resp: 18  21  Temp:  98.1 F (36.7 C)   SpO2: 97%  98%     Constitutional: NAD, calm, comfortable Eyes: Left hemianopsia, PERRL, lids and conjunctivae normal ENMT: Mucous membranes are moist. Posterior pharynx clear of any exudate or lesions.Normal dentition.  Neck: normal, supple, no masses, no thyromegaly Respiratory: clear to auscultation bilaterally, no wheezing, no crackles. Normal respiratory effort. No accessory muscle use.  Cardiovascular: Regular rate and rhythm, + systolic murmur. No rubs or gallops No extremity edema. 2+ pedal pulses. No carotid bruits.  Abdomen: no tenderness, no masses palpated. No hepatosplenomegaly. Bowel sounds positive.  Musculoskeletal: no clubbing / cyanosis. No joint deformity upper and lower extremities.  Skin: no rashes, lesions, ulcers. No induration Neurologic: CN 2-12 grossly intact. Sensation intact, DTR normal. Strength 5/5 in all 4. NIH 3 Psychiatric: Normal judgment and insight. Alert and oriented x 3. Normal mood.   Labs on Admission: I have personally reviewed following labs and imaging studies  CBC:  Recent Labs Lab 06/24/16 1408 06/24/16 1446  WBC 14.0*  --   NEUTROABS 10.2*  --   HGB 11.8* 13.3  HCT 36.8 39.0  MCV 89.5  --   PLT 316  --    Basic Metabolic Panel:  Recent Labs Lab 06/24/16 1408 06/24/16 1446  NA 135 136  K 4.1 3.9  CL 101 100*  CO2 22  --   GLUCOSE 111* 110*  BUN 16 18  CREATININE 0.50 0.50  CALCIUM 9.1  --    GFR: CrCl cannot be  calculated (Unknown ideal weight.). Liver Function Tests:  Recent Labs Lab 06/24/16 1408  AST 25  ALT 14  ALKPHOS 38  BILITOT 0.4  PROT 6.0*  ALBUMIN 3.5   No results for input(s): LIPASE, AMYLASE in the last 168 hours. No results for input(s): AMMONIA in the last 168 hours. Coagulation Profile:  Recent Labs Lab 06/24/16 1408  INR 1.01   Cardiac Enzymes: No results for input(s): CKTOTAL, CKMB, CKMBINDEX, TROPONINI in the last 168 hours. BNP (last 3 results) No results for input(s): PROBNP in the last 8760 hours. HbA1C: No results for input(s): HGBA1C in the last 72 hours. CBG: No results for input(s): GLUCAP in the last 168 hours. Lipid Profile: No results for input(s): CHOL, HDL, LDLCALC, TRIG, CHOLHDL, LDLDIRECT in the last 72 hours. Thyroid Function Tests: No results for input(s): TSH, T4TOTAL,  FREET4, T3FREE, THYROIDAB in the last 72 hours. Anemia Panel: No results for input(s): VITAMINB12, FOLATE, FERRITIN, TIBC, IRON, RETICCTPCT in the last 72 hours. Urine analysis:    Component Value Date/Time   COLORURINE AMBER (A) 10/15/2014 0125   APPEARANCEUR CLEAR 10/15/2014 0125   LABSPEC 1.023 10/15/2014 0125   PHURINE 5.5 10/15/2014 0125   GLUCOSEU NEGATIVE 10/15/2014 0125   HGBUR NEGATIVE 10/15/2014 0125   BILIRUBINUR SMALL (A) 10/15/2014 0125   KETONESUR 15 (A) 10/15/2014 0125   PROTEINUR NEGATIVE 10/15/2014 0125   UROBILINOGEN 0.2 10/15/2014 0125   NITRITE NEGATIVE 10/15/2014 0125   LEUKOCYTESUR SMALL (A) 10/15/2014 0125   Sepsis Labs: !!!!!!!!!!!!!!!!!!!!!!!!!!!!!!!!!!!!!!!!!!!! @LABRCNTIP (procalcitonin:4,lacticidven:4) )No results found for this or any previous visit (from the past 240 hour(s)).   Radiological Exams on Admission: Ct Head Wo Contrast  Result Date: 06/24/2016 CLINICAL DATA:  Episode of confusion this morning. The symptoms have improved. No headache. EXAM: CT HEAD WITHOUT CONTRAST TECHNIQUE: Contiguous axial images were obtained from the  base of the skull through the vertex without intravenous contrast. COMPARISON:  MRI August 19, 2008 FINDINGS: Brain: No subdural, epidural, or subarachnoid hemorrhage. Cerebellum, brainstem, and basal cisterns are normal. Ventricles and sulci are unremarkable for age. No mass, mass effect, or midline shift. Mild white matter changes. No acute cortical ischemia or infarct. Vascular: Calcified atherosclerosis is seen in the intracranial carotid arteries. Skull: Normal. Negative for fracture or focal lesion. Sinuses/Orbits: No acute finding. Other: Anterior listhesis of C4 versus C5 on the scout view is unchanged when compared to the cervical spine x-ray from September 2014. IMPRESSION: No acute intracranial process. Electronically Signed   By: Dorise Bullion III M.D   On: 06/24/2016 17:25    EKG: Independently reviewed. Accelerated Junctional Rhythm @ 104   Assessment/Plan   Confusion/ rule out CVA - NIH score at the time of my evaluation was 3. Left hemianopia, slight flattening of the nasal fold and mild dysarthria. Confusion could be also because UTI. Initial CT no acute findings, not candidate for TPA - Admit to telemetry - Neurology recommendation appreciated  - MRI/MRA  - Echo and carotid Doppler  - Aspirin 325 mg - Fall precautions and neuro checks  - Lipid panel  - PT, OT and speech consult   UTI - positive nitrate and large leukocytes - Start Bactrim  - follow urine cultures   Hypertension - stable We'll hold home hypertensive medication if MRI negative can resume amlodipine  Arthritis Patient management as needed  Hyperlipidemia Continue home medications  DVT prophylaxis:  Lovenox  Code Status: DNR Family Communication:  Labs and plan discussed with daughter at bedside Disposition  Plan: Anticipate discharge to previous home environment.  Consults called: Neurology Admission  status: Inpatient    Chipper Oman MD [facility] Pager [phone]  If 7PM-7AM,  please contact night-coverage [email or web address] Password [facility]  06/24/2016, 6:21 PM

## 2016-06-25 ENCOUNTER — Inpatient Hospital Stay (HOSPITAL_COMMUNITY): Payer: Medicare Other

## 2016-06-25 DIAGNOSIS — N390 Urinary tract infection, site not specified: Secondary | ICD-10-CM

## 2016-06-25 DIAGNOSIS — I10 Essential (primary) hypertension: Secondary | ICD-10-CM | POA: Diagnosis not present

## 2016-06-25 DIAGNOSIS — I639 Cerebral infarction, unspecified: Secondary | ICD-10-CM | POA: Diagnosis not present

## 2016-06-25 DIAGNOSIS — E785 Hyperlipidemia, unspecified: Secondary | ICD-10-CM | POA: Diagnosis not present

## 2016-06-25 DIAGNOSIS — G9340 Encephalopathy, unspecified: Secondary | ICD-10-CM | POA: Diagnosis not present

## 2016-06-25 LAB — LIPID PANEL
CHOLESTEROL: 124 mg/dL (ref 0–200)
HDL: 76 mg/dL (ref >40)
LDL Cholesterol: 33 mg/dL (ref 0–99)
TRIGLYCERIDES: 75 mg/dL (ref <150)
Total CHOL/HDL Ratio: 1.6 RATIO
VLDL: 15 mg/dL (ref 0–40)

## 2016-06-25 LAB — URINE CULTURE: Culture: NO GROWTH

## 2016-06-25 MED ORDER — GADOBENATE DIMEGLUMINE 529 MG/ML IV SOLN
15.0000 mL | Freq: Once | INTRAVENOUS | Status: AC | PRN
  Administered 2016-06-25: 15 mL via INTRAVENOUS

## 2016-06-25 NOTE — Progress Notes (Signed)
STROKE TEAM PROGRESS NOTE   HISTORY OF PRESENT ILLNESS (per record) Whitney Marquez is a 82 y.o. female who was normal when she went to bed last night. When she awoke this morning, she had difficulty making her coffee, and difficulty logging into her computer. Her daughter notes that this is very atypical for her. Her daughter also noticed this morning that she was having difficulty with seeing things on her left side.  LKW: 9/22 prior to bed tpa given?: no, out of window   SUBJECTIVE (INTERVAL HISTORY) Patient is alert and oriented denies any weakness   OBJECTIVE Temp:  [97.4 F (36.3 C)-98.4 F (36.9 C)] 98.1 F (36.7 C) (09/24 0931) Pulse Rate:  [72-92] 85 (09/24 0931) Cardiac Rhythm: Heart block (09/24 0741) Resp:  [18-21] 18 (09/24 0931) BP: (101-151)/(56-89) 132/71 (09/24 0931) SpO2:  [91 %-98 %] 96 % (09/24 0931) Weight:  [54.1 kg (119 lb 4.3 oz)] 54.1 kg (119 lb 4.3 oz) (09/23 2000)  CBC:   Recent Labs Lab 06/24/16 1408 06/24/16 1446  WBC 14.0*  --   NEUTROABS 10.2*  --   HGB 11.8* 13.3  HCT 36.8 39.0  MCV 89.5  --   PLT 316  --     Basic Metabolic Panel:   Recent Labs Lab 06/24/16 1408 06/24/16 1446  NA 135 136  K 4.1 3.9  CL 101 100*  CO2 22  --   GLUCOSE 111* 110*  BUN 16 18  CREATININE 0.50 0.50  CALCIUM 9.1  --     Lipid Panel:     Component Value Date/Time   CHOL 124 06/25/2016 0403   TRIG 75 06/25/2016 0403   HDL 76 06/25/2016 0403   CHOLHDL 1.6 06/25/2016 0403   VLDL 15 06/25/2016 0403   LDLCALC 33 06/25/2016 0403   HgbA1c: No results found for: HGBA1C Urine Drug Screen: No results found for: LABOPIA, COCAINSCRNUR, LABBENZ, AMPHETMU, THCU, LABBARB    IMAGING  Ct Head Wo Contrast 06/24/2016 No acute intracranial process.     MRA Neck - pending    MRI / MRA Brain - pending      PHYSICAL EXAM Constitutional: Appears well-developed and well-nourished.  Psych: Affect appropriate to situation Eyes: No scleral  injection HENT: No OP obstrucion Head: Normocephalic.  Cardiovascular: Normal rate and regular rhythm.  Respiratory: Effort normal and breath sounds normal to anterior ascultation GI: Soft.  No distension. There is no tenderness.  Skin: WDI  Neuro: Mental Status: Patient is awake, alert, oriented to person, place, month, year, and situation. Patient is able to give a clear and coherent history. No signs of aphasia or neglect Cranial Nerves: II: She has a left hemianopia Pupils are equal, round, and reactive to light.   III,IV, VI: EOMI without ptosis or diploplia.  V: Facial sensation is symmetric to temperature VII: Facial movement is symmetric.  VIII: hearing is intact to voice X: Uvula elevates symmetrically XI: Shoulder shrug is symmetric. XII: tongue is midline without atrophy or fasciculations.  Motor: Tone is normal. Bulk is normal. 5/5 strength was present in all four extremities. Some arthritic changes in hands Sensory: Sensation is symmetric to light touch and temperature in the arms and legs. Cerebellar: FNF with mild tremor bilaterally     ASSESSMENT/PLAN Whitney Marquez is a 82 y.o. female with history of hypertension, breast and colon cancer, and aortic valve stenosis  presenting with confusion and visual disturbance.     Currently asymptomatic. Preliminary MRI images were reviewed which  is negative for acute stroke. Official read pending. Differential diagnoses include TIA versus metabolic encephalopathy secondary to urinary tract infection     MRI  pending  MRA  pending  MRA neck - pending  2D Echo - pending  LDL - 33  HgbA1c pending  VTE prophylaxis - Lovenox Diet Heart Room service appropriate? Yes; Fluid consistency: Thin  aspirin 81 mg daily prior to admission, now on aspirin 325 mg daily  Patient counseled to be compliant with her antithrombotic medications  Ongoing aggressive stroke risk factor management  Therapy  recommendations: Home health physical therapy recommended  Disposition: Pending  Hypertension  Occasionally low blood pressures  Permissive hypertension (OK if < 220/120) but gradually normalize in 5-7 days  Long-term BP goal normotensive  Hyperlipidemia  Home meds:  Fenofibrate resumed in hospital  LDL 33, goal < 70     Other Stroke Risk Factors  Advanced age  Family hx stroke (father)   Other Active Problems  Leukocytosis - 14.0 (afebrile)  Findings were discussed in detail with patient's daughter    To contact Stroke Continuity provider, please refer to [email or web address] After hours, contact General Neurology

## 2016-06-25 NOTE — Progress Notes (Addendum)
Patient ID: SECRET CRADLE, female   DOB: October 22, 1922, 82 y.o.   MRN: [id number]  PROGRESS NOTE    Whitney Marquez  [id number] DOB: July 16, 1923 DOA: 06/24/2016  PCP: Mathews Argyle, MD   Brief Narrative:  82 y.o. female with past medical history significant for breast and colon cancer in remission, hypertension, aortic valve stenosis who presented to [facility] after being found by her daughter at home very confused. The daughter reported when she walked in to patient's home "it was a mess". Apparently patient did not know how to use the dishwasher, it seems like she has forgotten how to dress. Patient reports that she never had slurred speech is just that she could not remember how to say certain things for example she would look at the clock and could not remember what is it. She also reports that her mom seems to have had a vision loss because if she would stand on the side as if she didn't notice her so she had to step in front of her at which point her mother would acknowledge her presence.  Patient was hemodynamically stable on the admission. Blood work was notable for leukocytosis of 14 otherwise unremarkable. CT head showed no acute intracranial process. Patient was seen by neurology in consultation.   Assessment & Plan:   Principal Problem:   CVA (cerebral infarction) Stroke work up initiated:  - Aspirin daily - MRI brain / MRA brain - pending  - 2D ECHO - pending  - Carotid doppler - pending  - HgbA1c - pending - LDL 33 - Diet: regular diet, passed swallow screen  - Therapy: PT/OT - pending  - Appreciate neurology following  Active Problems:   Essential hypertension - Currently not on any antihypertensives - Blood pressure is 132/71 - At home she is taking Norvasc    Hyperlipidemia - Continue fenofibrate - LDL is 33    UTI (lower urinary tract infection) / Leukocytosis  - Continue Bactrim - Urine culture pending   DVT prophylaxis: Lovenox  subcutaneous Code Status: DNR/DNI  Family Communication: Daughter at the bedside Disposition Plan: Home once cleared by neurology. She will require PT evaluation for safe discharge plan.   Consultants:   Neurology  Procedures:   2 D ECHO pending as of today  Antimicrobials:   Bactrim 06/24/2016 -->    Subjective: No overnight events.  Objective: Vitals:   06/25/16 0000 06/25/16 0200 06/25/16 0400 06/25/16 0600  BP: (!) 101/59 (!) 151/81 (!) 105/56 108/64  Pulse: 73 73 72 73  Resp: 18 18    Temp: 97.4 F (36.3 C) 97.4 F (36.3 C) 97.4 F (36.3 C) 97.6 F (36.4 C)  TempSrc: Oral Oral Oral Oral  SpO2: 93% 91% 92% 93%  Weight:      Height:       No intake or output data in the 24 hours ending 06/25/16 0903 Filed Weights   06/24/16 2000  Weight: 54.1 kg (119 lb 4.3 oz)    Examination:  General exam: Appears calm and comfortable  Respiratory system: Clear to auscultation. Respiratory effort normal. Cardiovascular system: S1 & S2 heard, RRR. No pedal edema. Gastrointestinal system: Abdomen is nondistended, soft and nontender. No organomegaly or masses felt. Normal bowel sounds heard. Central nervous system: No focal neurological deficits. Extremities: Symmetric 5 x 5 power. Skin: No rashes, lesions or ulcers Psychiatry: Judgement and insight appear normal. Mood & affect appropriate.   Data Reviewed: I have personally reviewed following labs and imaging studies  CBC:  Recent Labs Lab 06/24/16 1408 06/24/16 1446  WBC 14.0*  --   NEUTROABS 10.2*  --   HGB 11.8* 13.3  HCT 36.8 39.0  MCV 89.5  --   PLT 316  --    Basic Metabolic Panel:  Recent Labs Lab 06/24/16 1408 06/24/16 1446  NA 135 136  K 4.1 3.9  CL 101 100*  CO2 22  --   GLUCOSE 111* 110*  BUN 16 18  CREATININE 0.50 0.50  CALCIUM 9.1  --    GFR: Estimated Creatinine Clearance: 37.9 mL/min (by C-G formula based on SCr of 0.5 mg/dL). Liver Function Tests:  Recent Labs Lab  06/24/16 1408  AST 25  ALT 14  ALKPHOS 38  BILITOT 0.4  PROT 6.0*  ALBUMIN 3.5   No results for input(s): LIPASE, AMYLASE in the last 168 hours. No results for input(s): AMMONIA in the last 168 hours. Coagulation Profile:  Recent Labs Lab 06/24/16 1408  INR 1.01   Cardiac Enzymes: No results for input(s): CKTOTAL, CKMB, CKMBINDEX, TROPONINI in the last 168 hours. BNP (last 3 results) No results for input(s): PROBNP in the last 8760 hours. HbA1C: No results for input(s): HGBA1C in the last 72 hours. CBG: No results for input(s): GLUCAP in the last 168 hours. Lipid Profile:  Recent Labs  06/25/16 0403  CHOL 124  HDL 76  LDLCALC 33  TRIG 75  CHOLHDL 1.6   Thyroid Function Tests: No results for input(s): TSH, T4TOTAL, FREET4, T3FREE, THYROIDAB in the last 72 hours. Anemia Panel: No results for input(s): VITAMINB12, FOLATE, FERRITIN, TIBC, IRON, RETICCTPCT in the last 72 hours. Urine analysis:    Component Value Date/Time   COLORURINE YELLOW 06/24/2016 1720   APPEARANCEUR CLOUDY (A) 06/24/2016 1720   LABSPEC 1.009 06/24/2016 1720   PHURINE 7.5 06/24/2016 1720   GLUCOSEU NEGATIVE 06/24/2016 1720   HGBUR NEGATIVE 06/24/2016 1720   BILIRUBINUR NEGATIVE 06/24/2016 1720   KETONESUR NEGATIVE 06/24/2016 1720   PROTEINUR NEGATIVE 06/24/2016 1720   UROBILINOGEN 0.2 10/15/2014 0125   NITRITE POSITIVE (A) 06/24/2016 1720   LEUKOCYTESUR LARGE (A) 06/24/2016 1720   Sepsis Labs: @LABRCNTIP (procalcitonin:4,lacticidven:4)   )No results found for this or any previous visit (from the past 240 hour(s)).    Radiology Studies: Ct Head Wo Contrast Result Date: 06/24/2016 No acute intracranial process. Electronically Signed   By: Dorise Bullion III M.D   On: 06/24/2016 17:25     Scheduled Meds: . aspirin  300 mg Rectal Daily   Or  . aspirin  325 mg Oral Daily  . calcium-vitamin D  1 tablet Oral TID WC  . enoxaparin (LOVENOX) injection  40 mg Subcutaneous Q24H  .  fenofibrate  54 mg Oral Daily  . pantoprazole  40 mg Oral Daily  . sulfamethoxazole-trimethoprim  1 tablet Oral Q12H   Continuous Infusions:    LOS: 1 day    Time spent: 25 minutes  Greater than 50% of the time spent on counseling and coordinating the care.   Leisa Lenz, MD [facility] Pager [phone]  If 7PM-7AM, please contact night-coverage [email or web address] Password [email or web address] 06/25/2016, 9:03 AM

## 2016-06-25 NOTE — Evaluation (Signed)
Physical Therapy Evaluation Patient Details Name: Whitney Marquez MRN: [id number] DOB: [birth year] Today's Date: 06/25/2016   History of Present Illness  Pt adm with vision changes, confusion, and aphasia. Stroke work up in progress with negative CT and MRI pending. PMH - breast CA, colon CA, HTN, arthritis, aortic stenosis.   Clinical Impression  Pt admitted with above diagnosis and presents to PT with functional limitations due to deficits listed below (See PT problem list). Pt needs skilled PT to maximize independence and safety to allow discharge to back to [facility] independent [facility]. Pt very close to her baseline. Feel she can return to her independent apt and resume her PT there.     Follow Up Recommendations Home health PT    Equipment Recommendations  None recommended by PT    Recommendations for Other Services       Precautions / Restrictions Precautions Precautions: Fall Restrictions Weight Bearing Restrictions: No      Mobility  Bed Mobility Overal bed mobility: Modified Independent             General bed mobility comments: Incr time  Transfers Overall transfer level: Needs assistance Equipment used: 4-wheeled walker Transfers: Sit to/from Stand Sit to Stand: Supervision         General transfer comment: Initially leaning posteriorly onto bed. When stood from chair no posterior lean  Ambulation/Gait Ambulation/Gait assistance: Supervision Ambulation Distance (Feet): 425 Feet Assistive device: 4-wheeled walker Gait Pattern/deviations: Step-through pattern;Decreased stride length;Drifts right/left     General Gait Details: Slightly unsteady initially but no loss of balance. After ~50' pt with incr steadiness. Occasionally bumped objects on left side with rollator but able to self correct without loss of balance.  Stairs            Wheelchair Mobility    Modified Rankin (Stroke Patients Only) Modified Rankin (Stroke Patients  Only) Pre-Morbid Rankin Score: Moderate disability Modified Rankin: Moderately severe disability     Balance Overall balance assessment: Needs assistance Sitting-balance support: No upper extremity supported;Feet supported Sitting balance-Leahy Scale: Good     Standing balance support: No upper extremity supported;During functional activity Standing balance-Leahy Scale: Fair                               Pertinent Vitals/Pain Pain Assessment: No/denies pain    Home Living Family/patient expects to be discharged to:: Private residence ([facility]) Living Arrangements: Alone Available Help at Discharge: Available PRN/intermittently;Family;Friend(s) Type of Home: Independent living facility Home Access: Level entry     Home Layout: One level Home Equipment: Walker - 4 wheels;Cane - quad;Grab bars - tub/shower;Grab bars - toilet Additional Comments: Has some type of bed rail    Prior Function Level of Independence: Independent with assistive device(s)         Comments: Uses rollator at all times     Hand Dominance        Extremity/Trunk Assessment   Upper Extremity Assessment: Defer to OT evaluation           Lower Extremity Assessment: Overall WFL for tasks assessed      Cervical / Trunk Assessment: Kyphotic  Communication   Communication: No difficulties  Cognition Arousal/Alertness: Awake/alert Behavior During Therapy: WFL for tasks assessed/performed Overall Cognitive Status: Within Functional Limits for tasks assessed                      General Comments  Exercises     Assessment/Plan    PT Assessment Patient needs continued PT services  PT Problem List Decreased balance;Decreased mobility          PT Treatment Interventions Gait training;Functional mobility training;Therapeutic activities;Therapeutic exercise;Balance training;Patient/family education    PT Goals (Current goals can be found  in the Care Plan section)  Acute Rehab PT Goals Patient Stated Goal: Return to [facility] PT Goal Formulation: With patient/family Time For Goal Achievement: 06/30/16 Potential to Achieve Goals: Good    Frequency Min 3X/week   Barriers to discharge        Co-evaluation               End of Session Equipment Utilized During Treatment: Gait belt Activity Tolerance: Patient tolerated treatment well Patient left: in chair;with call bell/phone within reach;with chair alarm set;with family/visitor present Nurse Communication: Mobility status         Time: 1030-1050 PT Time Calculation (min) (ACUTE ONLY): 20 min   Charges:   PT Evaluation $PT Eval Moderate Complexity: 1 Procedure     PT G Codes:        Lorey Pallett 07/14/2016, 11:43 AM Suanne Marker PT [phone]

## 2016-06-25 NOTE — Evaluation (Signed)
Occupational Therapy Evaluation Patient Details Name: Whitney Marquez MRN: [id number] DOB: 05/20/1923 Today's Date: 06/25/2016    History of Present Illness Pt adm with vision changes, confusion, and aphasia. Stroke work up in progress with negative CT and MRI pending. PMH - breast CA, colon CA, HTN, arthritis, aortic stenosis.    Clinical Impression   Pt reports she was independent with ADL PTA. Currently pt overall supervision for safety with ADL and functional mobility. Pts daughter reports she feels pt has returned to baseline cognitively however; during session noted pt to have mild intermittent confusion, ?word finding difficulties, and repeating herself when recalling events that led to hospitalization. Pt planning to d/c back to [facility] independent [facility] with intermittent family and staff supervision. Recommending HHOT for follow up to maximize independence and safety with ADL and functional mobility upon return home. Pt would benefit from continued skilled OT to address established goals.    Follow Up Recommendations  Home health OT;Supervision - Intermittent (Return to [facility])    Equipment Recommendations  None recommended by OT    Recommendations for Other Services Speech consult     Precautions / Restrictions Precautions Precautions: Fall Restrictions Weight Bearing Restrictions: No      Mobility Bed Mobility Overal bed mobility: Modified Independent             General bed mobility comments: Pt OOB in chair upon arrival.  Transfers Overall transfer level: Needs assistance Equipment used: 4-wheeled walker Transfers: Sit to/from Stand Sit to Stand: Supervision         General transfer comment: Supervision for safety. Cues to lock RW and for hand placement.    Balance Overall balance assessment: Needs assistance Sitting-balance support: Feet supported;No upper extremity supported Sitting balance-Leahy Scale: Good     Standing balance  support: Bilateral upper extremity supported Standing balance-Leahy Scale: Fair                              ADL Overall ADL's : Needs assistance/impaired Eating/Feeding: Set up;Sitting   Grooming: Supervision/safety;Standing   Upper Body Bathing: Set up;Supervision/ safety;Sitting   Lower Body Bathing: Supervison/ safety;Set up;Sit to/from stand   Upper Body Dressing : Supervision/safety;Set up;Sitting   Lower Body Dressing: Supervision/safety;Set up;Sit to/from stand   Toilet Transfer: Supervision/safety;Ambulation;RW;Comfort height toilet Toilet Transfer Details (indicate cue type and reason): Simulated by sit to stand from chair with functional mobility in room. Toileting- Clothing Manipulation and Hygiene: Supervision/safety;Sit to/from stand       Functional mobility during ADLs: Supervision/safety;Rolling walker General ADL Comments: Pt with intermittent confusion and repeating self when attempting to explain events that led to hospitalization; daughter reports she feels pt is at baseline cognitively. Discussed follow up recommendations with pt and her daughter; they are agreeable.      Vision Additional Comments: Needs further assessment.   Perception     Praxis      Pertinent Vitals/Pain Pain Assessment: No/denies pain     Hand Dominance Right   Extremity/Trunk Assessment Upper Extremity Assessment Upper Extremity Assessment: Generalized weakness   Lower Extremity Assessment Lower Extremity Assessment: Defer to PT evaluation   Cervical / Trunk Assessment Cervical / Trunk Assessment: Kyphotic   Communication Communication Communication: No difficulties   Cognition Arousal/Alertness: Awake/alert Behavior During Therapy: WFL for tasks assessed/performed Overall Cognitive Status:  (mildly confused at times; repeating herself; ?word finding)  General Comments       Exercises       Shoulder Instructions       Home Living Family/patient expects to be discharged to:: Private residence ([facility]) Living Arrangements: Alone Available Help at Discharge: Available PRN/intermittently;Family;Friend(s) Type of Home: Independent living facility Home Access: Level entry     Home Layout: One level     Bathroom Shower/Tub: Occupational psychologist: Handicapped height Bathroom Accessibility: Yes How Accessible: Accessible via walker Home Equipment: [place] - 4 wheels;Cane - quad;Grab bars - tub/shower;Grab bars - toilet;Shower seat   Additional Comments: Has some type of bed rail      Prior Functioning/Environment Level of Independence: Independent with assistive device(s)        Comments: Uses rollator at all times        OT Problem List: Decreased strength;Impaired balance (sitting and/or standing);Decreased cognition;Decreased safety awareness;Decreased knowledge of use of DME or AE   OT Treatment/Interventions: Self-care/ADL training;Energy conservation;DME and/or AE instruction;Therapeutic activities;Cognitive remediation/compensation;Patient/family education;Balance training    OT Goals(Current goals can be found in the care plan section) Acute Rehab OT Goals Patient Stated Goal: Return to [facility] OT Goal Formulation: With patient Time For Goal Achievement: 07/09/16 Potential to Achieve Goals: Good ADL Goals Additional ADL Goal #1: Pt will correctly verbally identify 5/5 ADL items and use appropriately for ADL. Additional ADL Goal #2: Pt will demonstrate independence and safety with use of rollator 100% of the time without cues. Additional ADL Goal #3: Pt will score >25 on the MOCA to demonstrate appropriate cognition for independence and safety with ADL and functional mobility.  OT Frequency: Min 2X/week   Barriers to D/C:            Co-evaluation              End of Session Equipment Utilized During Treatment: Surveyor, mining  Communication: Mobility status (RN observed mobility)  Activity Tolerance: Patient tolerated treatment well Patient left: in chair;with call bell/phone within reach;with chair alarm set;with family/visitor present   Time: 1202-1229 OT Time Calculation (min): 27 min Charges:  OT General Charges $OT Visit: 1 Procedure OT Evaluation $OT Eval Moderate Complexity: 1 Procedure OT Treatments $Self Care/Home Management : 8-22 mins G-Codes:     Binnie Kand M.S., OTR/L Pager: [phone]  06/25/2016, 2:22 PM

## 2016-06-26 ENCOUNTER — Inpatient Hospital Stay (HOSPITAL_BASED_OUTPATIENT_CLINIC_OR_DEPARTMENT_OTHER): Payer: Medicare Other

## 2016-06-26 ENCOUNTER — Inpatient Hospital Stay (HOSPITAL_COMMUNITY)
Admit: 2016-06-26 | Discharge: 2016-06-26 | Disposition: A | Discharge status: Home / Self Care | Payer: Medicare Other | Attending: Neurology | Admitting: Neurology

## 2016-06-26 DIAGNOSIS — F05 Delirium due to known physiological condition: Secondary | ICD-10-CM | POA: Diagnosis not present

## 2016-06-26 DIAGNOSIS — G9340 Encephalopathy, unspecified: Secondary | ICD-10-CM

## 2016-06-26 DIAGNOSIS — I6789 Other cerebrovascular disease: Secondary | ICD-10-CM

## 2016-06-26 DIAGNOSIS — M6281 Muscle weakness (generalized): Secondary | ICD-10-CM

## 2016-06-26 DIAGNOSIS — I10 Essential (primary) hypertension: Secondary | ICD-10-CM | POA: Diagnosis not present

## 2016-06-26 DIAGNOSIS — I639 Cerebral infarction, unspecified: Secondary | ICD-10-CM | POA: Diagnosis not present

## 2016-06-26 DIAGNOSIS — R410 Disorientation, unspecified: Secondary | ICD-10-CM | POA: Diagnosis not present

## 2016-06-26 DIAGNOSIS — N390 Urinary tract infection, site not specified: Secondary | ICD-10-CM | POA: Diagnosis not present

## 2016-06-26 LAB — ECHOCARDIOGRAM COMPLETE
HEIGHTINCHES: 63 in
Weight: 1908.3 oz

## 2016-06-26 LAB — HEMOGLOBIN A1C
HEMOGLOBIN A1C: 5.5 % (ref 4.8–5.6)
Mean Plasma Glucose: 111 mg/dL

## 2016-06-26 MED ORDER — ASPIRIN EC 81 MG PO TBEC
81.0000 mg | DELAYED_RELEASE_TABLET | Freq: Every day | ORAL | Status: DC
Start: 2016-06-27 — End: 2016-06-26

## 2016-06-26 MED ORDER — SULFAMETHOXAZOLE-TRIMETHOPRIM 800-160 MG PO TABS: 1.0000 | ORAL_TABLET | Freq: Two times a day (BID) | ORAL | 0 refills | Status: DC

## 2016-06-26 NOTE — Progress Notes (Signed)
EEG Completed; Results Pending  

## 2016-06-26 NOTE — Discharge Summary (Signed)
Physician Discharge Summary  Whitney Marquez [id number] DOB: March 18, 1923 DOA: 06/24/2016  PCP: Mathews Argyle, MD  Admit date: 06/24/2016 Discharge date: 06/26/2016  [facility] From[facility] Disposition: [facility]   Recommendations for Outpatient Follow-up:  1. Follow up with PCP in 1-2 weeks 2. Continue bactrim to complete full course of treatment for UTI, thought to be etiology of AMS now cleared. 3. Please follow up on the following pending results: 1. 2D echocardiogram  Home Health: PT and OT recommended Equipment/Devices: None   Discharge Condition: Stable CODE STATUS: DNR Diet recommendation: Heart healthy  Brief/Interim Summary: 82 y.o.femalewith past medical history significant for breast and colon cancer in remission, hypertension, aortic valve stenosis who presented to [facility] after being found by her daughter at home very confused. The daughter reported when she walked in to patient's home "it was a mess". Apparently patient did not know how to use the dishwasher, it seems like she has forgotten how to dress. Patient reports that she never had slurred speech is just that she could not remember how to say certain things for example she would look at the clock and could not remember what is it. She also reports that her mom seems to have had a vision loss because if she would stand on the side as if she didn't notice her so she had to step in front of her at which point her mother would acknowledge her presence.  Patient was hemodynamically stable on the admission. Blood work was notable for leukocytosis of 14 otherwise unremarkable. CT head showed no acute intracranial process. Subsequently MRI and MRA of the brain/head were negative for stroke or significant stenosis. Neck MRA showed 50% stenosis of right common carotid artery, moderate-severe bilateral ECA stenosis. EEG was normal. Mental status returned to baseline  with the treatment of UTI. Treatment will be continued. Aspirin will remain 81mg  daily. Therapy consults recommended discharge back to [facility] with home health services. These are ordered prior to discharge.   Discharge Diagnoses:  Principal Problem:   CVA (cerebral infarction) Active Problems:   Essential hypertension   Hyperlipidemia   Confusion   UTI (lower urinary tract infection)  Discharge Instructions Discharge Instructions    Discharge instructions    Complete by:  As directed    You were admitted for altered mental status. There was concern that a stroke may have caused this, or a seizure, but work up for these causes has been negative.  - Continue taking aspirin 81mg  daily as you were - Take bactrim as directed twice per day (first dose will be this evening) until you are out of medication (last dose will be the morning of 9/27).  - If your symptoms return, please present to the emergency department.       Medication List    TAKE these medications   alendronate 70 MG tablet Commonly known as:  FOSAMAX Take 70 mg by mouth once a week. Take with a full glass of water on an empty stomach.   ALEVE 220 MG tablet Generic drug:  naproxen sodium Take 220 mg by mouth at bedtime.   amLODipine 5 MG tablet Commonly known as:  NORVASC Take 5 mg by mouth daily.   aspirin 81 MG tablet Take 81 mg by mouth daily.   Calcium-Vitamin D-Vitamin K 500-100-40 MG-UNT-MCG Chew Chew 1 tablet by mouth 3 (three) times daily with meals.   CENTRUM SILVER PO Take 1 tablet by mouth daily.   fenofibrate  54 MG tablet Take 54 mg by mouth daily.   HYDROcodone-acetaminophen 5-325 MG tablet Commonly known as:  NORCO/VICODIN Take 1 tablet by mouth every 6 (six) hours as needed for moderate pain.   omeprazole 20 MG capsule Commonly known as:  PRILOSEC Take 20 mg by mouth daily.   REFRESH OP Apply 1-2 drops to eye at bedtime as needed (for dry eyes).    sulfamethoxazole-trimethoprim 800-160 MG tablet Commonly known as:  BACTRIM DS,SEPTRA DS Take 1 tablet by mouth every 12 (twelve) hours.       Allergies  Allergen Reactions  . Cephalosporins Anaphylaxis  . Cephalosporins   . Iodine   . Macrolides And Ketolides   . Other Swelling    Walnuts causes lips swelling  . Penicillins   . Pyridium [Phenazopyridine Hcl]   . Macrobid [Nitrofurantoin Monohyd Macro] Swelling and Rash  . Penicillins Hives and Rash  . Shellfish Allergy Rash    Consultations:  Stroke team, Dr. Erlinda Hong  Procedures/Studies: Ct Head Wo Contrast  Result Date: 06/24/2016 CLINICAL DATA:  Episode of confusion this morning. The symptoms have improved. No headache. EXAM: CT HEAD WITHOUT CONTRAST TECHNIQUE: Contiguous axial images were obtained from the base of the skull through the vertex without intravenous contrast. COMPARISON:  MRI August 19, 2008 FINDINGS: Brain: No subdural, epidural, or subarachnoid hemorrhage. Cerebellum, brainstem, and basal cisterns are normal. Ventricles and sulci are unremarkable for age. No mass, mass effect, or midline shift. Mild white matter changes. No acute cortical ischemia or infarct. Vascular: Calcified atherosclerosis is seen in the intracranial carotid arteries. Skull: Normal. Negative for fracture or focal lesion. Sinuses/Orbits: No acute finding. Other: Anterior listhesis of C4 versus C5 on the scout view is unchanged when compared to the cervical spine x-ray from September 2014. IMPRESSION: No acute intracranial process. Electronically Signed   By: Dorise Bullion III M.D   On: 06/24/2016 17:25   Mr Jodene Nam Head Wo Contrast  Result Date: 06/25/2016 CLINICAL DATA:  Acute onset confusion and left hemianopia. EXAM: MRI HEAD WITHOUT CONTRAST MRA HEAD WITHOUT CONTRAST MRA NECK WITHOUT AND WITH CONTRAST TECHNIQUE: Multiplanar, multiecho pulse sequences of the brain and surrounding structures were obtained without intravenous contrast.  Angiographic images of the Circle of Willis were obtained using MRA technique without intravenous contrast. Angiographic images of the neck were obtained using MRA technique without and with intravenous contrast. Carotid stenosis measurements (when applicable) are obtained utilizing NASCET criteria, using the distal internal carotid diameter as the denominator. CONTRAST:  59mL MULTIHANCE GADOBENATE DIMEGLUMINE 529 MG/ML IV SOLN COMPARISON:  Head CT 06/24/2016 and MRI 08/19/2008 FINDINGS: MRI HEAD FINDINGS Brain: There is no evidence of acute infarct, intracranial hemorrhage, mass, midline shift, or extra-axial fluid collection. There is moderate cerebral atrophy, mildly progressed from the prior MRI. Patchy T2 hyperintensities in the subcortical and deep cerebral white matter have also progressed and are nonspecific but compatible with moderate chronic small vessel ischemic disease. There is a small chronic right which is new from the prior MRI cerebellar infarct. Chronic lacunar infarcts in the corpus callosum are unchanged. Vascular: Major intracranial vascular flow voids are preserved. Skull and upper cervical spine: No suspicious focal osseous lesion. Partially visualized cervical disc degeneration, advanced at C5-6. Grade 1 anterolisthesis of C3 on C4 and C4 on C5. Sinuses/Orbits: Prior bilateral cataract extraction. Trace left sphenoid sinus mucosal thickening. Clear mastoid air cells. Other: None. MRA HEAD FINDINGS The visualized distal vertebral arteries are widely patent with the left being slightly dominant. Left PICA origin  is patent. Right PICA is not identified. AICA and SCA origins are patent. Basilar artery is widely patent. There is a patent left posterior communicating artery with mildly hypoplastic left P1 segment. No significant proximal PCA stenosis is seen. The internal carotid arteries are widely patent from skullbase to carotid termini. There is mild cavernous segment irregularity bilaterally.  ACAs and MCAs are patent without evidence of major branch occlusion or significant proximal stenosis. No intracranial aneurysm is identified. MRA NECK FINDINGS Standard aortic arch configuration. Brachiocephalic and subclavian arteries appear widely patent. Eccentric plaque at the carotid bifurcation on the right results in approximately 50% distal common carotid artery stenosis as well as moderate to severe proximal ECA stenosis. The right ICA is patent and tortuous proximally without significant stenosis. The left common carotid artery is widely patent. The left cervical ICA is patent and tortuous in its proximal to midportion with focal kinking. There is moderate to severe left ECA origin stenosis. The vertebral arteries are patent with antegrade flow bilaterally and with the left being mildly dominant. No significant vertebral artery stenosis is identified, although both V3 segments demonstrated a mildly beaded appearance. IMPRESSION: 1. No acute intracranial abnormality. 2. Moderate chronic small vessel ischemic disease with chronic lacunar infarcts as above, progressed from 2009. 3. No major intracranial arterial occlusion or significant stenosis. 4. 50% distal right common carotid artery stenosis. 5. Tortuous left cervical ICA with focal kinking. 6. Moderate to severe proximal external carotid artery stenosis bilaterally. 7. Suspected mild fibromuscular dysplasia in the vertebral arteries. Electronically Signed   By: Logan Bores M.D.   On: 06/25/2016 16:37   Mr Angiogram Neck W Wo Contrast  Result Date: 06/25/2016 CLINICAL DATA:  Acute onset confusion and left hemianopia. EXAM: MRI HEAD WITHOUT CONTRAST MRA HEAD WITHOUT CONTRAST MRA NECK WITHOUT AND WITH CONTRAST TECHNIQUE: Multiplanar, multiecho pulse sequences of the brain and surrounding structures were obtained without intravenous contrast. Angiographic images of the Circle of Willis were obtained using MRA technique without intravenous contrast.  Angiographic images of the neck were obtained using MRA technique without and with intravenous contrast. Carotid stenosis measurements (when applicable) are obtained utilizing NASCET criteria, using the distal internal carotid diameter as the denominator. CONTRAST:  51mL MULTIHANCE GADOBENATE DIMEGLUMINE 529 MG/ML IV SOLN COMPARISON:  Head CT 06/24/2016 and MRI 08/19/2008 FINDINGS: MRI HEAD FINDINGS Brain: There is no evidence of acute infarct, intracranial hemorrhage, mass, midline shift, or extra-axial fluid collection. There is moderate cerebral atrophy, mildly progressed from the prior MRI. Patchy T2 hyperintensities in the subcortical and deep cerebral white matter have also progressed and are nonspecific but compatible with moderate chronic small vessel ischemic disease. There is a small chronic right which is new from the prior MRI cerebellar infarct. Chronic lacunar infarcts in the corpus callosum are unchanged. Vascular: Major intracranial vascular flow voids are preserved. Skull and upper cervical spine: No suspicious focal osseous lesion. Partially visualized cervical disc degeneration, advanced at C5-6. Grade 1 anterolisthesis of C3 on C4 and C4 on C5. Sinuses/Orbits: Prior bilateral cataract extraction. Trace left sphenoid sinus mucosal thickening. Clear mastoid air cells. Other: None. MRA HEAD FINDINGS The visualized distal vertebral arteries are widely patent with the left being slightly dominant. Left PICA origin is patent. Right PICA is not identified. AICA and SCA origins are patent. Basilar artery is widely patent. There is a patent left posterior communicating artery with mildly hypoplastic left P1 segment. No significant proximal PCA stenosis is seen. The internal carotid arteries are widely patent from skullbase  to carotid termini. There is mild cavernous segment irregularity bilaterally. ACAs and MCAs are patent without evidence of major branch occlusion or significant proximal stenosis. No  intracranial aneurysm is identified. MRA NECK FINDINGS Standard aortic arch configuration. Brachiocephalic and subclavian arteries appear widely patent. Eccentric plaque at the carotid bifurcation on the right results in approximately 50% distal common carotid artery stenosis as well as moderate to severe proximal ECA stenosis. The right ICA is patent and tortuous proximally without significant stenosis. The left common carotid artery is widely patent. The left cervical ICA is patent and tortuous in its proximal to midportion with focal kinking. There is moderate to severe left ECA origin stenosis. The vertebral arteries are patent with antegrade flow bilaterally and with the left being mildly dominant. No significant vertebral artery stenosis is identified, although both V3 segments demonstrated a mildly beaded appearance. IMPRESSION: 1. No acute intracranial abnormality. 2. Moderate chronic small vessel ischemic disease with chronic lacunar infarcts as above, progressed from 2009. 3. No major intracranial arterial occlusion or significant stenosis. 4. 50% distal right common carotid artery stenosis. 5. Tortuous left cervical ICA with focal kinking. 6. Moderate to severe proximal external carotid artery stenosis bilaterally. 7. Suspected mild fibromuscular dysplasia in the vertebral arteries. Electronically Signed   By: Logan Bores M.D.   On: 06/25/2016 16:37   Mr Brain Wo Contrast  Result Date: 06/25/2016 CLINICAL DATA:  Acute onset confusion and left hemianopia. EXAM: MRI HEAD WITHOUT CONTRAST MRA HEAD WITHOUT CONTRAST MRA NECK WITHOUT AND WITH CONTRAST TECHNIQUE: Multiplanar, multiecho pulse sequences of the brain and surrounding structures were obtained without intravenous contrast. Angiographic images of the Circle of Willis were obtained using MRA technique without intravenous contrast. Angiographic images of the neck were obtained using MRA technique without and with intravenous contrast. Carotid  stenosis measurements (when applicable) are obtained utilizing NASCET criteria, using the distal internal carotid diameter as the denominator. CONTRAST:  16mL MULTIHANCE GADOBENATE DIMEGLUMINE 529 MG/ML IV SOLN COMPARISON:  Head CT 06/24/2016 and MRI 08/19/2008 FINDINGS: MRI HEAD FINDINGS Brain: There is no evidence of acute infarct, intracranial hemorrhage, mass, midline shift, or extra-axial fluid collection. There is moderate cerebral atrophy, mildly progressed from the prior MRI. Patchy T2 hyperintensities in the subcortical and deep cerebral white matter have also progressed and are nonspecific but compatible with moderate chronic small vessel ischemic disease. There is a small chronic right which is new from the prior MRI cerebellar infarct. Chronic lacunar infarcts in the corpus callosum are unchanged. Vascular: Major intracranial vascular flow voids are preserved. Skull and upper cervical spine: No suspicious focal osseous lesion. Partially visualized cervical disc degeneration, advanced at C5-6. Grade 1 anterolisthesis of C3 on C4 and C4 on C5. Sinuses/Orbits: Prior bilateral cataract extraction. Trace left sphenoid sinus mucosal thickening. Clear mastoid air cells. Other: None. MRA HEAD FINDINGS The visualized distal vertebral arteries are widely patent with the left being slightly dominant. Left PICA origin is patent. Right PICA is not identified. AICA and SCA origins are patent. Basilar artery is widely patent. There is a patent left posterior communicating artery with mildly hypoplastic left P1 segment. No significant proximal PCA stenosis is seen. The internal carotid arteries are widely patent from skullbase to carotid termini. There is mild cavernous segment irregularity bilaterally. ACAs and MCAs are patent without evidence of major branch occlusion or significant proximal stenosis. No intracranial aneurysm is identified. MRA NECK FINDINGS Standard aortic arch configuration. Brachiocephalic and  subclavian arteries appear widely patent. Eccentric plaque at the carotid  bifurcation on the right results in approximately 50% distal common carotid artery stenosis as well as moderate to severe proximal ECA stenosis. The right ICA is patent and tortuous proximally without significant stenosis. The left common carotid artery is widely patent. The left cervical ICA is patent and tortuous in its proximal to midportion with focal kinking. There is moderate to severe left ECA origin stenosis. The vertebral arteries are patent with antegrade flow bilaterally and with the left being mildly dominant. No significant vertebral artery stenosis is identified, although both V3 segments demonstrated a mildly beaded appearance. IMPRESSION: 1. No acute intracranial abnormality. 2. Moderate chronic small vessel ischemic disease with chronic lacunar infarcts as above, progressed from 2009. 3. No major intracranial arterial occlusion or significant stenosis. 4. 50% distal right common carotid artery stenosis. 5. Tortuous left cervical ICA with focal kinking. 6. Moderate to severe proximal external carotid artery stenosis bilaterally. 7. Suspected mild fibromuscular dysplasia in the vertebral arteries. Electronically Signed   By: Logan Bores M.D.   On: 06/25/2016 16:37     Echo pending at discharge  Subjective: Pt without complaint, though wants to go home. Daughter at bedside feels she's at baseline. Had not gotten up with PT prior to assessment and was ready to do so.  Discharge Exam: Vitals:   06/26/16 0103 06/26/16 0535  BP: 130/64 (!) 144/76  Pulse: 71 68  Resp: 16 16  Temp: 98 F (36.7 C) 98.1 F (36.7 C)   Vitals:   06/25/16 1758 06/25/16 2047 06/26/16 0103 06/26/16 0535  BP: (!) 124/58 135/74 130/64 (!) 144/76  Pulse: 74 70 71 68  Resp: 18 18 16 16   Temp: 98.2 F (36.8 C) 97.8 F (36.6 C) 98 F (36.7 C) 98.1 F (36.7 C)  TempSrc: Oral Oral Oral Oral  SpO2: 98% 94% 95% 96%  Weight:      Height:        General: Pt is alert, awake, not in acute distress Cardiovascular: RRR, S1/S2 +, no rubs, no gallops Respiratory: CTA bilaterally, no wheezing, no rhonchi Abdominal: Soft, NT, ND, bowel sounds + Extremities: no edema, no cyanosis Neuro: Alert, oriented, no focal weakness/numbness  The results of significant diagnostics from this hospitalization (including imaging, microbiology, ancillary and laboratory) are listed below for reference.    Microbiology: Recent Results (from the past 240 hour(s))  Urine culture     Status: None   Collection Time: 06/24/16  5:20 PM  Result Value Ref Range Status   Specimen Description URINE, RANDOM  Final   Special Requests NONE  Final   Culture NO GROWTH  Final   Report Status 06/25/2016 FINAL  Final     Labs: BNP (last 3 results) No results for input(s): BNP in the last 8760 hours. Basic Metabolic Panel:  Recent Labs Lab 06/24/16 1408 06/24/16 1446  NA 135 136  K 4.1 3.9  CL 101 100*  CO2 22  --   GLUCOSE 111* 110*  BUN 16 18  CREATININE 0.50 0.50  CALCIUM 9.1  --    Liver Function Tests:  Recent Labs Lab 06/24/16 1408  AST 25  ALT 14  ALKPHOS 38  BILITOT 0.4  PROT 6.0*  ALBUMIN 3.5   No results for input(s): LIPASE, AMYLASE in the last 168 hours. No results for input(s): AMMONIA in the last 168 hours. CBC:  Recent Labs Lab 06/24/16 1408 06/24/16 1446  WBC 14.0*  --   NEUTROABS 10.2*  --   HGB 11.8* 13.3  HCT  36.8 39.0  MCV 89.5  --   PLT 316  --    Cardiac Enzymes: No results for input(s): CKTOTAL, CKMB, CKMBINDEX, TROPONINI in the last 168 hours. BNP: Invalid input(s): POCBNP CBG: No results for input(s): GLUCAP in the last 168 hours. D-Dimer No results for input(s): DDIMER in the last 72 hours. Hgb A1c  Recent Labs  06/25/16 0403  HGBA1C 5.5   Lipid Profile  Recent Labs  06/25/16 0403  CHOL 124  HDL 76  LDLCALC 33  TRIG 75  CHOLHDL 1.6   Thyroid function studies No results for  input(s): TSH, T4TOTAL, T3FREE, THYROIDAB in the last 72 hours.  Invalid input(s): FREET3 Anemia work up No results for input(s): VITAMINB12, FOLATE, FERRITIN, TIBC, IRON, RETICCTPCT in the last 72 hours. Urinalysis    Component Value Date/Time   COLORURINE YELLOW 06/24/2016 1720   APPEARANCEUR CLOUDY (A) 06/24/2016 1720   LABSPEC 1.009 06/24/2016 1720   PHURINE 7.5 06/24/2016 1720   GLUCOSEU NEGATIVE 06/24/2016 1720   HGBUR NEGATIVE 06/24/2016 1720   BILIRUBINUR NEGATIVE 06/24/2016 1720   KETONESUR NEGATIVE 06/24/2016 1720   PROTEINUR NEGATIVE 06/24/2016 1720   UROBILINOGEN 0.2 10/15/2014 0125   NITRITE POSITIVE (A) 06/24/2016 1720   LEUKOCYTESUR LARGE (A) 06/24/2016 1720   Sepsis Labs Invalid input(s): PROCALCITONIN,  WBC,  LACTICIDVEN Microbiology Recent Results (from the past 240 hour(s))  Urine culture     Status: None   Collection Time: 06/24/16  5:20 PM  Result Value Ref Range Status   Specimen Description URINE, RANDOM  Final   Special Requests NONE  Final   Culture NO GROWTH  Final   Report Status 06/25/2016 FINAL  Final    Time coordinating discharge: Over 63 minutes  Vance Gather, MD  [facility] 06/26/2016, 3:45 PM Pager [phone]  If 7PM-7AM, please contact night-coverage [email or web address] Password [email or web address]

## 2016-06-26 NOTE — Progress Notes (Signed)
  Echocardiogram 2D Echocardiogram has been performed.  Whitney Marquez M 06/26/2016, 3:28 PM

## 2016-06-26 NOTE — Progress Notes (Signed)
STROKE TEAM PROGRESS NOTE   HISTORY OF PRESENT ILLNESS (per record) Whitney Marquez is a 82 y.o. female who was normal when she went to bed last night, 9/22/207. When she awoke this morning, she had difficulty making her coffee, and difficulty logging into her computer. Her daughter notes that this is very atypical for her. Her daughter also noticed this morning that she was having difficulty with seeing things on her left side. She did not receive tPA d/t delay in arrival. She was admitted for further evaluation and treatment.    SUBJECTIVE (INTERVAL HISTORY) Daughter at bedside. Talks to patient every day but lives out of town. Patient alert and oriented today - daughter recounted word finding difficulties on Saturday when she arrived from out of town to go shopping. Patient answered door in her underwear, the kitchen was a "wreck", and pt confused with word finding difficulties.   OBJECTIVE Temp:  [97.8 F (36.6 C)-98.2 F (36.8 C)] 98.1 F (36.7 C) (09/25 0535) Pulse Rate:  [68-74] 68 (09/25 0535) Cardiac Rhythm: Heart block (09/25 0700) Resp:  [16-18] 16 (09/25 0535) BP: (124-144)/(58-76) 144/76 (09/25 0535) SpO2:  [94 %-98 %] 96 % (09/25 0535)  CBC:   Recent Labs Lab 06/24/16 1408 06/24/16 1446  WBC 14.0*  --   NEUTROABS 10.2*  --   HGB 11.8* 13.3  HCT 36.8 39.0  MCV 89.5  --   PLT 316  --     Basic Metabolic Panel:   Recent Labs Lab 06/24/16 1408 06/24/16 1446  NA 135 136  K 4.1 3.9  CL 101 100*  CO2 22  --   GLUCOSE 111* 110*  BUN 16 18  CREATININE 0.50 0.50  CALCIUM 9.1  --     Lipid Panel:     Component Value Date/Time   CHOL 124 06/25/2016 0403   TRIG 75 06/25/2016 0403   HDL 76 06/25/2016 0403   CHOLHDL 1.6 06/25/2016 0403   VLDL 15 06/25/2016 0403   LDLCALC 33 06/25/2016 0403   HgbA1c: No results found for: HGBA1C Urine Drug Screen: No results found for: LABOPIA, COCAINSCRNUR, LABBENZ, AMPHETMU, THCU, LABBARB    IMAGING I have personally  reviewed the radiological images below and agree with the radiology interpretations.  Ct Head Wo Contrast 06/24/2016 No acute intracranial process.   Mr Brain 72 Contrast Mr [facility] Wo Contrast Mr Angiogram Neck W Wo Contrast 06/25/2016 1. No acute intracranial abnormality. 2. Moderate chronic small vessel ischemic disease with chronic lacunar infarcts as above, progressed from 2009. 3. No major intracranial arterial occlusion or significant stenosis. 4. 50% distal right common carotid artery stenosis. 5. Tortuous left cervical ICA with focal kinking. 6. Moderate to severe proximal external carotid artery stenosis bilaterally. 7. Suspected mild fibromuscular dysplasia in the vertebral arteries.    EEG This awake and drowsy EEG is normal.    TTE - Left ventricle: The cavity size was normal. Wall thickness was   increased in a pattern of mild LVH. Systolic function was   vigorous. The estimated ejection fraction was in the range of 65%   to 70%. Doppler parameters are consistent with abnormal left   ventricular relaxation (grade 1 diastolic dysfunction). - Aortic valve: AV is thickened and heavily calcified Peak and mean   gradients through the valve are 40 and 22 mm Hg respectively   consistent with mild AS. - Mitral valve: Moderately to severely calcified, moderately   thickened annulus. Mildly thickened leaflets . There was mild  regurgitation. Valve area by continuity equation (using LVOT   flow): 1.95 cm^2. - Left atrium: The atrium was mildly dilated.   PHYSICAL EXAM Constitutional: Appears well-developed and well-nourished.  Psych: Affect appropriate to situation Eyes: No scleral injection HENT: No OP obstrucion Head: Normocephalic.  Cardiovascular: Normal rate and regular rhythm.  Respiratory: Effort normal and breath sounds normal to anterior ascultation GI: Soft.  No distension. There is no tenderness.  Skin: WDI  Neuro: Mental Status: Patient is awake, alert,  oriented to person, place, month, year, and situation. Patient is able to give a clear and coherent history. No signs of aphasia or neglect Cranial Nerves: II: She has a left hemianopia Pupils are equal, round, and reactive to light.   III,IV, VI: EOMI without ptosis or diploplia.  V: Facial sensation is symmetric to temperature VII: Facial movement is symmetric.  VIII: hearing is intact to voice X: Uvula elevates symmetrically XI: Shoulder shrug is symmetric. XII: tongue is midline without atrophy or fasciculations.  Motor: Tone is normal. Bulk is normal. 5/5 strength was present in all four extremities. Some arthritic changes in hands Sensory: Sensation is symmetric to light touch and temperature in the arms and legs. Cerebellar: FNF with mild tremor bilaterally   ASSESSMENT/PLAN Ms. Whitney Marquez is a 82 y.o. female with history of hypertension, breast and colon cancer, and aortic valve stenosis  presenting with confusion and L visual field  disturbance.     Likely metabolic encephalopathy secondary to UTI  MRI  No acute stroke  MRA head no significant intracranial stenosis  MRA neck - R CCA 50%. Suspect FMD VAs (radiology finding only)  2D Echo EF 65-70%   EEG normal  LDL - 33  HgbA1c 5.5   VTE prophylaxis - Lovenox Diet Heart Room service appropriate? Yes; Fluid consistency: Thin  aspirin 81 mg daily prior to admission, now on aspirin 325 mg daily. Ok to decrease back to 81 mg daily at home  Patient counseled to be compliant with her antithrombotic medications  Ongoing aggressive stroke risk factor management  Therapy recommendations: Home health physical & occupational therapy recommended  Disposition: pending  (lives at [facility] independent living, has [facility] therapy already in place there)  Stroke team will sign off.   No neurologic follow up recommended  Hypertension Occasionally low blood pressures 100s, otherwisde 140s BP goal  normotensive  Hyperlipidemia  Home meds:  Fenofibrate resumed in hospital  LDL 33, goal < 70  Other Stroke Risk Factors  Advanced age  Family hx stroke (father)  Other Active Problems  Leukocytosis - 14.0 (afebrile)  UTI on bactrim  Hx breast cancer  Hospital Day #2   Neurology will sign off. Please call with questions. No neuro follow up needed at this time. Thanks for the consult.  Rosalin Hawking, MD PhD Stroke Neurology 06/27/2016 12:21 AM     To contact Stroke Continuity provider, please refer to [email or web address] After hours, contact General Neurology

## 2016-06-26 NOTE — Progress Notes (Signed)
Physical Therapy Treatment Patient Details Name: Whitney Marquez MRN: [id number] DOB: [birth year] Today's Date: 19-Jul-2016    History of Present Illness Pt adm with vision changes, confusion, and aphasia. Stroke work up in progress with negative CT and MRI pending. PMH - breast CA, colon CA, HTN, arthritis, aortic stenosis.     PT Comments    Patient progressing with mobility/balance.  Still seems slightly off cognitively, though daughter feels she is back to her baseline.  Feel continued supervision at [facility] over next couple of days a good idea.  Patient states daughter will return to [place] on Thursday.  Agree with HHPT at d/c.   Follow Up Recommendations  Home health PT     Equipment Recommendations  None recommended by PT    Recommendations for Other Services       Precautions / Restrictions Precautions Precautions: Fall    Mobility  Bed Mobility Overal bed mobility: Modified Independent                Transfers   Equipment used: Rolling walker (2 wheeled) Transfers: Sit to/from Stand Sit to Stand: Supervision         General transfer comment: S for safety  Ambulation/Gait Ambulation/Gait assistance: Supervision Ambulation Distance (Feet): 300 Feet Assistive device: 4-wheeled walker Gait Pattern/deviations: Step-through pattern;Decreased stride length;Shuffle;Trunk flexed     General Gait Details: No LOB, good maneuvering of rollator around dining carts in hallway without redirection/cues; denies pain   Stairs            Wheelchair Mobility    Modified Rankin (Stroke Patients Only) Modified Rankin (Stroke Patients Only) Pre-Morbid Rankin Score: Moderate disability Modified Rankin: Moderately severe disability     Balance Overall balance assessment: Needs assistance   Sitting balance-Leahy Scale: Good       Standing balance-Leahy Scale: Fair                 High Level Balance Comments: holding railing in hallway: side  stepping, forward march, backwards walking and forward tandem with assist for all    Cognition Arousal/Alertness: Awake/alert Behavior During Therapy: [facility] for tasks assessed/performed Overall Cognitive Status: History of cognitive impairments - at baseline                      Exercises      General Comments        Pertinent Vitals/Pain Pain Assessment: No/denies pain    Home Living                      Prior Function            PT Goals (current goals can now be found in the care plan section) Progress towards PT goals: Progressing toward goals    Frequency    Min 3X/week      PT Plan Current plan remains appropriate    Co-evaluation             End of Session Equipment Utilized During Treatment: Gait belt Activity Tolerance: Patient tolerated treatment well Patient left: with call bell/phone within reach;in bed;with bed alarm set     Time: 1110-1138 PT Time Calculation (min) (ACUTE ONLY): 28 min  Charges:  $Gait Training: 8-22 mins $Therapeutic Activity: 8-22 mins                    G Codes:      Reginia Naas 19-Jul-2016, 12:12 PM Magda Kiel, [place] 2016-07-19

## 2016-06-26 NOTE — Evaluation (Signed)
SLP Cancellation Note  Patient Details Name: CIRCE IWAI MRN: [id number] DOB: 05-05-1923   Cancelled treatment:       Reason Eval/Treat Not Completed: Patient at procedure or test/unavailable (pt off the floor, will reattempt)   Luanna Salk, [facility] SLP [phone]

## 2016-06-26 NOTE — Care Management CC44 (Signed)
Condition Code 44 Documentation Completed  Patient Details  Name: Whitney Marquez MRN: [id number] Date of Birth: January 25, 1923   Condition Code 44 given:  Yes Patient signature on Condition Code 44 notice:  Yes Documentation of 2 MD's agreement:  Yes Code 44 added to claim:  Yes    Pollie Friar, RN 06/26/2016, 4:42 PM

## 2016-06-26 NOTE — Progress Notes (Signed)
Patient discharged via wheel chair.  Patient alert and in stable condition.  All personal items sent with patient.

## 2016-06-26 NOTE — Procedures (Signed)
ELECTROENCEPHALOGRAM REPORT  Date of Study: 06/26/2016  Patient's Name: Whitney Marquez MRN: [id number] Date of Birth: [birth year]  Referring Provider: Rosalin Hawking, MD  Clinical History: 82 year old woman with transient confusion  Medications: aspirin suppository 300 mg   L1 aspirin tablet 325 mg   calcium-vitamin D (OSCAL WITH D) 500-200 MG-UNIT per tablet 1 tablet   enoxaparin (LOVENOX) injection 40 mg   fenofibrate tablet 54 mg   HYDROcodone-acetaminophen (NORCO/VICODIN) 5-325 MG per tablet 1 tablet   pantoprazole (PROTONIX) EC tablet 40 mg   sulfamethoxazole-trimethoprim (BACTRIM DS,SEPTRA DS) 800-160 MG per tablet 1 tablet    Technical Summary: A multichannel digital EEG recording measured by the international 10-20 system with electrodes applied with paste and impedances below 5000 ohms performed in our laboratory with EKG monitoring in an awake and drowsy patient.  Photic stimulation was performed.  Hyperventilation was not performed.  The digital EEG was referentially recorded, reformatted, and digitally filtered in a variety of bipolar and referential montages for optimal display.    Description: The patient is awake and drowsy during the recording.  During maximal wakefulness, there is a symmetric, medium voltage 8 Hz posterior dominant rhythm that attenuates with eye opening.  The record is symmetric.  During drowsiness and sleep, there is an increase in theta slowing of the background.  Photic stimulation did not elicit any abnormalities.  There were no epileptiform discharges or electrographic seizures seen.    EKG lead was unremarkable.  Impression: This awake and drowsy EEG is normal.    Clinical Correlation: A normal EEG does not exclude a clinical diagnosis of epilepsy.  If further clinical questions remain, prolonged EEG may be helpful.  Clinical correlation is advised.   Metta Clines, DO

## 2016-06-26 NOTE — Care Management Note (Signed)
Case Management Note  Patient Details  Name: Whitney Marquez MRN: [id number] Date of Birth: 1923/03/27  Subjective/Objective:                    Action/Plan: Pt discharging back to the Independent part of [facility]. MD placed orders for [facility] PT/OT. CM spoke to the patient and she would like her therapy through [facility]. CM called [facility] therapy department and left them a message. CM will fax the orders to the therapy department at [facility]. Pt daughter is going to transport her home and stay with her the next few days per her report. Bedside RN updated.   Expected Discharge Date:  06/26/16               Expected Discharge Plan:  [place]  In-House Referral:     Discharge planning Services  CM Consult  Post Acute Care Choice:  Home Health Choice offered to:  Patient, Adult Children  DME Arranged:    DME Agency:     HH Arranged:  PT, OT HH Agency:   (Therapy department at [facility])  Status of Service:  Completed, signed off  If discussed at [place] of Stay Meetings, dates discussed:    Additional Comments:  Pollie Friar, RN 06/26/2016, 4:10 PM

## 2016-06-27 ENCOUNTER — Ambulatory Visit (INDEPENDENT_AMBULATORY_CARE_PROVIDER_SITE_OTHER): Payer: Medicare Other | Admitting: Podiatry

## 2016-06-27 ENCOUNTER — Encounter: Payer: Self-pay | Admitting: Podiatry

## 2016-06-27 VITALS — BP 100/53 | HR 81 | Resp 14

## 2016-06-27 DIAGNOSIS — B351 Tinea unguium: Secondary | ICD-10-CM

## 2016-06-27 DIAGNOSIS — M79673 Pain in unspecified foot: Secondary | ICD-10-CM | POA: Diagnosis not present

## 2016-06-27 NOTE — Progress Notes (Signed)
Patient ID: Whitney Marquez, female   DOB: [birth year], 82 y.o.   MRN: [id number] Complaint:  Visit Type: Patient returns to my office for continued preventative foot care services. Complaint: Patient states" my nails have grown long and thick and become painful to walk and wear shoes.. She presents for preventative foot care services. No changes to ROS.  Painful callus under big toe joint right foot.  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  Podiatric Exam: Vascular: dorsalis pedis and posterior tibial pulses are palpable bilateral. Capillary return is immediate. Temperature gradient is WNL. Skin turgor WNL  Sensorium: Normal Semmes Weinstein monofilament test. Normal tactile sensation bilaterally. Nail Exam: Pt has thick disfigured discolored nails with subungual debris noted bilateral entire nail hallux through fifth toenails Ulcer Exam: There is no evidence of ulcer or pre-ulcerative changes or infection. Orthopedic Exam: Muscle tone and strength are WNL. No limitations in general ROM. No crepitus or effusions noted. Foot type and digits show no abnormalities. Bony prominences are unremarkable. Skin: No Porokeratosis. No infection or ulcers.  Asymptomatic callus right foot  Diagnosis:  Tinea unguium, Pain in right toe, pain in left toes  Treatment & Plan Procedures and Treatment: Consent by patient was obtained for treatment procedures. The patient understood the discussion of treatment and procedures well. All questions were answered thoroughly reviewed. Debridement of mycotic and hypertrophic toenails, 1 through 5 bilateral and clearing of subungual debris. No ulceration, no infection noted.  Return Visit-Office Procedure: Patient instructed to return to the office for a follow up visit 3 months   for continued evaluation and treatment.   Gardiner Barefoot DPM

## 2016-06-30 ENCOUNTER — Emergency Department (HOSPITAL_COMMUNITY): Payer: Medicare Other

## 2016-06-30 ENCOUNTER — Emergency Department (HOSPITAL_COMMUNITY)
Admission: EM | Admit: 2016-06-30 | Discharge: 2016-06-30 | Disposition: A | Discharge status: Home / Self Care | Payer: Medicare Other | Attending: Emergency Medicine | Admitting: Emergency Medicine

## 2016-06-30 ENCOUNTER — Encounter (HOSPITAL_COMMUNITY): Payer: Self-pay | Admitting: Emergency Medicine

## 2016-06-30 DIAGNOSIS — Z79899 Other long term (current) drug therapy: Secondary | ICD-10-CM | POA: Insufficient documentation

## 2016-06-30 DIAGNOSIS — Z7982 Long term (current) use of aspirin: Secondary | ICD-10-CM | POA: Insufficient documentation

## 2016-06-30 DIAGNOSIS — Y9301 Activity, walking, marching and hiking: Secondary | ICD-10-CM | POA: Diagnosis not present

## 2016-06-30 DIAGNOSIS — S0990XA Unspecified injury of head, initial encounter: Secondary | ICD-10-CM | POA: Diagnosis present

## 2016-06-30 DIAGNOSIS — Y999 Unspecified external cause status: Secondary | ICD-10-CM | POA: Insufficient documentation

## 2016-06-30 DIAGNOSIS — Z853 Personal history of malignant neoplasm of breast: Secondary | ICD-10-CM | POA: Diagnosis not present

## 2016-06-30 DIAGNOSIS — S01111A Laceration without foreign body of right eyelid and periocular area, initial encounter: Secondary | ICD-10-CM | POA: Diagnosis not present

## 2016-06-30 DIAGNOSIS — Z85038 Personal history of other malignant neoplasm of large intestine: Secondary | ICD-10-CM | POA: Diagnosis not present

## 2016-06-30 DIAGNOSIS — S0181XA Laceration without foreign body of other part of head, initial encounter: Secondary | ICD-10-CM | POA: Diagnosis not present

## 2016-06-30 DIAGNOSIS — I10 Essential (primary) hypertension: Secondary | ICD-10-CM | POA: Insufficient documentation

## 2016-06-30 DIAGNOSIS — Y9289 Other specified places as the place of occurrence of the external cause: Secondary | ICD-10-CM | POA: Insufficient documentation

## 2016-06-30 DIAGNOSIS — W19XXXA Unspecified fall, initial encounter: Secondary | ICD-10-CM

## 2016-06-30 DIAGNOSIS — N390 Urinary tract infection, site not specified: Secondary | ICD-10-CM

## 2016-06-30 DIAGNOSIS — W1839XA Other fall on same level, initial encounter: Secondary | ICD-10-CM | POA: Diagnosis not present

## 2016-06-30 LAB — URINE MICROSCOPIC-ADD ON: RBC / HPF: NONE SEEN RBC/hpf (ref 0–5)

## 2016-06-30 LAB — URINALYSIS, ROUTINE W REFLEX MICROSCOPIC
Bilirubin Urine: NEGATIVE
GLUCOSE, UA: NEGATIVE mg/dL
Hgb urine dipstick: NEGATIVE
Ketones, ur: NEGATIVE mg/dL
Nitrite: NEGATIVE
PH: 7.5 (ref 5.0–8.0)
Protein, ur: NEGATIVE mg/dL
Specific Gravity, Urine: 1.011 (ref 1.005–1.030)

## 2016-06-30 MED ORDER — CIPROFLOXACIN HCL 500 MG PO TABS
500.0000 mg | ORAL_TABLET | Freq: Once | ORAL | Status: AC
  Administered 2016-06-30: 500 mg via ORAL
  Filled 2016-06-30: qty 1

## 2016-06-30 MED ORDER — LIDOCAINE HCL 2 % IJ SOLN
20.0000 mL | Freq: Once | INTRAMUSCULAR | Status: AC
  Administered 2016-06-30: 400 mg
  Filled 2016-06-30: qty 20

## 2016-06-30 MED ORDER — CIPROFLOXACIN HCL 500 MG PO TABS: 500.0000 mg | ORAL_TABLET | Freq: Two times a day (BID) | ORAL | 0 refills | Status: DC

## 2016-06-30 NOTE — ED Triage Notes (Signed)
Per EMS, pt from [facility] fell while walking. Pt denies loss of consciousness, neck, head or back pain. Pt A&Ox4.  Pt has laceration and hematoma above right eye. Pt complains of soreness above right eye. Pt is on Asprin, no other blood thinner.

## 2016-06-30 NOTE — Discharge Instructions (Signed)
Patient with her workup for the fall included a negative head CT and CT of face and neck. Patient with 2 lacerations one in the eyebrow of the right eye in the other on the 4 head. Forehead laceration required suturing and was closed with 3 6-0 Prolene sutures L need to be removed in 5-7 days. That area needs to stay dry for 24 hours. In addition there was evidence of an early urinary tract infection patient will be started on Cipro first dose given here tonight's the next dose will be tomorrow. In addition would recommend that the patient does stay in [facility] for the next 2 nights just observe her for concerns for fall risk. In addition patient also has a some soreness in her right hip area but x-rays were negative.

## 2016-06-30 NOTE — ED Provider Notes (Signed)
[facility] DEPT Provider Note   CSN: [id number] Arrival date & time: 06/30/16  1351     History   Chief Complaint Chief Complaint  Patient presents with  . Fall    HPI Whitney Marquez is a 82 y.o. female.  Patient with unwitnessed fall at [facility]. She was coming out of her apartment in May of hit the door hinge or the door frame. She denies any loss of consciousness. Patient recently treated for urinary tract infection complete antibiotics over a week ago. Patient has had some falls in the past. Family states that many times when she falls it is related to UTI. Patient does not appear to be confused. Tetanus is up-to-date. The fall resulted in.2 lacerations of 1 to the lateral aspect of the right eyebrow 1 to the right forehead.    The patient without any other complaints.  Past Medical History:  Diagnosis Date  . Aortic valve stenosis   . Arthritis   . Cancer [facility])    breast and colon  . Cancer ([place])   . Cataract   . Cataracts, both eyes   . Hypertension   . Plantar keratosis   . Scoliosis     Patient Active Problem List   Diagnosis Date Noted  . Muscle weakness (generalized)   . Confusion 06/24/2016  . CVA (cerebral infarction) 06/24/2016  . UTI (lower urinary tract infection) 06/24/2016  . SIRS (systemic inflammatory response syndrome) ([place]) 10/20/2014  . Acute ischemic colitis ([facility]) 10/19/2014  . Compression fracture of L1 lumbar vertebra with routine healing 10/19/2014  . Hypokalemia   . Anemia 10/15/2014  . Essential hypertension 10/15/2014  . Hyperlipidemia 10/15/2014    Past Surgical History:  Procedure Laterality Date  . BACK SURGERY    . BREAST SURGERY     breast cancer  . COLON SURGERY     colon cancer  . COLON SURGERY    . LUMBAR LAMINECTOMY    . meniscus tear    . TOE AMPUTATION     second toe    OB History    Gravida Para Term Preterm AB Living   0 0 0 0 0     SAB TAB Ectopic Multiple Live Births   0 0 0           Home  Medications    Prior to Admission medications   Medication Sig Start Date End Date Taking? Authorizing Provider  alendronate (FOSAMAX) 70 MG tablet Take 70 mg by mouth every Monday. Take with a full glass of water on an empty stomach.    Yes Historical Provider, MD  amLODipine (NORVASC) 5 MG tablet Take 5 mg by mouth daily.   Yes Historical Provider, MD  aspirin 81 MG tablet Take 81 mg by mouth daily with breakfast.    Yes Historical Provider, MD  Calcium-Vitamin D-Vitamin K 500-100-40 MG-UNT-MCG CHEW Chew 1 tablet by mouth 3 (three) times daily with meals.    Yes Historical Provider, MD  fenofibrate 54 MG tablet Take 54 mg by mouth daily.   Yes Historical Provider, MD  HYDROcodone-acetaminophen (NORCO/VICODIN) 5-325 MG per tablet Take 1 tablet by mouth every 6 (six) hours as needed for moderate pain.   Yes Suella Broad, MD  Multiple Vitamins-Minerals (CENTRUM SILVER PO) Take 1 tablet by mouth daily.   Yes Historical Provider, MD  naproxen sodium (ALEVE) 220 MG tablet Take 220 mg by mouth 2 (two) times daily with a meal.    Yes Historical Provider, MD  omeprazole (  PRILOSEC) 20 MG capsule Take 20 mg by mouth daily.   Yes Historical Provider, MD  Polyvinyl Alcohol-Povidone (REFRESH OP) Apply 1-2 drops to eye at bedtime as needed (for dry eyes).   Yes Historical Provider, MD  sulfamethoxazole-trimethoprim (BACTRIM DS,SEPTRA DS) 800-160 MG tablet Take 1 tablet by mouth every 12 (twelve) hours. 06/26/16  Yes Patrecia Pour, MD  ciprofloxacin (CIPRO) 500 MG tablet Take 1 tablet (500 mg total) by mouth 2 (two) times daily. 06/30/16   Fredia Sorrow, MD    Family History No family history on file.  Social History Social History  Substance Use Topics  . Smoking status: Never Smoker  . Smokeless tobacco: Never Used  . Alcohol use No     Allergies   Cephalosporins; Iodine; Other; Pyridium [phenazopyridine hcl]; Macrobid [nitrofurantoin monohyd macro]; Penicillins; and Shellfish allergy   Review  of Systems Review of Systems  Constitutional: Negative for fever.  HENT: Negative for congestion.   Eyes: Negative for visual disturbance.  Respiratory: Negative for shortness of breath.   Cardiovascular: Negative for chest pain.  Gastrointestinal: Negative for abdominal pain.  Genitourinary: Negative for dysuria.  Musculoskeletal: Negative for back pain and neck pain.  Skin: Positive for wound.  Neurological: Negative for headaches.  Hematological: Does not bruise/bleed easily.  Psychiatric/Behavioral: Negative for confusion.     Physical Exam Updated Vital Signs BP 130/80 (BP Location: Left Arm)   Pulse 85   Temp 98.8 F (37.1 C) (Oral)   Resp 17   Ht 5\' 3"  (1.6 m)   Wt 55.8 kg   SpO2 93%   BMI 21.79 kg/m   Physical Exam  Constitutional: She is oriented to person, place, and time. She appears well-developed and well-nourished. No distress.  HENT:  Head: Normocephalic.  2 cm laceration along the skin lines of the right lateral eyebrow no active bleeding skin is closed. Right for head able laceration measuring about 2 cm. No active bleeding. This laceration is full-thickness. Marked ecchymosis and contusion around the right eye. No evidence of any hyphema. Extraocular muscles are intact.  Eyes: Conjunctivae and EOM are normal. Pupils are equal, round, and reactive to light.  No hyphema  Neck: Normal range of motion. Neck supple.  Cardiovascular: Normal rate, regular rhythm and normal heart sounds.   No murmur heard. Pulmonary/Chest: Effort normal and breath sounds normal. No respiratory distress.  Abdominal: Soft. Bowel sounds are normal. There is no tenderness.  Musculoskeletal: Normal range of motion. She exhibits tenderness.  Tenderness to palpation to the right groin and hip area. Patient with reasonable range of motion.  Neurological: She is alert and oriented to person, place, and time. She displays normal reflexes. No cranial nerve deficit. She exhibits normal  muscle tone.  Some hard of hearing. Patient uses hearing aids but she doesn't have him on.  Skin: Skin is warm.  Nursing note and vitals reviewed.    ED Treatments / Results  Labs (all labs ordered are listed, but only abnormal results are displayed) Labs Reviewed  URINALYSIS, ROUTINE W REFLEX MICROSCOPIC (NOT AT [facility]) - Abnormal; Notable for the following:       Result Value   APPearance CLOUDY (*)    Leukocytes, UA MODERATE (*)    All other components within normal limits  URINE MICROSCOPIC-ADD ON - Abnormal; Notable for the following:    Squamous Epithelial / LPF 0-5 (*)    Bacteria, UA MANY (*)    All other components within normal limits  URINE CULTURE  EKG  EKG Interpretation None       Radiology Ct Head Wo Contrast  Result Date: 06/30/2016 CLINICAL DATA:  Status post fall today while walking. Hematoma above the right eye. Initial encounter. EXAM: CT HEAD WITHOUT CONTRAST CT MAXILLOFACIAL WITHOUT CONTRAST CT CERVICAL SPINE WITHOUT CONTRAST TECHNIQUE: Multidetector CT imaging of the head, cervical spine, and maxillofacial structures were performed using the standard protocol without intravenous contrast. Multiplanar CT image reconstructions of the cervical spine and maxillofacial structures were also generated. COMPARISON:  CT head CT scan 06/24/2016.  Brain MRI 06/25/2016. FINDINGS: CT HEAD FINDINGS Brain: There is cortical atrophy and chronic microvascular ischemic change. No evidence of acute intracranial abnormality including hemorrhage, infarct, mass lesion, mass effect, midline shift or abnormal extra-axial fluid collection. No hydrocephalus or pneumocephalus. Vascular: Extensive atherosclerosis is seen. Skull: Intact. Sinuses/Orbits: See below. Other: None. CT MAXILLOFACIAL FINDINGS Osseous: No fracture. Mandibular condyles are located with marked degenerative change seen bilaterally. Orbits: Unremarkable. Sinuses: Clear. Soft tissues: Hematoma is seen about the right  eye. Limited intracranial: See above. CT CERVICAL SPINE FINDINGS Alignment: 0.3 cm anterolisthesis C3 on C4 and C4 on C5 due to facet arthropathy is identified. There is also trace anterolisthesis C7 on T1. Skull base and vertebrae: No fracture Soft tissues and spinal canal: No epidural hematoma is identified. Carotid atherosclerosis is noted. Disc levels: There is marked loss of disc space height at C5-6 and C6-7. Milder degree of degenerative disc disease is noted at C4-5. Upper chest: Single small calcified pleural plaque in the right apex is noted. Otherwise unremarkable. Other: None. IMPRESSION: Contusion about the right eye. No other acute abnormality head, face or cervical spine. Extensive atrophy and chronic microvascular ischemic change. Multilevel cervical spondylosis. Atherosclerosis. Electronically Signed   By: Inge Rise M.D.   On: 06/30/2016 16:08   Ct Cervical Spine Wo Contrast  Result Date: 06/30/2016 CLINICAL DATA:  Status post fall today while walking. Hematoma above the right eye. Initial encounter. EXAM: CT HEAD WITHOUT CONTRAST CT MAXILLOFACIAL WITHOUT CONTRAST CT CERVICAL SPINE WITHOUT CONTRAST TECHNIQUE: Multidetector CT imaging of the head, cervical spine, and maxillofacial structures were performed using the standard protocol without intravenous contrast. Multiplanar CT image reconstructions of the cervical spine and maxillofacial structures were also generated. COMPARISON:  CT head CT scan 06/24/2016.  Brain MRI 06/25/2016. FINDINGS: CT HEAD FINDINGS Brain: There is cortical atrophy and chronic microvascular ischemic change. No evidence of acute intracranial abnormality including hemorrhage, infarct, mass lesion, mass effect, midline shift or abnormal extra-axial fluid collection. No hydrocephalus or pneumocephalus. Vascular: Extensive atherosclerosis is seen. Skull: Intact. Sinuses/Orbits: See below. Other: None. CT MAXILLOFACIAL FINDINGS Osseous: No fracture. Mandibular condyles  are located with marked degenerative change seen bilaterally. Orbits: Unremarkable. Sinuses: Clear. Soft tissues: Hematoma is seen about the right eye. Limited intracranial: See above. CT CERVICAL SPINE FINDINGS Alignment: 0.3 cm anterolisthesis C3 on C4 and C4 on C5 due to facet arthropathy is identified. There is also trace anterolisthesis C7 on T1. Skull base and vertebrae: No fracture Soft tissues and spinal canal: No epidural hematoma is identified. Carotid atherosclerosis is noted. Disc levels: There is marked loss of disc space height at C5-6 and C6-7. Milder degree of degenerative disc disease is noted at C4-5. Upper chest: Single small calcified pleural plaque in the right apex is noted. Otherwise unremarkable. Other: None. IMPRESSION: Contusion about the right eye. No other acute abnormality head, face or cervical spine. Extensive atrophy and chronic microvascular ischemic change. Multilevel cervical spondylosis. Atherosclerosis. Electronically Signed  By: Inge Rise M.D.   On: 06/30/2016 16:08   Dg Hip Unilat With Pelvis 2-3 Views Right  Result Date: 06/30/2016 CLINICAL DATA:  Fall with right anterior hip pain. Initial encounter. EXAM: DG HIP (WITH OR WITHOUT PELVIS) 2-3V RIGHT COMPARISON:  None. FINDINGS: No evidence of pelvic ring fracture or diastasis. Both hips are located and appear intact. Osteopenia. Advanced lumbar disc and facet degeneration with partly seen dextroscoliosis. IMPRESSION: No acute finding. Electronically Signed   By: Monte Fantasia M.D.   On: 06/30/2016 16:04   Ct Maxillofacial Wo Cm  Result Date: 06/30/2016 CLINICAL DATA:  Status post fall today while walking. Hematoma above the right eye. Initial encounter. EXAM: CT HEAD WITHOUT CONTRAST CT MAXILLOFACIAL WITHOUT CONTRAST CT CERVICAL SPINE WITHOUT CONTRAST TECHNIQUE: Multidetector CT imaging of the head, cervical spine, and maxillofacial structures were performed using the standard protocol without intravenous  contrast. Multiplanar CT image reconstructions of the cervical spine and maxillofacial structures were also generated. COMPARISON:  CT head CT scan 06/24/2016.  Brain MRI 06/25/2016. FINDINGS: CT HEAD FINDINGS Brain: There is cortical atrophy and chronic microvascular ischemic change. No evidence of acute intracranial abnormality including hemorrhage, infarct, mass lesion, mass effect, midline shift or abnormal extra-axial fluid collection. No hydrocephalus or pneumocephalus. Vascular: Extensive atherosclerosis is seen. Skull: Intact. Sinuses/Orbits: See below. Other: None. CT MAXILLOFACIAL FINDINGS Osseous: No fracture. Mandibular condyles are located with marked degenerative change seen bilaterally. Orbits: Unremarkable. Sinuses: Clear. Soft tissues: Hematoma is seen about the right eye. Limited intracranial: See above. CT CERVICAL SPINE FINDINGS Alignment: 0.3 cm anterolisthesis C3 on C4 and C4 on C5 due to facet arthropathy is identified. There is also trace anterolisthesis C7 on T1. Skull base and vertebrae: No fracture Soft tissues and spinal canal: No epidural hematoma is identified. Carotid atherosclerosis is noted. Disc levels: There is marked loss of disc space height at C5-6 and C6-7. Milder degree of degenerative disc disease is noted at C4-5. Upper chest: Single small calcified pleural plaque in the right apex is noted. Otherwise unremarkable. Other: None. IMPRESSION: Contusion about the right eye. No other acute abnormality head, face or cervical spine. Extensive atrophy and chronic microvascular ischemic change. Multilevel cervical spondylosis. Atherosclerosis. Electronically Signed   By: Inge Rise M.D.   On: 06/30/2016 16:08    Procedures Procedures (including critical care time)  LACERATION REPAIR Performed by: Fredia Sorrow Authorized by: Fredia Sorrow Consent: Verbal consent obtained. Risks and benefits: risks, benefits and alternatives were discussed Consent given by:  patient Patient identity confirmed: provided demographic data Prepped and Draped in normal sterile fashion Wound explored  Laceration Location: 2 cm full-thickness right forehead laceration  Laceration Length: 2 cm  No Foreign Bodies seen or palpated  Anesthesia: local infiltration  Local anesthetic: lidocaine 2% % without epinephrine  Anesthetic total: 1 ml  Irrigation method: syringe Amount of cleaning: standard  Skin closure: Simple interrupted 6-0 Prolene   Number of sutures: 3   Technique: Simple interrupted good closure.   Patient tolerance: Patient tolerated the procedure well with no immediate complications.   Medications Ordered in ED Medications  lidocaine (XYLOCAINE) 2 % (with pres) injection 400 mg (400 mg Infiltration Given 06/30/16 1745)  ciprofloxacin (CIPRO) tablet 500 mg (500 mg Oral Given 06/30/16 1746)     Initial Impression / Assessment and Plan / ED Course  I have reviewed the triage vital signs and the nursing notes.  Pertinent labs & imaging results that were available during my care of the patient were reviewed by  me and considered in my medical decision making (see chart for details).  Clinical Course    Patient status post fall at [facility]. Unwitnessed refill the patient probably hit door frame. Patient reports no loss of consciousness but is not clear. Patient had bleeding from her right forehead and right eyebrow area as well as marked the contusion and ecchymosis around the right eye. No evidence of any hyphema. CT of head neck and face without any bony or brain injuries. Family members raise concerns that sometimes when she falls she has a urinary tract infection. Urinalysis is consistent perhaps with an early UTI. Patient was treated about a week ago for urinary tract infection. Culture sent patient will be started on Cipro.  The eyebrow laceration did not require suturing it was already closed. Laceration on the forehead which measures  about 2 cm was closed with 3 6-0 Prolene sutures. Patient received 1 mL of 2% lidocaine. Patient tolerated procedure well.  Patient given her first dose of Cipro here and will be continued on Cipro for the next 5 days. As stated urine culture is pending. Recommending the patient stay in the [facility] at [facility] for the next 2 nights just that she can be observed. She is still fall risk. Patient also with complaint of some right groin and right hip pain area x-rays of that area negative for any bony injuries to the pelvis or hip.  Final Clinical Impressions(s) / ED Diagnoses   Final diagnoses:  Fall, initial encounter  Head injury, initial encounter  Facial laceration, initial encounter  UTI (lower urinary tract infection)    New Prescriptions New Prescriptions   CIPROFLOXACIN (CIPRO) 500 MG TABLET    Take 1 tablet (500 mg total) by mouth 2 (two) times daily.     Fredia Sorrow, MD 06/30/16 1755

## 2016-07-02 ENCOUNTER — Encounter (HOSPITAL_COMMUNITY): Payer: Self-pay | Admitting: Emergency Medicine

## 2016-07-02 ENCOUNTER — Inpatient Hospital Stay (HOSPITAL_COMMUNITY)
Admission: EM | Admit: 2016-07-02 | Discharge: 2016-07-06 | DRG: 092 | Disposition: A | Discharge status: Skilled Nursing Facility | Payer: Medicare Other | Attending: Internal Medicine | Admitting: Internal Medicine

## 2016-07-02 ENCOUNTER — Emergency Department (HOSPITAL_COMMUNITY): Payer: Medicare Other

## 2016-07-02 DIAGNOSIS — G9341 Metabolic encephalopathy: Secondary | ICD-10-CM

## 2016-07-02 DIAGNOSIS — T368X5A Adverse effect of other systemic antibiotics, initial encounter: Secondary | ICD-10-CM | POA: Diagnosis present

## 2016-07-02 DIAGNOSIS — R29898 Other symptoms and signs involving the musculoskeletal system: Secondary | ICD-10-CM

## 2016-07-02 DIAGNOSIS — I10 Essential (primary) hypertension: Secondary | ICD-10-CM | POA: Diagnosis present

## 2016-07-02 DIAGNOSIS — M79604 Pain in right leg: Secondary | ICD-10-CM

## 2016-07-02 DIAGNOSIS — I350 Nonrheumatic aortic (valve) stenosis: Secondary | ICD-10-CM

## 2016-07-02 DIAGNOSIS — R4701 Aphasia: Secondary | ICD-10-CM | POA: Diagnosis not present

## 2016-07-02 DIAGNOSIS — Z853 Personal history of malignant neoplasm of breast: Secondary | ICD-10-CM

## 2016-07-02 DIAGNOSIS — R9401 Abnormal electroencephalogram [EEG]: Secondary | ICD-10-CM | POA: Diagnosis present

## 2016-07-02 DIAGNOSIS — R262 Difficulty in walking, not elsewhere classified: Secondary | ICD-10-CM

## 2016-07-02 DIAGNOSIS — G92 Toxic encephalopathy: Secondary | ICD-10-CM | POA: Diagnosis not present

## 2016-07-02 DIAGNOSIS — Z85038 Personal history of other malignant neoplasm of large intestine: Secondary | ICD-10-CM

## 2016-07-02 DIAGNOSIS — R531 Weakness: Secondary | ICD-10-CM

## 2016-07-02 DIAGNOSIS — W19XXXD Unspecified fall, subsequent encounter: Secondary | ICD-10-CM | POA: Diagnosis present

## 2016-07-02 DIAGNOSIS — R4781 Slurred speech: Secondary | ICD-10-CM

## 2016-07-02 DIAGNOSIS — M25551 Pain in right hip: Secondary | ICD-10-CM

## 2016-07-02 DIAGNOSIS — R7989 Other specified abnormal findings of blood chemistry: Secondary | ICD-10-CM

## 2016-07-02 DIAGNOSIS — M419 Scoliosis, unspecified: Secondary | ICD-10-CM | POA: Diagnosis present

## 2016-07-02 DIAGNOSIS — E860 Dehydration: Secondary | ICD-10-CM

## 2016-07-02 DIAGNOSIS — Y92009 Unspecified place in unspecified non-institutional (private) residence as the place of occurrence of the external cause: Secondary | ICD-10-CM

## 2016-07-02 DIAGNOSIS — M1990 Unspecified osteoarthritis, unspecified site: Secondary | ICD-10-CM | POA: Diagnosis present

## 2016-07-02 DIAGNOSIS — Z9181 History of falling: Secondary | ICD-10-CM

## 2016-07-02 DIAGNOSIS — E785 Hyperlipidemia, unspecified: Secondary | ICD-10-CM | POA: Diagnosis present

## 2016-07-02 DIAGNOSIS — E876 Hypokalemia: Secondary | ICD-10-CM | POA: Diagnosis present

## 2016-07-02 DIAGNOSIS — R410 Disorientation, unspecified: Secondary | ICD-10-CM | POA: Diagnosis present

## 2016-07-02 DIAGNOSIS — N390 Urinary tract infection, site not specified: Secondary | ICD-10-CM | POA: Diagnosis present

## 2016-07-02 DIAGNOSIS — Z66 Do not resuscitate: Secondary | ICD-10-CM | POA: Diagnosis present

## 2016-07-02 DIAGNOSIS — S01111D Laceration without foreign body of right eyelid and periocular area, subsequent encounter: Secondary | ICD-10-CM

## 2016-07-02 LAB — URINALYSIS, ROUTINE W REFLEX MICROSCOPIC
BILIRUBIN URINE: NEGATIVE
Glucose, UA: NEGATIVE mg/dL
Ketones, ur: NEGATIVE mg/dL
Nitrite: NEGATIVE
PH: 7.5 (ref 5.0–8.0)
Protein, ur: NEGATIVE mg/dL
SPECIFIC GRAVITY, URINE: 1.015 (ref 1.005–1.030)

## 2016-07-02 LAB — CBC WITH DIFFERENTIAL/PLATELET
BASOS PCT: 0 %
Basophils Absolute: 0 10*3/uL (ref 0.0–0.1)
EOS ABS: 0.2 10*3/uL (ref 0.0–0.7)
EOS PCT: 2 %
HCT: 36.2 % (ref 36.0–46.0)
Hemoglobin: 11.8 g/dL — ABNORMAL LOW (ref 12.0–15.0)
LYMPHS ABS: 1.8 10*3/uL (ref 0.7–4.0)
Lymphocytes Relative: 16 %
MCH: 28.6 pg (ref 26.0–34.0)
MCHC: 32.6 g/dL (ref 30.0–36.0)
MCV: 87.9 fL (ref 78.0–100.0)
MONOS PCT: 13 %
Monocytes Absolute: 1.5 10*3/uL — ABNORMAL HIGH (ref 0.1–1.0)
Neutro Abs: 7.9 10*3/uL — ABNORMAL HIGH (ref 1.7–7.7)
Neutrophils Relative %: 69 %
PLATELETS: 268 10*3/uL (ref 150–400)
RBC: 4.12 MIL/uL (ref 3.87–5.11)
RDW: 13.8 % (ref 11.5–15.5)
WBC: 11.5 10*3/uL — ABNORMAL HIGH (ref 4.0–10.5)

## 2016-07-02 LAB — RAPID URINE DRUG SCREEN, HOSP PERFORMED
Amphetamines: NOT DETECTED
BARBITURATES: NOT DETECTED
BENZODIAZEPINES: NOT DETECTED
COCAINE: NOT DETECTED
OPIATES: NOT DETECTED
Tetrahydrocannabinol: NOT DETECTED

## 2016-07-02 LAB — PROTIME-INR
INR: 1.01
PROTHROMBIN TIME: 13.3 s (ref 11.4–15.2)

## 2016-07-02 LAB — I-STAT TROPONIN, ED: Troponin i, poc: 0.01 ng/mL (ref 0.00–0.08)

## 2016-07-02 LAB — COMPREHENSIVE METABOLIC PANEL
ALBUMIN: 3.5 g/dL (ref 3.5–5.0)
ALT: 17 U/L (ref 14–54)
ANION GAP: 10 (ref 5–15)
AST: 23 U/L (ref 15–41)
Alkaline Phosphatase: 42 U/L (ref 38–126)
BUN: 7 mg/dL (ref 6–20)
CALCIUM: 9.2 mg/dL (ref 8.9–10.3)
CHLORIDE: 97 mmol/L — AB (ref 101–111)
CO2: 23 mmol/L (ref 22–32)
Creatinine, Ser: 0.48 mg/dL (ref 0.44–1.00)
GFR calc non Af Amer: >60 mL/min (ref >60)
Glucose, Bld: 101 mg/dL — ABNORMAL HIGH (ref 65–99)
POTASSIUM: 3.6 mmol/L (ref 3.5–5.1)
SODIUM: 130 mmol/L — AB (ref 135–145)
Total Bilirubin: 0.6 mg/dL (ref 0.3–1.2)
Total Protein: 6.2 g/dL — ABNORMAL LOW (ref 6.5–8.1)

## 2016-07-02 LAB — URINE MICROSCOPIC-ADD ON

## 2016-07-02 LAB — URINE CULTURE: Culture: <10000 — AB

## 2016-07-02 LAB — TSH: TSH: 4.127 u[IU]/mL (ref 0.350–4.500)

## 2016-07-02 LAB — D-DIMER, QUANTITATIVE: D-Dimer, Quant: 1.02 ug/mL-FEU — ABNORMAL HIGH (ref 0.00–0.50)

## 2016-07-02 LAB — APTT: APTT: 28 s (ref 24–36)

## 2016-07-02 LAB — LIPASE, BLOOD: Lipase: 40 U/L (ref 11–51)

## 2016-07-02 LAB — I-STAT CG4 LACTIC ACID, ED: LACTIC ACID, VENOUS: 0.91 mmol/L (ref 0.5–1.9)

## 2016-07-02 MED ORDER — SODIUM CHLORIDE 0.9 % IV BOLUS (SEPSIS)
1000.0000 mL | Freq: Once | INTRAVENOUS | Status: AC
  Administered 2016-07-02: 1000 mL via INTRAVENOUS

## 2016-07-02 NOTE — ED Provider Notes (Signed)
[facility] DEPT Provider Note   CSN: [id number] Arrival date & time: 07/02/16  1848     History   Chief Complaint Chief Complaint  Patient presents with  . Aphasia    HPI Whitney Marquez is a 82 y.o. female with a past medical history significant for breast and colon cancer, aortic valve stenosis, hypertension, recent fall and recent urinary tract infection currently on Cipro who presents with expressive aphasia, slurred speech, and confusion. Patient is accompanied by daughter who reports that patient does not have a definite time of known last normal. They report that the patient was diagnosed with a urinary tract infection several days ago after she had a fall. Patient had a CT scan at that time which did not show any acute intracranial injury. She had laceration repaired on her right for head. Patient found to have UTI and was started on Cipro. Patient discharged back to her facility however, today, family noticed that patient was acting differently. Patient was having difficulty speaking and was sounding confused. Patient was reassessed by the same nurse that she had seen her the day before and there was concern for neurologic changes. Patient also was found to have right leg weakness which is new.  Code stroke was not activated as there is no time of last normal known.  Family also reports that patient was complaining of "bugs on the ceiling". Patient has no history of hallucinations.  Patient denies any fevers, chills, chest pain, shortness of breath, nausea, vomiting, constipation, diarrhea, dysuria. Patient has no complaints aside from the speech difficulty, some confusion and weakness.    The history is provided by the patient and a relative (daughter). The history is limited by the condition of the patient. No language interpreter was used.  Neurologic Problem  This is a new problem. Episode onset: unsure of onset. The problem occurs constantly. The problem has not changed  since onset.Pertinent negatives include no chest pain, no abdominal pain, no headaches and no shortness of breath. Nothing aggravates the symptoms. Nothing relieves the symptoms. She has tried nothing for the symptoms. The treatment provided no relief.    Past Medical History:  Diagnosis Date  . Aortic valve stenosis   . Arthritis   . Cancer [facility])    breast and colon  . Cancer ([place])   . Cataract   . Cataracts, both eyes   . Hypertension   . Plantar keratosis   . Scoliosis     Patient Active Problem List   Diagnosis Date Noted  . Muscle weakness (generalized)   . Confusion 06/24/2016  . CVA (cerebral infarction) 06/24/2016  . UTI (lower urinary tract infection) 06/24/2016  . SIRS (systemic inflammatory response syndrome) ([place]) 10/20/2014  . Acute ischemic colitis ([place]) 10/19/2014  . Compression fracture of L1 lumbar vertebra with routine healing 10/19/2014  . Hypokalemia   . Anemia 10/15/2014  . Essential hypertension 10/15/2014  . Hyperlipidemia 10/15/2014    Past Surgical History:  Procedure Laterality Date  . BACK SURGERY    . BREAST SURGERY     breast cancer  . COLON SURGERY     colon cancer  . COLON SURGERY    . LUMBAR LAMINECTOMY    . meniscus tear    . TOE AMPUTATION     second toe    OB History    Gravida Para Term Preterm AB Living   0 0 0 0 0     SAB TAB Ectopic Multiple Live Births   0  0 0           Home Medications    Prior to Admission medications   Medication Sig Start Date End Date Taking? Authorizing Provider  alendronate (FOSAMAX) 70 MG tablet Take 70 mg by mouth every Monday. Take with a full glass of water on an empty stomach.     Historical Provider, MD  amLODipine (NORVASC) 5 MG tablet Take 5 mg by mouth daily.    Historical Provider, MD  aspirin 81 MG tablet Take 81 mg by mouth daily with breakfast.     Historical Provider, MD  Calcium-Vitamin D-Vitamin K 500-100-40 MG-UNT-MCG CHEW Chew 1 tablet by mouth 3 (three) times daily with  meals.     Historical Provider, MD  ciprofloxacin (CIPRO) 500 MG tablet Take 1 tablet (500 mg total) by mouth 2 (two) times daily. 06/30/16   Fredia Sorrow, MD  fenofibrate 54 MG tablet Take 54 mg by mouth daily.    Historical Provider, MD  HYDROcodone-acetaminophen (NORCO/VICODIN) 5-325 MG per tablet Take 1 tablet by mouth every 6 (six) hours as needed for moderate pain.    Suella Broad, MD  Multiple Vitamins-Minerals (CENTRUM SILVER PO) Take 1 tablet by mouth daily.    Historical Provider, MD  naproxen sodium (ALEVE) 220 MG tablet Take 220 mg by mouth 2 (two) times daily with a meal.     Historical Provider, MD  omeprazole (PRILOSEC) 20 MG capsule Take 20 mg by mouth daily.    Historical Provider, MD  Polyvinyl Alcohol-Povidone (REFRESH OP) Apply 1-2 drops to eye at bedtime as needed (for dry eyes).    Historical Provider, MD  sulfamethoxazole-trimethoprim (BACTRIM DS,SEPTRA DS) 800-160 MG tablet Take 1 tablet by mouth every 12 (twelve) hours. 06/26/16   Patrecia Pour, MD    Family History No family history on file.  Social History Social History  Substance Use Topics  . Smoking status: Never Smoker  . Smokeless tobacco: Never Used  . Alcohol use No     Allergies   Cephalosporins; Iodine; Other; Pyridium [phenazopyridine hcl]; Macrobid [nitrofurantoin monohyd macro]; Penicillins; and Shellfish allergy   Review of Systems Review of Systems  Unable to perform ROS: Mental status change  Constitutional: Negative for chills, fatigue and fever.  HENT: Negative for congestion and rhinorrhea.   Eyes: Negative for visual disturbance.  Respiratory: Negative for cough, chest tightness, shortness of breath, wheezing and stridor.   Cardiovascular: Negative for chest pain and palpitations.  Gastrointestinal: Negative for abdominal pain, diarrhea, nausea and rectal pain.  Genitourinary: Negative for flank pain and frequency.  Musculoskeletal: Negative for back pain, neck pain and neck  stiffness.  Skin: Positive for wound. Negative for rash.  Neurological: Positive for speech difficulty and weakness. Negative for dizziness, light-headedness, numbness and headaches.  Psychiatric/Behavioral: Positive for confusion.  All other systems reviewed and are negative.    Physical Exam Updated Vital Signs BP 146/91 (BP Location: Right Arm)   Pulse 105   Temp 97.9 F (36.6 C) (Oral)   Resp 15   SpO2 99%   Physical Exam  Constitutional: She appears well-developed and well-nourished. No distress.  HENT:  Right Ear: External ear normal.  Left Ear: External ear normal.  Mouth/Throat: Oropharynx is clear and moist. No oropharyngeal exudate.  Eyes: Conjunctivae and EOM are normal. Pupils are equal, round, and reactive to light. No scleral icterus.  Neck: Normal range of motion.  Cardiovascular: Normal heart sounds and intact distal pulses.  Tachycardia present.   Pulmonary/Chest: Effort normal  and breath sounds normal. No stridor. No respiratory distress. She has no wheezes. She exhibits no tenderness.  Abdominal: Soft. Bowel sounds are normal. There is no tenderness.  Musculoskeletal: She exhibits no tenderness.  Neurological: She is alert. She is disoriented. No cranial nerve deficit or sensory deficit. She exhibits abnormal muscle tone. Coordination normal. GCS eye subscore is 4. GCS verbal subscore is 5. GCS motor subscore is 6.  Patient occasionally says nonrelevant comments. Patient has some slurred speech. Patient has expressive aphasia.  Patient has right leg weakness with leg raise. Symmetric grip strength. Normal pulses in all extremities. Coordination and finger-nose-finger bilaterally.  Skin: Capillary refill takes less than 2 seconds. She is not diaphoretic. No erythema.  Psychiatric: Her mood appears not anxious. She is not actively hallucinating.  Patient not currently hallucinating but before I spoke with her, patient told daughter that she was seen bugs on the  ceiling.  Nursing note and vitals reviewed.    ED Treatments / Results  Labs (all labs ordered are listed, but only abnormal results are displayed) Labs Reviewed  CBC WITH DIFFERENTIAL/PLATELET - Abnormal; Notable for the following:       Result Value   WBC 11.5 (*)    Hemoglobin 11.8 (*)    Neutro Abs 7.9 (*)    Monocytes Absolute 1.5 (*)    All other components within normal limits  COMPREHENSIVE METABOLIC PANEL - Abnormal; Notable for the following:    Sodium 130 (*)    Chloride 97 (*)    Glucose, Bld 101 (*)    Total Protein 6.2 (*)    All other components within normal limits  URINALYSIS, ROUTINE W REFLEX MICROSCOPIC (NOT AT [facility]) - Abnormal; Notable for the following:    APPearance CLOUDY (*)    Hgb urine dipstick TRACE (*)    Leukocytes, UA MODERATE (*)    All other components within normal limits  D-DIMER, QUANTITATIVE (NOT AT [facility]) - Abnormal; Notable for the following:    D-Dimer, Quant 1.02 (*)    All other components within normal limits  URINE MICROSCOPIC-ADD ON - Abnormal; Notable for the following:    Squamous Epithelial / LPF 0-5 (*)    Bacteria, UA FEW (*)    All other components within normal limits  TSH  LIPASE, BLOOD  PROTIME-INR  URINE RAPID DRUG SCREEN, HOSP PERFORMED  APTT  I-STAT CG4 LACTIC ACID, ED  I-STAT TROPOININ, ED    EKG  EKG Interpretation None       Radiology Dg Chest 2 View  Result Date: 07/02/2016 CLINICAL DATA:  Fall 2 days ago with chest pain, initial encounter EXAM: CHEST  2 VIEW COMPARISON:  05/17/2016 FINDINGS: Cardiac shadow is stable. Aortic calcifications are again seen. Elevation the right hemidiaphragm is noted. Some mild interstitial changes are again seen and stable. No pneumothorax or focal infiltrate is seen. Bony structures show no acute abnormality. IMPRESSION: No active cardiopulmonary disease. Electronically Signed   By: Inez Catalina M.D.   On: 07/02/2016 19:57   Ct Head Wo Contrast  Result Date:  07/02/2016 CLINICAL DATA:  Right-sided bruising after falling 2 days ago. Slurred speech. Generalized weakness. EXAM: CT HEAD WITHOUT CONTRAST TECHNIQUE: Contiguous axial images were obtained from the base of the skull through the vertex without intravenous contrast. COMPARISON:  Head CT 06/30/2016 and 06/24/2016. MRI brain 06/25/2016. FINDINGS: Despite efforts by the technologist and patient, motion artifact is present on today's exam and could not be eliminated. This reduces exam sensitivity and specificity.  Brain: There is no evidence of acute intracranial hemorrhage, mass lesion, brain edema or new extra-axial fluid collection. There is stable diffuse prominence of the subarachnoid spaces, especially over the frontal lobes. No hydrocephalus. There is no CT evidence of acute cortical infarction. Stable chronic small vessel ischemic changes in the periventricular white matter. Vascular: Extensive intracranial vascular calcifications are again noted. Skull: Negative for fracture or focal lesion. Sinuses/Orbits: Periorbital swelling on the right appears improved. The visualized paranasal sinuses, mastoid air cells and middle ears are clear. Other: None. IMPRESSION: 1. Interval improvement in periorbital swelling/hematoma on the right. 2. No acute intracranial findings. 3. Stable atrophy and chronic small vessel ischemic changes. Electronically Signed   By: Richardean Sale M.D.   On: 07/02/2016 20:29   Mr Brain Ltd W/o Cm  Result Date: 07/03/2016 CLINICAL DATA:  82 y/o F; slurred speech, a aphasia, and right leg weakness. EXAM: MRI HEAD WITHOUT CONTRAST TECHNIQUE: Sagittal T1, axial DWI, and coronal DWI sequences were acquired. The patient was unable to continue the exam. COMPARISON:  07/02/2016 CT head.  06/25/2016 MR head. FINDINGS: Motion degraded sagittal T1 and axial DWI sequences. No diffusion signal abnormality is identified. Hyperostosis frontalis interna. Moderate parenchymal volume loss. T1 hypo intense  foci in subcortical and periventricular white matter as well as within the if corpus callosum are consistent with chronic microvascular ischemic changes and small lacunar infarcts. Mucous thickening within the sphenoid sinus. IMPRESSION: No diffusion signal abnormality to suggest acute/early subacute infarct. Electronically Signed   By: Kristine Garbe M.D.   On: 07/03/2016 00:22    Procedures Procedures (including critical care time)  Medications Ordered in ED Medications  sodium chloride 0.9 % bolus 1,000 mL (0 mLs Intravenous Stopped 07/02/16 2241)     Initial Impression / Assessment and Plan / ED Course  I have reviewed the triage vital signs and the nursing notes.  Pertinent labs & imaging results that were available during my care of the patient were reviewed by me and considered in my medical decision making (see chart for details).  Clinical Course  Value Comment By Time  Tetrahydrocannabinol: NONE DETECTED (Reviewed) Gwenyth Allegra Deone Leifheit, MD 10/02 0021    Whitney Marquez is a 82 y.o. female with a past medical history significant for breast and colon cancer, aortic valve stenosis, hypertension, recent fall and recent urinary tract infection currently on Cipro who presents with expressive aphasia, slurred speech, and confusion. History and exam are seen above.  Given patient's recent fall and neurologic deficit, concern for possible intracranial hemorrhage or injury. CT of the head was ordered upon arrival and no acute intracranial abnormality was appreciated. There was interval improvement in the swelling/hematoma on the periorbital area.    Given patient's persistent speech difficulty and the weakness, MRI was ordered to look for stroke. Given patient's confusion, recent infection, and new antibiotic use, suspect delirium versus encephalopathy secondary to her infection and medication use.   Patient had no nuchal rigidity or neck pain. Doubt meningitis. ST x-ray was  obtained and showed no cardiopulmonary disease.  Laboratory testing results showed normal lactic acid, normal INR, normal TSH, negative troponin, unremarkable UDS, lipase, and d-dimer 1.02. This was slightly elevated.  Patient's CMP showed hyponatremia of 1:30 which is worse than prior. Kidney function was otherwise normal. CBC showed a mild leukocytosis of 11.5 however, this is improved from prior. Urinalysis showed few bacteria with some squamous epithelial cells. There was moderate leukocytes but no nitrites.  MRI returned showing no evidence  of acute stroke. Patient was reassessed and continued to have some speech difficulties.  Given patient's concern for acute delirium versus encephalopathy related to her recent infection, patient will require admission for further management. As delirium from medications is being considered, will defer to admitting team for choice of antibiotic given patient's allergy profile and UTI.   Final Clinical Impressions(s) / ED Diagnoses   Final diagnoses:  Aphasia  Delirium  Weakness    New Prescriptions New Prescriptions   No medications on file    Clinical Impression: 1. Delirium   2. Aphasia   3. Slurred speech   4. Right leg weakness   5. Weakness     Disposition: Admit to [place], MD 07/03/16 1427

## 2016-07-02 NOTE — ED Notes (Signed)
Dr. Tegeler at bedside.  

## 2016-07-02 NOTE — ED Notes (Signed)
Pt came back from CT with a hearing aid in the left in and not the right ear. Spoke to [employer] They removed the hearing aid from the right ear and placed it in her left ear b/c they stated the Blue goes in the left ear. Daughter Barnetta Chapel took the blue hearing aid out of the left ear and is going to take it home and follow up with Nursing Home to find the 2nd hearing aid.

## 2016-07-02 NOTE — ED Triage Notes (Signed)
Report from [facility]> pt fell on Friday and has ecchymosis to R side of head, face, eye, and neck.  Pt lives alone and family noticed around 3pm today she is slurring "some words."  Unsure last known well.  EMS reports pt alert and oriented.  C/o chronic R knee pain, generalized weakness, and taking medications for UTI.  Pt alert to person and place.  States,  "It is September, no it is October 3rd."  Pt is occasionally slurring some words but is able to be understood.

## 2016-07-02 NOTE — ED Notes (Signed)
Patient transported to MRI 

## 2016-07-02 NOTE — ED Notes (Signed)
Daughter at bedside.

## 2016-07-03 DIAGNOSIS — E860 Dehydration: Secondary | ICD-10-CM | POA: Diagnosis not present

## 2016-07-03 DIAGNOSIS — Z9181 History of falling: Secondary | ICD-10-CM

## 2016-07-03 DIAGNOSIS — G9341 Metabolic encephalopathy: Secondary | ICD-10-CM

## 2016-07-03 DIAGNOSIS — M25551 Pain in right hip: Secondary | ICD-10-CM

## 2016-07-03 DIAGNOSIS — N390 Urinary tract infection, site not specified: Secondary | ICD-10-CM

## 2016-07-03 DIAGNOSIS — R410 Disorientation, unspecified: Secondary | ICD-10-CM | POA: Diagnosis present

## 2016-07-03 DIAGNOSIS — R262 Difficulty in walking, not elsewhere classified: Secondary | ICD-10-CM | POA: Insufficient documentation

## 2016-07-03 DIAGNOSIS — R7989 Other specified abnormal findings of blood chemistry: Secondary | ICD-10-CM

## 2016-07-03 DIAGNOSIS — I350 Nonrheumatic aortic (valve) stenosis: Secondary | ICD-10-CM

## 2016-07-03 DIAGNOSIS — I10 Essential (primary) hypertension: Secondary | ICD-10-CM | POA: Diagnosis not present

## 2016-07-03 LAB — CBC
HCT: 33.3 % — ABNORMAL LOW (ref 36.0–46.0)
Hemoglobin: 11.1 g/dL — ABNORMAL LOW (ref 12.0–15.0)
MCH: 28.9 pg (ref 26.0–34.0)
MCHC: 33.3 g/dL (ref 30.0–36.0)
MCV: 86.7 fL (ref 78.0–100.0)
PLATELETS: 285 10*3/uL (ref 150–400)
RBC: 3.84 MIL/uL — AB (ref 3.87–5.11)
RDW: 13.9 % (ref 11.5–15.5)
WBC: 9.5 10*3/uL (ref 4.0–10.5)

## 2016-07-03 LAB — BASIC METABOLIC PANEL
Anion gap: 7 (ref 5–15)
BUN: 7 mg/dL (ref 6–20)
CALCIUM: 8.6 mg/dL — AB (ref 8.9–10.3)
CO2: 24 mmol/L (ref 22–32)
CREATININE: 0.44 mg/dL (ref 0.44–1.00)
Chloride: 101 mmol/L (ref 101–111)
GFR calc Af Amer: >60 mL/min (ref >60)
Glucose, Bld: 91 mg/dL (ref 65–99)
POTASSIUM: 3.4 mmol/L — AB (ref 3.5–5.1)
SODIUM: 132 mmol/L — AB (ref 135–145)

## 2016-07-03 MED ORDER — ACETAMINOPHEN 325 MG PO TABS: 650.0000 mg | ORAL_TABLET | Freq: Four times a day (QID) | ORAL | Status: DC | PRN

## 2016-07-03 MED ORDER — SODIUM CHLORIDE 0.9% FLUSH
3.0000 mL | Freq: Two times a day (BID) | INTRAVENOUS | Status: DC
  Administered 2016-07-04 – 2016-07-05 (×2): 3 mL via INTRAVENOUS

## 2016-07-03 MED ORDER — FAMOTIDINE 20 MG PO TABS
20.0000 mg | ORAL_TABLET | Freq: Two times a day (BID) | ORAL | Status: DC
  Administered 2016-07-03 – 2016-07-04 (×2): 20 mg via ORAL
  Filled 2016-07-03 (×4): qty 1

## 2016-07-03 MED ORDER — ONDANSETRON HCL 4 MG/2ML IJ SOLN: 4.0000 mg | Freq: Four times a day (QID) | INTRAMUSCULAR | Status: DC | PRN

## 2016-07-03 MED ORDER — SODIUM CHLORIDE 0.9 % IV SOLN
INTRAVENOUS | Status: AC
  Administered 2016-07-03: 1000 mL via INTRAVENOUS

## 2016-07-03 MED ORDER — ONDANSETRON HCL 4 MG PO TABS: 4.0000 mg | ORAL_TABLET | Freq: Four times a day (QID) | ORAL | Status: DC | PRN

## 2016-07-03 MED ORDER — HALOPERIDOL LACTATE 5 MG/ML IJ SOLN
1.0000 mg | Freq: Four times a day (QID) | INTRAMUSCULAR | Status: DC | PRN
  Administered 2016-07-03 – 2016-07-04 (×3): 1 mg via INTRAVENOUS
  Filled 2016-07-03 (×3): qty 1

## 2016-07-03 MED ORDER — POLYVINYL ALCOHOL 1.4 % OP SOLN: 1.0000 [drp] | Freq: Every evening | OPHTHALMIC | Status: DC | PRN

## 2016-07-03 MED ORDER — AMLODIPINE BESYLATE 5 MG PO TABS
5.0000 mg | ORAL_TABLET | Freq: Every day | ORAL | Status: DC
  Administered 2016-07-03: 5 mg via ORAL
  Filled 2016-07-03 (×2): qty 1

## 2016-07-03 MED ORDER — ACETAMINOPHEN 650 MG RE SUPP
650.0000 mg | Freq: Four times a day (QID) | RECTAL | Status: DC | PRN
  Filled 2016-07-03: qty 1

## 2016-07-03 MED ORDER — ALENDRONATE SODIUM 70 MG PO TABS: 70.0000 mg | ORAL_TABLET | ORAL | Status: DC

## 2016-07-03 MED ORDER — ASPIRIN EC 81 MG PO TBEC
81.0000 mg | DELAYED_RELEASE_TABLET | Freq: Every day | ORAL | Status: DC
  Administered 2016-07-03: 81 mg via ORAL
  Filled 2016-07-03 (×3): qty 1

## 2016-07-03 MED ORDER — INFLUENZA VAC SPLIT QUAD 0.5 ML IM SUSY: 0.5000 mL | PREFILLED_SYRINGE | INTRAMUSCULAR | Status: DC

## 2016-07-03 NOTE — Clinical Social Work Placement (Signed)
   CLINICAL SOCIAL WORK PLACEMENT  NOTE  Date:  07/03/2016  Patient Details  Name: CARRERA PENGELLY MRN: [id number] Date of Birth: [birth year]  Clinical Social Work is seeking post-discharge placement for this patient at the [place] level of care (*CSW will initial, date and re-position this form in  chart as items are completed):  Yes   Patient/family provided with [place] Work Department's list of facilities offering this level of care within the geographic area requested by the patient (or if unable, by the patient's family).  Yes   Patient/family informed of their freedom to choose among providers that offer the needed level of care, that participate in Medicare, Medicaid or managed care program needed by the patient, have an available bed and are willing to accept the patient.  Yes   Patient/family informed of [facility]'s ownership interest in [facility] and [facility], as well as of the fact that they are under no obligation to receive care at these facilities.  PASRR submitted to EDS on       PASRR number received on       Existing PASRR number confirmed on       FL2 transmitted to all facilities in geographic area requested by pt/family on 07/03/16     FL2 transmitted to all facilities within larger geographic area on       Patient informed that his/her managed care company has contracts with or will negotiate with certain facilities, including the following:        Yes   Patient/family informed of bed offers received.  Patient chooses bed at  ([facility] )     Physician recommends and patient chooses bed at      Patient to be transferred to   on  .  Patient to be transferred to facility by       Patient family notified on   of transfer.  Name of family member notified:        PHYSICIAN Please sign FL2, Please prepare prescriptions, Please sign DNR     Additional Comment:     _______________________________________________ Judeth Horn, LCSW 07/03/2016, 3:19 PM

## 2016-07-03 NOTE — ED Notes (Signed)
PT at bedside.

## 2016-07-03 NOTE — ED Notes (Signed)
PT will recommend skilled facility and currently still hallucinating.

## 2016-07-03 NOTE — ED Notes (Signed)
Pt's family states pt is starting to have hallucinations and pulling at cords;

## 2016-07-03 NOTE — Evaluation (Addendum)
Physical Therapy Evaluation Patient Details Name: Whitney Marquez MRN: [id number] DOB: 09/11/1923 Today's Date: 07/03/2016   History of Present Illness  82 yo admitted with slurred speech and LLE weakness with MRI negative, acute confusion and encephalopathy since arrival. Pt with recent fall and right hip pain 9/29 xrays negative. PMHx: breast CA, colon CA, HTN, arthritis, AS, HLD, recent UTI  Clinical Impression  Pt pleasant, confused, hallucinating and impulsive. Pt unaware of situation, place, decreased safety or deficits. Pt with decreased balance, safety, mobility and strength who will benefit from acute therapy to maximize mobility, function and gait to decrease burden of care. Daughter present throughout session and agreeable to need for SNF at this time.     Follow Up Recommendations SNF;Supervision/Assistance - 24 hour    Equipment Recommendations  None recommended by PT    Recommendations for Other Services       Precautions / Restrictions Precautions Precautions: Fall      Mobility  Bed Mobility Overal bed mobility: Needs Assistance Bed Mobility: Supine to Sit;Sit to Supine     Supine to sit: Min assist Sit to supine: Max assist;+2 for safety/equipment   General bed mobility comments: min assist with increased time and needing to hold pt hands to not pull out lines, with return to bed pt not following commands, sitting prematurely and required max +2 to safely return to bed  Transfers Overall transfer level: Needs assistance   Transfers: Sit to/from Stand Sit to Stand: Min assist         General transfer comment: min assist to stand from bed, to/from toilet, mod to bed due to confusion  Ambulation/Gait Ambulation/Gait assistance: Mod assist;+2 safety/equipment Ambulation Distance (Feet): 150 Feet Assistive device: Rolling walker (2 wheeled) Gait Pattern/deviations: Trunk flexed;Shuffle   Gait velocity interpretation: at or above normal speed for  age/gender General Gait Details: cues for posture, tactile assist to step into RW and mod assist to direct RW. Pt varies between lifting RW, pulling back against it and actually initiating movement. 2nd person for IV and safety. Seated rest at toilet after 75'  Stairs            Wheelchair Mobility    Modified Rankin (Stroke Patients Only)       Balance Overall balance assessment: Needs assistance   Sitting balance-Leahy Scale: Poor       Standing balance-Leahy Scale: Zero                               Pertinent Vitals/Pain Pain Assessment: No/denies pain    Home Living Family/patient expects to be discharged to:: Skilled nursing facility Living Arrangements: Alone Available Help at Discharge: Available PRN/intermittently;Family;Friend(s) Type of Home: Independent living facility Home Access: Level entry     Home Layout: One level Home Equipment: Walker - 4 wheels;Cane - quad;Grab bars - tub/shower;Grab bars - toilet;Shower seat      Prior Function Level of Independence: Independent with assistive device(s)         Comments: Uses rollator at all times. Pt was in ILF at [facility], walking, cognitively intact, and light cooking     Hand Dominance        Extremity/Trunk Assessment   Upper Extremity Assessment: Generalized weakness           Lower Extremity Assessment: Generalized weakness      Cervical / Trunk Assessment: Kyphotic  Communication   Communication: No difficulties  Cognition Arousal/Alertness: Awake/alert  Behavior During Therapy: Impulsive Overall Cognitive Status: Impaired/Different from baseline Area of Impairment: Orientation;Attention;Memory;Following commands;Safety/judgement;Awareness;Problem solving Orientation Level: Disoriented to;Place;Time;Situation Current Attention Level: Focused Memory: Decreased short-term memory Following Commands: Follows one step commands inconsistently Safety/Judgement:  Decreased awareness of safety;Decreased awareness of deficits   Problem Solving: Difficulty sequencing;Requires verbal cues;Requires tactile cues General Comments: pt hallucinating that bugs are in daughter's hair, unable to follow more than one step commands, internally distracted    General Comments      Exercises     Assessment/Plan    PT Assessment Patient needs continued PT services  PT Problem List Decreased strength;Decreased mobility;Decreased safety awareness;Decreased coordination;Decreased activity tolerance;Decreased cognition;Decreased balance;Decreased knowledge of use of DME          PT Treatment Interventions Gait training;Functional mobility training;Therapeutic activities;Therapeutic exercise;Balance training;Patient/family education;DME instruction;Cognitive remediation    PT Goals (Current goals can be found in the Care Plan section)  Acute Rehab PT Goals Patient Stated Goal: Return to [facility] PT Goal Formulation: With family Time For Goal Achievement: 07/17/16 Potential to Achieve Goals: Fair    Frequency Min 3X/week   Barriers to discharge Decreased caregiver support      Co-evaluation               End of Session Equipment Utilized During Treatment: Gait belt Activity Tolerance: Patient tolerated treatment well Patient left: in bed;with bed alarm set;with family/visitor present Nurse Communication: Mobility status;Precautions    Functional Assessment Tool Used: clinical judgement Functional Limitation: Mobility: Walking and moving around Mobility: Walking and Moving Around Current Status [phone]): At least 40 percent but less than 60 percent impaired, limited or restricted Mobility: Walking and Moving Around Goal Status [phone]): At least 20 percent but less than 40 percent impaired, limited or restricted    Time: 1136-1159 PT Time Calculation (min) (ACUTE ONLY): 23 min   Charges:   PT Evaluation $PT Eval Moderate Complexity: 1  Procedure     PT G Codes:   PT G-Codes **NOT FOR INPATIENT CLASS** Functional Assessment Tool Used: clinical judgement Functional Limitation: Mobility: Walking and moving around Mobility: Walking and Moving Around Current Status [id number] At least 40 percent but less than 60 percent impaired, limited or restricted Mobility: Walking and Moving Around Goal Status [phone]): At least 20 percent but less than 40 percent impaired, limited or restricted    Melford Aase 07/03/2016, 12:45 PM Elwyn Reach, [place]

## 2016-07-03 NOTE — ED Notes (Signed)
Pt is alert to self, time, and place; Pt has periods of confusion; pt thought the sink was a stove and could not remember why she was here; Pt quickly reoriented; Daughter remains at bedside;  Pt has slight weakness in right leg which may be from chronic condition per family

## 2016-07-03 NOTE — Progress Notes (Signed)
Patient admitted after midnight, please see H&P.  Patient very impulsive and very paranoid.  Has been on cipro so possibly medication related but this is very much different from her baseline of 1 month ago.  Will try haldol to calm patient.  Discussed with family at bedside. Eulogio Bear DO

## 2016-07-03 NOTE — Discharge Planning (Signed)
Pt currently active with [facility] for PT/OT services.  Resumption of care can be requested if pt does not qualify for SNF placement. CM will continue to follow.

## 2016-07-03 NOTE — ED Notes (Signed)
Patient was given a toothbrush toothpaste and basin with cup of water to rinse, and 2 wash cloths.

## 2016-07-03 NOTE — ED Notes (Signed)
Ambulated Pt with a Walker to the bathroom across from her room. Pt ambulated well with Walker with little to no assistance from myself or family. Pt did not complain of any pain while walking. Was having minor pain in back before standing, but did not complain of any while walking.

## 2016-07-03 NOTE — ED Notes (Signed)
Spoke with Admit Doctor regarding patient. Patient became increased restless attempting to grab object present and not present. HR increased to 140-150 Sinus Tach RR 26.  Nurse and family member attempted to calm patient down repositioned and dimmed lights.  Patient started to calm down and then son came in room. Immediately after son walked in room patient started to become less restless and HR decreased to 100 RR 20-22.  Pulse oximetry 90% Room air. Placed on  2L pulse oximetry 95%.

## 2016-07-03 NOTE — Progress Notes (Signed)

## 2016-07-03 NOTE — ED Notes (Signed)
Admitting at bedside 

## 2016-07-03 NOTE — Progress Notes (Signed)
CSW spoke with Sam in admissions at [facility] [phone]). Patient was previously at [facility] but was recently moved to the [facility]. Patient's family would like for Patient to return to [facility] at discharge.          Emiliano Dyer, LCSW [facility] ED/45M Clinical Social Worker [phone]

## 2016-07-03 NOTE — ED Notes (Signed)
A regular diet was ordered for lunch.

## 2016-07-03 NOTE — H&P (Signed)
History and Physical    Whitney Marquez [id number] DOB: October 18, 1922 DOA: 07/02/2016  PCP: Mathews Argyle, MD   Patient coming from: [facility] living facility  Chief Complaint: Referred for evaluation of slurred/incoherent speech and left leg weakness  HPI: Whitney Marquez is a 82 y.o. woman with a history of aortic stenosis, HTN, HLD, and breast and colon cancers who is a resident in an independent living facility.  She was admitted to the hospital 9/23-9/25 after presenting with confusion.  She had extensive stroke work-up and neurology was consulted.  MRI did not show acute CVA.  She was diagnosed with a UTI.  She was discharged on full strength Bactrim.  She was back in the ED on 9/29 after having a fall that resulted in a laceration over her right eye and extensive bruising involving most of her right face and neck.  She was diagnosed with UTI again and was discharged to home with a prescription for cipro 500mg  BID x 7 days.  Today, the RN evaluating her at her facility noted slurred speech, and left leg weakness.  The patient was sent back to the ED.    She has had ongoing right hip, leg pain since her fall.  Xrays done at her initial presentation were negative for acute fracture.She has not had any chest pain or shortness of breath.  She has had intermittent low blood pressures at her facility, but she has been asymptomatic.  No nausea or vomiting.  No leg swelling.  ED Course: MRI tonight is negative for acute CVA.  However, she has evidence of dehydration and she has a positive D-Dimer of unclear clinical significance.  U/A looks similar to prior but past urine cultures did not show significant growth.  Normal lactic acid level.  Leukocytosis improving.  She has received 1L NS bolus in the ED.  Hospitalist asked to admit again for observation for acute metabolic encephalopathy.  Agree with ED attending that we will hold all antibiotics for now and observe.  The patient does not  have signs of acute infection at this time.  Review of Systems: As per HPI otherwise 10 point review of systems negative.    Past Medical History:  Diagnosis Date  . Aortic valve stenosis   . Arthritis   . Cancer [facility])    breast and colon  . Cancer ([place])   . Cataract   . Cataracts, both eyes   . Hypertension   . Plantar keratosis   . Scoliosis   History of L1 compression fracture  Past Surgical History:  Procedure Laterality Date  . BACK SURGERY    . BREAST SURGERY     breast cancer  . COLON SURGERY     colon cancer  . COLON SURGERY    . LUMBAR LAMINECTOMY    . meniscus tear    . TOE AMPUTATION     second toe     reports that she has never smoked. She has never used smokeless tobacco. She reports that she does not drink alcohol. Her drug history is not on file.  No illicit drug use.  She is a widow.  She has 9 children.  Currently in independent living.  Allergies  Allergen Reactions  . Cephalosporins Anaphylaxis  . Iodine     unknown  . Other Swelling    Walnuts causes lips swelling  . Pyridium [Phenazopyridine Hcl]     unknown  . Macrobid [Nitrofurantoin Monohyd Macro] Swelling and Rash  . Penicillins Hives and  Rash    Has patient had a PCN reaction causing immediate rash, facial/tongue/throat swelling, SOB or lightheadedness with hypotension: Unknown Has patient had a PCN reaction causing severe rash involving mucus membranes or skin necrosis: Unknown Has patient had a PCN reaction that required hospitalization: No  Has patient had a PCN reaction occurring within the last 10 years: Unknown If all of the above answers are "NO", then may proceed with Cephalosporin use.   . Shellfish Allergy Rash   No pertinent family history.  Prior to Admission medications   Medication Sig Start Date End Date Taking? Authorizing Provider  alendronate (FOSAMAX) 70 MG tablet Take 70 mg by mouth every Monday. Take with a full glass of water on an empty stomach.     Historical  Provider, MD  amLODipine (NORVASC) 5 MG tablet Take 5 mg by mouth daily.    Historical Provider, MD  aspirin 81 MG tablet Take 81 mg by mouth daily with breakfast.     Historical Provider, MD  Calcium-Vitamin D-Vitamin K 500-100-40 MG-UNT-MCG CHEW Chew 1 tablet by mouth 3 (three) times daily with meals.     Historical Provider, MD  ciprofloxacin (CIPRO) 500 MG tablet Take 1 tablet (500 mg total) by mouth 2 (two) times daily. 06/30/16   Fredia Sorrow, MD  fenofibrate 54 MG tablet Take 54 mg by mouth daily.    Historical Provider, MD  HYDROcodone-acetaminophen (NORCO/VICODIN) 5-325 MG per tablet Take 1 tablet by mouth every 6 (six) hours as needed for moderate pain.    Suella Broad, MD  Multiple Vitamins-Minerals (CENTRUM SILVER PO) Take 1 tablet by mouth daily.    Historical Provider, MD  naproxen sodium (ALEVE) 220 MG tablet Take 220 mg by mouth 2 (two) times daily with a meal.     Historical Provider, MD  omeprazole (PRILOSEC) 20 MG capsule Take 20 mg by mouth daily.    Historical Provider, MD  Polyvinyl Alcohol-Povidone (REFRESH OP) Apply 1-2 drops to eye at bedtime as needed (for dry eyes).    Historical Provider, MD  sulfamethoxazole-trimethoprim (BACTRIM DS,SEPTRA DS) 800-160 MG tablet Take 1 tablet by mouth every 12 (twelve) hours. 06/26/16   Patrecia Pour, MD    Physical Exam: Vitals:   07/02/16 2300 07/03/16 0030 07/03/16 0040 07/03/16 0100  BP: 124/82 126/79  154/81  Pulse:  88  90  Resp: 18 21  18   Temp:   98 F (36.7 C)   TempSrc:      SpO2:  95%  94%      Constitutional: NAD, appears stated age but calm and appropriate Vitals:   07/02/16 2300 07/03/16 0030 07/03/16 0040 07/03/16 0100  BP: 124/82 126/79  154/81  Pulse:  88  90  Resp: 18 21  18   Temp:   98 F (36.7 C)   TempSrc:      SpO2:  95%  94%   Eyes: PERRL, lids and conjunctivae normal.  Laceration over right eye. ENMT: Mucous membranes are DRY. Posterior pharynx clear of any exudate or lesions. Normal  dentition.  Neck: normal appearance, supple Respiratory: clear to auscultation bilaterally, no wheezing, no crackles. Normal respiratory effort. No accessory muscle use.  Cardiovascular: Normal rate, regular rhythm, + II-III/VI systolic murmur.  No extremity edema. 2+ pedal pulses. No carotid bruits.  GI: abdomen is soft and compressible.  No distention.  No tenderness.  No masses palpated.  Bowel sounds are present. Musculoskeletal:  No joint deformity in upper and lower extremities. Good ROM, no contractures.  Normal muscle tone.  Skin: Extensive bruising on right side of face Neurologic: CN 2-12 grossly intact. Sensation intact, Strength symmetric bilaterally, 5/5.  No apparent focal deficits at this time. Psychiatric: Hard of hearing but she is alert and oriented x 4.  Normal mood.  Affect appropriate.     Labs on Admission: I have personally reviewed following labs and imaging studies  CBC:  Recent Labs Lab 07/02/16 2010  WBC 11.5*  NEUTROABS 7.9*  HGB 11.8*  HCT 36.2  MCV 87.9  PLT [id number]   Basic Metabolic Panel:  Recent Labs Lab 07/02/16 2010  NA 130*  K 3.6  CL 97*  CO2 23  GLUCOSE 101*  BUN 7  CREATININE 0.48  CALCIUM 9.2   GFR: Estimated Creatinine Clearance: 37.9 mL/min (by C-G formula based on SCr of 0.48 mg/dL). Liver Function Tests:  Recent Labs Lab 07/02/16 2010  AST 23  ALT 17  ALKPHOS 42  BILITOT 0.6  PROT 6.2*  ALBUMIN 3.5    Recent Labs Lab 07/02/16 2010  LIPASE 40   Coagulation Profile:  Recent Labs Lab 07/02/16 2010  INR 1.01   Thyroid Function Tests:  Recent Labs  07/02/16 2010  TSH 4.127   Urine analysis:    Component Value Date/Time   COLORURINE YELLOW 07/02/2016 2142   APPEARANCEUR CLOUDY (A) 07/02/2016 2142   LABSPEC 1.015 07/02/2016 2142   PHURINE 7.5 07/02/2016 2142   GLUCOSEU NEGATIVE 07/02/2016 2142   HGBUR TRACE (A) 07/02/2016 2142   BILIRUBINUR NEGATIVE 07/02/2016 2142   KETONESUR NEGATIVE 07/02/2016 2142     PROTEINUR NEGATIVE 07/02/2016 2142   UROBILINOGEN 0.2 10/15/2014 0125   NITRITE NEGATIVE 07/02/2016 2142   LEUKOCYTESUR MODERATE (A) 07/02/2016 2142   Sepsis Labs:  Lactic acid level 0.91  Radiological Exams on Admission: Dg Chest 2 View  Result Date: 07/02/2016 CLINICAL DATA:  Fall 2 days ago with chest pain, initial encounter EXAM: CHEST  2 VIEW COMPARISON:  05/17/2016 FINDINGS: Cardiac shadow is stable. Aortic calcifications are again seen. Elevation the right hemidiaphragm is noted. Some mild interstitial changes are again seen and stable. No pneumothorax or focal infiltrate is seen. Bony structures show no acute abnormality. IMPRESSION: No active cardiopulmonary disease. Electronically Signed   By: Inez Catalina M.D.   On: 07/02/2016 19:57   Ct Head Wo Contrast  Result Date: 07/02/2016 CLINICAL DATA:  Right-sided bruising after falling 2 days ago. Slurred speech. Generalized weakness. EXAM: CT HEAD WITHOUT CONTRAST TECHNIQUE: Contiguous axial images were obtained from the base of the skull through the vertex without intravenous contrast. COMPARISON:  Head CT 06/30/2016 and 06/24/2016. MRI brain 06/25/2016. FINDINGS: Despite efforts by the technologist and patient, motion artifact is present on today's exam and could not be eliminated. This reduces exam sensitivity and specificity. Brain: There is no evidence of acute intracranial hemorrhage, mass lesion, brain edema or new extra-axial fluid collection. There is stable diffuse prominence of the subarachnoid spaces, especially over the frontal lobes. No hydrocephalus. There is no CT evidence of acute cortical infarction. Stable chronic small vessel ischemic changes in the periventricular white matter. Vascular: Extensive intracranial vascular calcifications are again noted. Skull: Negative for fracture or focal lesion. Sinuses/Orbits: Periorbital swelling on the right appears improved. The visualized paranasal sinuses, mastoid air cells and  middle ears are clear. Other: None. IMPRESSION: 1. Interval improvement in periorbital swelling/hematoma on the right. 2. No acute intracranial findings. 3. Stable atrophy and chronic small vessel ischemic changes. Electronically Signed   By: Gwyndolyn Saxon  Lin Landsman M.D.   On: 07/02/2016 20:29   Mr Brain Ltd W/o Cm  Result Date: 07/03/2016 CLINICAL DATA:  82 y/o F; slurred speech, a aphasia, and right leg weakness. EXAM: MRI HEAD WITHOUT CONTRAST TECHNIQUE: Sagittal T1, axial DWI, and coronal DWI sequences were acquired. The patient was unable to continue the exam. COMPARISON:  07/02/2016 CT head.  06/25/2016 MR head. FINDINGS: Motion degraded sagittal T1 and axial DWI sequences. No diffusion signal abnormality is identified. Hyperostosis frontalis interna. Moderate parenchymal volume loss. T1 hypo intense foci in subcortical and periventricular white matter as well as within the if corpus callosum are consistent with chronic microvascular ischemic changes and small lacunar infarcts. Mucous thickening within the sphenoid sinus. IMPRESSION: No diffusion signal abnormality to suggest acute/early subacute infarct. Electronically Signed   By: Kristine Garbe M.D.   On: 07/03/2016 00:22    EKG: Independently reviewed. Junctional tachycardia; similar to one week ago.  Assessment/Plan Principal Problem:   Acute metabolic encephalopathy Active Problems:   Essential hypertension   Delirium   Dehydration   History of recent fall   Right hip pain   Positive D-dimer   Aortic stenosis      Acute metabolic encephalopathy due to polypharmacy, recent UTI --I believe she has asymptomatic pyuria at this point because her most recent blood cultures have not shown significant growth.  I believe that treatment with full strength Bactrim followed by Cipro at 500mg  BID was likely too much for her and contributing to her mental status changes.  I have discussed this specifically with the patient's daughter, and we  will hold all antibiotics for now and observe. --Avoid narcotics (holding lortab) --Hold PPI; substitute pepcid --TIA is certainly still possible given transient neurological symptoms.  Stroke has been ruled out, and she had extensive neurological work-up one week ago.  She is on a baby aspirin and fenofibrate.  I would not add a statin at her age since we are already concerned about polypharmacy. Neuro checks q4h and continue to observe. --I truly think she is now appropriate for SNF.  Recurrent presentations to the ED are only going to make potential delirium worse.  History of aortic stenosis --Complete echo done last admission with stroke work-up.  Dehydration --Cautious hydration with IV fluids.  NS at 75cc/hr x 8 hours then stop.  Repeat BMP in the AM.  Essential hypertension --Continue amlodipine.  Positive D-Dimer of unclear clinical significance --I would not risk contrast exposure in the this patient at this time (she has an allergy to iodine, shellfish) and gave this explicit recommendation to the patient's daughter, who is in agreement.  No clinical signs concerning for PE.  Can consider V/Q scan in AM.  Will order U/S of RLL for now she has had ongoing pain in her right leg since her fall.  Debility --PT and OT eval and treat; I think she needs consideration for SNF placement this time.     DVT prophylaxis: SCDs  Code Status: DNR Family Communication: Patient's daughter Belenda Cruise at bedside in the ED at time of admission Disposition Plan: To be determined.  I think she needs SNF (higher level of care than independent or assisted living). Consults called: NONE Admission status: Observation, telemetry   TIME SPENT: 70 minutes   Eber Jones MD [facility] Pager [phone]  If 7PM-7AM, please contact night-coverage [email or web address] Password [email or web address]  07/03/2016, 2:07 AM

## 2016-07-03 NOTE — ED Notes (Signed)
Report called and accepted

## 2016-07-03 NOTE — ED Notes (Signed)
RN spoke with Md regarding pt's decline in mental status per family at bedside; Pt seems to be a little more agitated and unaware of her surrounds or circumstances; Pt is now having hallucinations per familly

## 2016-07-03 NOTE — ED Notes (Signed)
Pt ambulated to restroom with a walker and this rn beside pt. Pt ambulates with steady gait. Pt continues to have hallucinations.

## 2016-07-03 NOTE — NC FL2 (Signed)
[place] MEDICAID FL2 LEVEL OF CARE SCREENING TOOL     IDENTIFICATION  Patient Name: Whitney Marquez Birthdate: 09-22-1923 Sex: female Admission Date (Current Location): 07/02/2016  [facility] and [place] Number:  Herbalist and Address:  [employer] [facility], [place] [address], [place], [place] [address]      Provider Number: [id number]  Attending Physician Name and Address:  Geradine Girt, DO  Relative Name and Phone Number:  Clarita Crane (Daughter) [phone]    Current Level of Care: Hospital Recommended Level of Care: [place] Prior Approval Number:    Date Approved/Denied:   PASRR Number:    Discharge Plan: SNF    Current Diagnoses: Patient Active Problem List   Diagnosis Date Noted  . Delirium 07/03/2016  . Acute metabolic encephalopathy 99991111  . Dehydration 07/03/2016  . History of recent fall 07/03/2016  . Right hip pain 07/03/2016  . Positive D-dimer 07/03/2016  . Aortic stenosis 07/03/2016  . Muscle weakness (generalized)   . Confusion 06/24/2016  . CVA (cerebral infarction) 06/24/2016  . UTI (lower urinary tract infection) 06/24/2016  . SIRS (systemic inflammatory response syndrome) ([place]) 10/20/2014  . Acute ischemic colitis ([place]) 10/19/2014  . Compression fracture of L1 lumbar vertebra with routine healing 10/19/2014  . Hypokalemia   . Anemia 10/15/2014  . Essential hypertension 10/15/2014  . Hyperlipidemia 10/15/2014    Orientation RESPIRATION BLADDER Height & Weight     Self  O2 (2L Nasal Cannula) Continent Weight:   Height:     BEHAVIORAL SYMPTOMS/MOOD NEUROLOGICAL BOWEL NUTRITION STATUS   (NONE)  (NONE) Continent Diet (Heart Healthy)  AMBULATORY STATUS COMMUNICATION OF NEEDS Skin   Limited Assist Verbally Normal                       Personal Care Assistance Level of Assistance  Bathing, Feeding, Dressing Bathing Assistance: Limited assistance Feeding assistance:  Independent Dressing Assistance: Limited assistance     Functional Limitations Info  Sight, Hearing, Speech Sight Info: Adequate Hearing Info: Adequate Speech Info: Adequate    SPECIAL CARE FACTORS FREQUENCY  PT (By licensed PT)     PT Frequency: min 3x/week               Contractures Contractures Info: Not present    Additional Factors Info  Code Status, Allergies Code Status Info: DNR Allergies Info: Cephalosporins, Iodine, walnuts, Pyridium, Aleve, Macrobid, Penicillins, Shellfish            Current Medications (07/03/2016):  This is the current hospital active medication list Current Facility-Administered Medications  Medication Dose Route Frequency Provider Last Rate Last Dose  . acetaminophen (TYLENOL) tablet 650 mg  650 mg Oral Q6H PRN Lily Kocher, MD       Or  . acetaminophen (TYLENOL) suppository 650 mg  650 mg Rectal Q6H PRN Lily Kocher, MD      . amLODipine (NORVASC) tablet 5 mg  5 mg Oral Daily Lily Kocher, MD   5 mg at 07/03/16 0814  . aspirin EC tablet 81 mg  81 mg Oral Q breakfast Lily Kocher, MD   81 mg at 07/03/16 0814  . famotidine (PEPCID) tablet 20 mg  20 mg Oral BID Lily Kocher, MD   20 mg at 07/03/16 0815  . ondansetron (ZOFRAN) tablet 4 mg  4 mg Oral Q6H PRN Lily Kocher, MD       Or  . ondansetron [facility]) injection 4 mg  4 mg  Intravenous Q6H PRN Lily Kocher, MD      . polyvinyl alcohol (LIQUIFILM TEARS) 1.4 % ophthalmic solution 1 drop  1 drop Both Eyes QHS PRN Lily Kocher, MD      . sodium chloride flush (NS) 0.9 % injection 3 mL  3 mL Intravenous Q12H Lily Kocher, MD       Current Outpatient Prescriptions  Medication Sig Dispense Refill  . amLODipine (NORVASC) 5 MG tablet Take 5 mg by mouth daily.    Marland Kitchen aspirin 81 MG tablet Take 81 mg by mouth daily with breakfast.     . Calcium-Vitamin D-Vitamin K 500-100-40 MG-UNT-MCG CHEW Chew 1 tablet by mouth 3 (three) times daily with meals.     . ciprofloxacin (CIPRO) 500 MG tablet Take 1  tablet (500 mg total) by mouth 2 (two) times daily. 14 tablet 0  . fenofibrate 54 MG tablet Take 54 mg by mouth daily.    Marland Kitchen HYDROcodone-acetaminophen (NORCO/VICODIN) 5-325 MG per tablet Take 1 tablet by mouth every 6 (six) hours as needed for moderate pain.    . Multiple Vitamins-Minerals (CENTRUM SILVER PO) Take 1 tablet by mouth daily.    Marland Kitchen Neomycin-Bacitracin-Polymyxin (FIRST AID ANTIBIOTIC EX) Apply 1 application topically daily as needed (wound care).    Marland Kitchen omeprazole (PRILOSEC) 20 MG capsule Take 20 mg by mouth daily.    . Polyvinyl Alcohol-Povidone (REFRESH OP) Apply 1-2 drops to eye at bedtime as needed (for dry eyes).       Discharge Medications: Please see discharge summary for a list of discharge medications.  Relevant Imaging Results:  Relevant Lab Results:   Additional Information SS# [id number]  Judeth Horn, LCSW

## 2016-07-03 NOTE — ED Notes (Addendum)
Pt helped onto bed pan and able to urinate. Pt assisted with bed bath, brushing teeth and combing her hair. Daughter at bedside.

## 2016-07-03 NOTE — Care Management Obs Status (Signed)
[place] NOTIFICATION   Patient Details  Name: Whitney Marquez MRN: [id number] Date of Birth: [birth year]   Medicare Observation Status Notification Given:       Vergie Living, RN 07/03/2016, 11:03 AM

## 2016-07-03 NOTE — ED Notes (Signed)
Transferred patient to hospital bed from stretcher for comfort.  Assisted to use bedpan.  Daughter at the bedside.

## 2016-07-04 ENCOUNTER — Observation Stay (HOSPITAL_COMMUNITY): Payer: Medicare Other

## 2016-07-04 ENCOUNTER — Observation Stay (HOSPITAL_BASED_OUTPATIENT_CLINIC_OR_DEPARTMENT_OTHER): Payer: Medicare Other

## 2016-07-04 DIAGNOSIS — N390 Urinary tract infection, site not specified: Secondary | ICD-10-CM | POA: Diagnosis present

## 2016-07-04 DIAGNOSIS — M419 Scoliosis, unspecified: Secondary | ICD-10-CM | POA: Diagnosis present

## 2016-07-04 DIAGNOSIS — R410 Disorientation, unspecified: Secondary | ICD-10-CM | POA: Diagnosis not present

## 2016-07-04 DIAGNOSIS — G92 Toxic encephalopathy: Secondary | ICD-10-CM | POA: Diagnosis present

## 2016-07-04 DIAGNOSIS — M79604 Pain in right leg: Secondary | ICD-10-CM

## 2016-07-04 DIAGNOSIS — Z85038 Personal history of other malignant neoplasm of large intestine: Secondary | ICD-10-CM | POA: Diagnosis not present

## 2016-07-04 DIAGNOSIS — E860 Dehydration: Secondary | ICD-10-CM | POA: Diagnosis not present

## 2016-07-04 DIAGNOSIS — I10 Essential (primary) hypertension: Secondary | ICD-10-CM | POA: Diagnosis not present

## 2016-07-04 DIAGNOSIS — Z66 Do not resuscitate: Secondary | ICD-10-CM | POA: Diagnosis present

## 2016-07-04 DIAGNOSIS — R9401 Abnormal electroencephalogram [EEG]: Secondary | ICD-10-CM | POA: Diagnosis present

## 2016-07-04 DIAGNOSIS — E876 Hypokalemia: Secondary | ICD-10-CM | POA: Diagnosis present

## 2016-07-04 DIAGNOSIS — M1990 Unspecified osteoarthritis, unspecified site: Secondary | ICD-10-CM | POA: Diagnosis present

## 2016-07-04 DIAGNOSIS — E785 Hyperlipidemia, unspecified: Secondary | ICD-10-CM | POA: Diagnosis present

## 2016-07-04 DIAGNOSIS — Y92009 Unspecified place in unspecified non-institutional (private) residence as the place of occurrence of the external cause: Secondary | ICD-10-CM | POA: Diagnosis not present

## 2016-07-04 DIAGNOSIS — W19XXXD Unspecified fall, subsequent encounter: Secondary | ICD-10-CM | POA: Diagnosis present

## 2016-07-04 DIAGNOSIS — I350 Nonrheumatic aortic (valve) stenosis: Secondary | ICD-10-CM | POA: Diagnosis present

## 2016-07-04 DIAGNOSIS — G9341 Metabolic encephalopathy: Secondary | ICD-10-CM | POA: Diagnosis not present

## 2016-07-04 DIAGNOSIS — Z853 Personal history of malignant neoplasm of breast: Secondary | ICD-10-CM | POA: Diagnosis not present

## 2016-07-04 DIAGNOSIS — M25551 Pain in right hip: Secondary | ICD-10-CM | POA: Diagnosis present

## 2016-07-04 DIAGNOSIS — S01111D Laceration without foreign body of right eyelid and periocular area, subsequent encounter: Secondary | ICD-10-CM | POA: Diagnosis not present

## 2016-07-04 DIAGNOSIS — T368X5A Adverse effect of other systemic antibiotics, initial encounter: Secondary | ICD-10-CM | POA: Diagnosis present

## 2016-07-04 DIAGNOSIS — R4701 Aphasia: Secondary | ICD-10-CM | POA: Diagnosis present

## 2016-07-04 LAB — CBC
HCT: 38.5 % (ref 36.0–46.0)
Hemoglobin: 12.7 g/dL (ref 12.0–15.0)
MCH: 28.5 pg (ref 26.0–34.0)
MCHC: 33 g/dL (ref 30.0–36.0)
MCV: 86.3 fL (ref 78.0–100.0)
PLATELETS: 358 10*3/uL (ref 150–400)
RBC: 4.46 MIL/uL (ref 3.87–5.11)
RDW: 13.7 % (ref 11.5–15.5)
WBC: 16.5 10*3/uL — AB (ref 4.0–10.5)

## 2016-07-04 LAB — BASIC METABOLIC PANEL
ANION GAP: 15 (ref 5–15)
BUN: 13 mg/dL (ref 6–20)
CALCIUM: 9.4 mg/dL (ref 8.9–10.3)
CO2: 23 mmol/L (ref 22–32)
CREATININE: 0.62 mg/dL (ref 0.44–1.00)
Chloride: 95 mmol/L — ABNORMAL LOW (ref 101–111)
Glucose, Bld: 118 mg/dL — ABNORMAL HIGH (ref 65–99)
Potassium: 3.3 mmol/L — ABNORMAL LOW (ref 3.5–5.1)
Sodium: 133 mmol/L — ABNORMAL LOW (ref 135–145)

## 2016-07-04 LAB — URINE CULTURE

## 2016-07-04 LAB — MRSA PCR SCREENING: MRSA by PCR: NEGATIVE

## 2016-07-04 MED ORDER — SODIUM CHLORIDE 0.9 % IV SOLN
INTRAVENOUS | Status: DC
  Administered 2016-07-04 – 2016-07-05 (×2): via INTRAVENOUS

## 2016-07-04 MED ORDER — QUETIAPINE FUMARATE 25 MG PO TABS
12.5000 mg | ORAL_TABLET | Freq: Every day | ORAL | Status: DC
  Administered 2016-07-04: 12.5 mg via ORAL
  Filled 2016-07-04: qty 1

## 2016-07-04 MED ORDER — ENSURE ENLIVE PO LIQD
237.0000 mL | Freq: Two times a day (BID) | ORAL | Status: DC
  Administered 2016-07-04: 237 mL via ORAL

## 2016-07-04 NOTE — Procedures (Signed)
ELECTROENCEPHALOGRAM REPORT  Date of Study: 07/04/2016  Patient's Name: Whitney Marquez MRN: [id number] Date of Birth: 06-20-1923  Referring Provider: Tomi Bamberger. Eliseo Squires, DO  Clinical History: 82 year old woman with confusion  Medications: L1 acetaminophen (TYLENOL) suppository 650 mg  L1 acetaminophen (TYLENOL) tablet 650 mg   amLODipine (NORVASC) tablet 5 mg   aspirin EC tablet 81 mg   famotidine (PEPCID) tablet 20 mg   feeding supplement (ENSURE ENLIVE) (ENSURE ENLIVE) liquid 237 mL   Influenza vac split quadrivalent PF (FLUARIX) injection 0.5 mL  L2 ondansetron (ZOFRAN) injection 4 mg  L2 ondansetron (ZOFRAN) tablet 4 mg   polyvinyl alcohol (LIQUIFILM TEARS) 1.4 % ophthalmic solution 1 drop   QUEtiapine (SEROQUEL) tablet 12.5 mg   sodium chloride flush (NS) 0.9 % injection 3 mL  Technical Summary: A multichannel digital EEG recording measured by the international 10-20 system with electrodes applied with paste and impedances below 5000 ohms performed as portable with EKG monitoring in an awake and drowsy patient.  Hyperventilation and photic stimulation were not performed.  The digital EEG was referentially recorded, reformatted, and digitally filtered in a variety of bipolar and referential montages for optimal display.   Description: The patient is awake and drowsy during the recording.  During maximal wakefulness, there is a symmetric, medium voltage 5-6 Hz posterior dominant rhythm that attenuates with eye opening. This is admixed with diffuse 4-5 Hz theta and 2-3 Hz delta slowing of the waking background.  During drowsiness and sleep, there is an increase in theta slowing of the background.  There were no epileptiform discharges or electrographic seizures seen.    EKG lead was unremarkable.  Impression: This awake and drowsy EEG is abnormal due to diffuse slowing of the waking background.  Clinical Correlation of the above findings indicates diffuse cerebral dysfunction  that is non-specific in etiology and can be seen with hypoxic/ischemic injury, toxic/metabolic encephalopathies, neurodegenerative disorders, or medication effect.  The absence of epileptiform discharges does not rule out a clinical diagnosis of epilepsy.  Clinical correlation is advised.   Metta Clines, DO

## 2016-07-04 NOTE — Progress Notes (Signed)
VASCULAR LAB PRELIMINARY  PRELIMINARY  PRELIMINARY  PRELIMINARY  Right lower extremity venous duplex completed.    Preliminary report:  Right:  No evidence of DVT, superficial thrombosis, or Baker's cyst.  Whitney Marquez, RVS 07/04/2016, 8:43 AM

## 2016-07-04 NOTE — Progress Notes (Signed)
PROGRESS NOTE    Whitney Marquez  [id number] DOB: Dec 10, 1922 DOA: 07/02/2016 PCP: Mathews Argyle, MD   Outpatient Specialists:     Brief Narrative:  Whitney Marquez is a 82 y.o. woman with a history of aortic stenosis, HTN, HLD, and breast and colon cancers who is a resident in an independent living facility.  She was admitted to the hospital 9/23-9/25 after presenting with confusion.  She had extensive stroke work-up and neurology was consulted.  MRI did not show acute CVA.  She was diagnosed with a UTI.  She was discharged on full strength Bactrim.  She was back in the ED on 9/29 after having a fall that resulted in a laceration over her right eye and extensive bruising involving most of her right face and neck.  She was diagnosed with UTI again and was discharged to home with a prescription for cipro 500mg  BID x 7 days.  Today, the RN evaluating her at her facility noted slurred speech, and left leg weakness.  The patient was sent back to the ED.    She has had ongoing right hip, leg pain since her fall.  Xrays done at her initial presentation were negative for acute fracture.She has not had any chest pain or shortness of breath.  She has had intermittent low blood pressures at her facility, but she has been asymptomatic.  No nausea or vomiting.  No leg swelling.   Assessment & Plan:   Principal Problem:   Acute metabolic encephalopathy Active Problems:   Essential hypertension   Delirium   Dehydration   History of recent fall   Right hip pain   Positive D-dimer   Aortic stenosis   Acute metabolic encephalopathy due to polypharmacy, recent UTI --suspect due to cipro -minimize medications -SLP eval -consider hospice consult   History of aortic stenosis --Complete echo done last admission with stroke work-up.  Dehydration --gentle IVF  Essential hypertension -d/c as BP low  Positive D-Dimer of unclear clinical significance --I would not risk contrast  exposure in the this patient at this time (she has an allergy to iodine, shellfish) and gave this explicit recommendation to the patient's daughter, who is in agreement.  No clinical signs concerning for PE.  U/S negative  Debility --SNF placement    DVT prophylaxis:  SCD's  Code Status: DNR   Family Communication: Son at bedside  Disposition Plan:     Consultants:        Subjective: Will awake  Objective: Vitals:   07/03/16 2005 07/03/16 2206 07/04/16 1219 07/04/16 1300  BP:  (!) 123/94  (!) 99/59  Pulse: (!) 102 (!) 47  (!) 112  Resp:  (!) 22  18  Temp:  98.2 F (36.8 C)  98.8 F (37.1 C)  TempSrc:  Oral  Axillary  SpO2:  95%  95%  Weight:   55.8 kg (123 lb)   Height:   5\' 3"  (1.6 m)    No intake or output data in the 24 hours ending 07/04/16 1644 Filed Weights   07/04/16 1219  Weight: 55.8 kg (123 lb)    Examination:  General exam: bruising on face- awake intermittently follows some commands Respiratory system: Clear to auscultation. Respiratory effort normal. Cardiovascular system: S1 & S2 heard, RRR. No JVD, murmurs, rubs, gallops or clicks. No pedal edema. Gastrointestinal system: Abdomen is nondistended, soft and nontender. No organomegaly or masses felt. Normal bowel sounds heard. Central nervous system: Alert and oriented. No focal neurological deficits.  Data Reviewed: I have personally reviewed following labs and imaging studies  CBC:  Recent Labs Lab 07/02/16 2010 07/03/16 0400 07/04/16 0507  WBC 11.5* 9.5 16.5*  NEUTROABS 7.9*  --   --   HGB 11.8* 11.1* 12.7  HCT 36.2 33.3* 38.5  MCV 87.9 86.7 86.3  PLT 268 285 [id number]   Basic Metabolic Panel:  Recent Labs Lab 07/02/16 2010 07/03/16 0400 07/04/16 0507  NA 130* 132* 133*  K 3.6 3.4* 3.3*  CL 97* 101 95*  CO2 23 24 23   GLUCOSE 101* 91 118*  BUN 7 7 13   CREATININE 0.48 0.44 0.62  CALCIUM 9.2 8.6* 9.4   GFR: Estimated Creatinine Clearance: 37.9 mL/min (by C-G  formula based on SCr of 0.62 mg/dL). Liver Function Tests:  Recent Labs Lab 07/02/16 2010  AST 23  ALT 17  ALKPHOS 42  BILITOT 0.6  PROT 6.2*  ALBUMIN 3.5    Recent Labs Lab 07/02/16 2010  LIPASE 40   No results for input(s): AMMONIA in the last 168 hours. Coagulation Profile:  Recent Labs Lab 07/02/16 2010  INR 1.01   Cardiac Enzymes: No results for input(s): CKTOTAL, CKMB, CKMBINDEX, TROPONINI in the last 168 hours. BNP (last 3 results) No results for input(s): PROBNP in the last 8760 hours. HbA1C: No results for input(s): HGBA1C in the last 72 hours. CBG: No results for input(s): GLUCAP in the last 168 hours. Lipid Profile: No results for input(s): CHOL, HDL, LDLCALC, TRIG, CHOLHDL, LDLDIRECT in the last 72 hours. Thyroid Function Tests:  Recent Labs  07/02/16 2010  TSH 4.127   Anemia Panel: No results for input(s): VITAMINB12, FOLATE, FERRITIN, TIBC, IRON, RETICCTPCT in the last 72 hours. Urine analysis:    Component Value Date/Time   COLORURINE YELLOW 07/02/2016 2142   APPEARANCEUR CLOUDY (A) 07/02/2016 2142   LABSPEC 1.015 07/02/2016 2142   PHURINE 7.5 07/02/2016 2142   GLUCOSEU NEGATIVE 07/02/2016 2142   HGBUR TRACE (A) 07/02/2016 2142   BILIRUBINUR NEGATIVE 07/02/2016 2142   KETONESUR NEGATIVE 07/02/2016 2142   PROTEINUR NEGATIVE 07/02/2016 2142   UROBILINOGEN 0.2 10/15/2014 0125   NITRITE NEGATIVE 07/02/2016 2142   LEUKOCYTESUR MODERATE (A) 07/02/2016 2142     ) Recent Results (from the past 240 hour(s))  Urine culture     Status: None   Collection Time: 06/24/16  5:20 PM  Result Value Ref Range Status   Specimen Description URINE, RANDOM  Final   Special Requests NONE  Final   Culture NO GROWTH  Final   Report Status 06/25/2016 FINAL  Final  Urine culture     Status: Abnormal   Collection Time: 06/30/16  4:15 PM  Result Value Ref Range Status   Specimen Description URINE, RANDOM  Final   Special Requests NONE  Final   Culture (A)   Final    <10,000 COLONIES/mL INSIGNIFICANT GROWTH Performed at [facility]    Report Status 07/02/2016 FINAL  Final  Urine culture     Status: Abnormal   Collection Time: 07/02/16  9:42 PM  Result Value Ref Range Status   Specimen Description Urine  Final   Special Requests NONE  Final   Culture <10,000 COLONIES/mL INSIGNIFICANT GROWTH (A)  Final   Report Status 07/04/2016 FINAL  Final  MRSA PCR Screening     Status: None   Collection Time: 07/04/16  5:23 AM  Result Value Ref Range Status   MRSA by PCR NEGATIVE NEGATIVE Final    Comment:  The GeneXpert MRSA Assay (FDA approved for NASAL specimens only), is one component of a comprehensive MRSA colonization surveillance program. It is not intended to diagnose MRSA infection nor to guide or monitor treatment for MRSA infections.       Anti-infectives    None       Radiology Studies: Dg Chest 2 View  Result Date: 07/02/2016 CLINICAL DATA:  Fall 2 days ago with chest pain, initial encounter EXAM: CHEST  2 VIEW COMPARISON:  05/17/2016 FINDINGS: Cardiac shadow is stable. Aortic calcifications are again seen. Elevation the right hemidiaphragm is noted. Some mild interstitial changes are again seen and stable. No pneumothorax or focal infiltrate is seen. Bony structures show no acute abnormality. IMPRESSION: No active cardiopulmonary disease. Electronically Signed   By: Inez Catalina M.D.   On: 07/02/2016 19:57   Ct Head Wo Contrast  Result Date: 07/02/2016 CLINICAL DATA:  Right-sided bruising after falling 2 days ago. Slurred speech. Generalized weakness. EXAM: CT HEAD WITHOUT CONTRAST TECHNIQUE: Contiguous axial images were obtained from the base of the skull through the vertex without intravenous contrast. COMPARISON:  Head CT 06/30/2016 and 06/24/2016. MRI brain 06/25/2016. FINDINGS: Despite efforts by the technologist and patient, motion artifact is present on today's exam and could not be eliminated. This  reduces exam sensitivity and specificity. Brain: There is no evidence of acute intracranial hemorrhage, mass lesion, brain edema or new extra-axial fluid collection. There is stable diffuse prominence of the subarachnoid spaces, especially over the frontal lobes. No hydrocephalus. There is no CT evidence of acute cortical infarction. Stable chronic small vessel ischemic changes in the periventricular white matter. Vascular: Extensive intracranial vascular calcifications are again noted. Skull: Negative for fracture or focal lesion. Sinuses/Orbits: Periorbital swelling on the right appears improved. The visualized paranasal sinuses, mastoid air cells and middle ears are clear. Other: None. IMPRESSION: 1. Interval improvement in periorbital swelling/hematoma on the right. 2. No acute intracranial findings. 3. Stable atrophy and chronic small vessel ischemic changes. Electronically Signed   By: Richardean Sale M.D.   On: 07/02/2016 20:29   Mr Brain Ltd W/o Cm  Result Date: 07/03/2016 CLINICAL DATA:  82 y/o F; slurred speech, a aphasia, and right leg weakness. EXAM: MRI HEAD WITHOUT CONTRAST TECHNIQUE: Sagittal T1, axial DWI, and coronal DWI sequences were acquired. The patient was unable to continue the exam. COMPARISON:  07/02/2016 CT head.  06/25/2016 MR head. FINDINGS: Motion degraded sagittal T1 and axial DWI sequences. No diffusion signal abnormality is identified. Hyperostosis frontalis interna. Moderate parenchymal volume loss. T1 hypo intense foci in subcortical and periventricular white matter as well as within the if corpus callosum are consistent with chronic microvascular ischemic changes and small lacunar infarcts. Mucous thickening within the sphenoid sinus. IMPRESSION: No diffusion signal abnormality to suggest acute/early subacute infarct. Electronically Signed   By: Kristine Garbe M.D.   On: 07/03/2016 00:22        Scheduled Meds: . aspirin EC  81 mg Oral Q breakfast  .  famotidine  20 mg Oral BID  . feeding supplement (ENSURE ENLIVE)  237 mL Oral BID BM  . Influenza vac split quadrivalent PF  0.5 mL Intramuscular Tomorrow-1000  . QUEtiapine  12.5 mg Oral QHS  . sodium chloride flush  3 mL Intravenous Q12H   Continuous Infusions:    LOS: 0 days    Time spent: 25 min    [place], DO [facility] Pager [phone]  If 7PM-7AM, please contact night-coverage [email or web address] Password [email or web address] 07/04/2016,  4:44 PM

## 2016-07-04 NOTE — Consult Note (Signed)
            [facility] CM Primary Care Navigator  07/04/2016  Whitney Marquez 11-10-1922 [id number]  Went to see patient at the bedside to identify possible discharge needs but patient was fast asleep.  Will attempt to meet with patient at another time.        For additional questions please contact:  Edwena Felty A. Fenris Cauble, BSN, RN-BC [facility] PRIMARY CARE Navigator Cell: [phone]

## 2016-07-04 NOTE — Care Management Note (Signed)
Case Management Note  Patient Details  Name: Whitney Marquez MRN: [id number] Date of Birth: [birth year]  Subjective/Objective:                 Patient in obs for metabolic encephalopathy. From [facility].    Action/Plan:  Will DC to [facility] when medically stable.   Expected Discharge Date:  07/04/16               Expected Discharge Plan:  Skilled Nursing Facility  In-House Referral:  Clinical Social Work  Discharge planning Services  CM Consult  Post Acute Care Choice:  NA Choice offered to:  NA  DME Arranged:  N/A DME Agency:  NA  HH Arranged:  NA HH Agency:  NA  Status of Service:  Completed, signed off  If discussed at [employer] of Stay Meetings, dates discussed:    Additional Comments:  Carles Collet, RN 07/04/2016, 2:13 PM

## 2016-07-04 NOTE — Progress Notes (Signed)
OT Cancellation Note  Patient Details Name: Whitney Marquez MRN: [id number] DOB: September 02, 1923   Cancelled Treatment:    Reason Eval/Treat Not Completed: Fatigue/lethargy limiting ability to participate. Pt sleeping very heavily. Pt's sons by bedside and stated that pt recently had Ativan and did not sleep well last night. Will check back later as able  Britt Bottom 07/04/2016, 10:36 AM

## 2016-07-04 NOTE — Progress Notes (Signed)
EEG completed; results pending.    

## 2016-07-04 NOTE — Progress Notes (Signed)
Initial Nutrition Assessment  DOCUMENTATION CODES:   Not applicable  INTERVENTION:   -Ensure Enlive po BID, each supplement provides 350 kcal and 20 grams of protein  NUTRITION DIAGNOSIS:   Predicted suboptimal nutrient intake related to lethargy/confusion as evidenced by percent weight loss, estimated needs.  GOAL:   Patient will meet greater than or equal to 90% of their needs  MONITOR:   PO intake, Supplement acceptance, Labs, Weight trends, Skin, I & O's  REASON FOR ASSESSMENT:   Low [place]    ASSESSMENT:   Whitney Marquez is a 82 y.o. woman with a history of aortic stenosis, HTN, HLD, and breast and colon cancers who is a resident in an independent living facility.  She was admitted to the hospital 9/23-9/25 after presenting with confusion.  She had extensive stroke work-up and neurology was consulted.  Pt admitted with acute encephalopathy related to polypharmacy, recent UTI. Pt is a resident of [facility].   Pt receiving nursing care at time of visit. Staff report that pt is very impulsive and confused. Pt is currently very lethargic secondary to just being given Ativan.   No PO intake data currently available. Unable to complete Nutrition-Focused physical exam at this time.   Wt hx reviewed. Wt has been stable over the past week, however, noted hx of distant wt loss over the past 2 years. Given lethargy, confusion, and advanced age, pt is at risk for suboptimal oral intake. RD will add supplements in attempt to optimize nutritional intake.   Labs reviewed: Na: 133, K: 3.3.   Diet Order:  Diet Heart Room service appropriate? Yes; Fluid consistency: Thin  Skin:  Reviewed, no issues  Last BM:  07/02/16  Height:   Ht Readings from Last 1 Encounters:  07/04/16 5\' 3"  (1.6 m)    Weight:   Wt Readings from Last 1 Encounters:  07/04/16 123 lb (55.8 kg)    Ideal Body Weight:  52.3 kg  BMI:  Body mass index is 21.79 kg/m.  Estimated Nutritional Needs:    Kcal:  1200-1400  Protein:  55-65 grams  Fluid:  1.2-1.4 L  EDUCATION NEEDS:   No education needs identified at this time  Yaire Kreher A. Jimmye Norman, RD, LDN, CDE Pager: [phone] After hours Pager: [phone]

## 2016-07-05 LAB — BASIC METABOLIC PANEL
ANION GAP: 10 (ref 5–15)
Anion gap: 15 (ref 5–15)
BUN: 33 mg/dL — ABNORMAL HIGH (ref 6–20)
BUN: 34 mg/dL — AB (ref 6–20)
CHLORIDE: 102 mmol/L (ref 101–111)
CO2: 24 mmol/L (ref 22–32)
CO2: 19 mmol/L — ABNORMAL LOW (ref 22–32)
CREATININE: 0.71 mg/dL (ref 0.44–1.00)
Calcium: 9 mg/dL (ref 8.9–10.3)
Calcium: 9.1 mg/dL (ref 8.9–10.3)
Chloride: 104 mmol/L (ref 101–111)
Creatinine, Ser: 0.65 mg/dL (ref 0.44–1.00)
GFR calc Af Amer: >60 mL/min (ref >60)
GFR calc non Af Amer: >60 mL/min (ref >60)
Glucose, Bld: 79 mg/dL (ref 65–99)
Glucose, Bld: 82 mg/dL (ref 65–99)
POTASSIUM: 2.9 mmol/L — AB (ref 3.5–5.1)
Potassium: 3.9 mmol/L (ref 3.5–5.1)
SODIUM: 136 mmol/L (ref 135–145)
SODIUM: 138 mmol/L (ref 135–145)

## 2016-07-05 LAB — CBC
HEMATOCRIT: 34.8 % — AB (ref 36.0–46.0)
HEMOGLOBIN: 11.2 g/dL — AB (ref 12.0–15.0)
MCH: 28.4 pg (ref 26.0–34.0)
MCHC: 32.2 g/dL (ref 30.0–36.0)
MCV: 88.3 fL (ref 78.0–100.0)
Platelets: 337 10*3/uL (ref 150–400)
RBC: 3.94 MIL/uL (ref 3.87–5.11)
RDW: 13.9 % (ref 11.5–15.5)
WBC: 12.4 10*3/uL — ABNORMAL HIGH (ref 4.0–10.5)

## 2016-07-05 LAB — MAGNESIUM: MAGNESIUM: 2 mg/dL (ref 1.7–2.4)

## 2016-07-05 MED ORDER — LIDOCAINE 5 % EX PTCH: 1.0000 | MEDICATED_PATCH | CUTANEOUS | Status: DC

## 2016-07-05 MED ORDER — POTASSIUM CHLORIDE 10 MEQ/100ML IV SOLN
10.0000 meq | INTRAVENOUS | Status: AC
  Administered 2016-07-05 (×4): 10 meq via INTRAVENOUS
  Filled 2016-07-05 (×4): qty 100

## 2016-07-05 MED ORDER — LIDOCAINE 5 % EX PTCH
1.0000 | MEDICATED_PATCH | CUTANEOUS | Status: DC
  Administered 2016-07-05: 1 via TRANSDERMAL
  Filled 2016-07-05: qty 1

## 2016-07-05 MED ORDER — POTASSIUM CHLORIDE IN NACL 20-0.9 MEQ/L-% IV SOLN
INTRAVENOUS | Status: DC
  Administered 2016-07-05: 17:00:00 via INTRAVENOUS
  Filled 2016-07-05 (×2): qty 1000

## 2016-07-05 NOTE — Progress Notes (Signed)
OT Cancellation Note  Patient Details Name: Whitney Marquez MRN: [id number] DOB: 01/05/1923   Cancelled Treatment:    Reason Eval/Treat Not Completed: Other (comment) (pt from SNF and plan to return to SNF upon d/c; defer OT to next venue). Please re-consult if needs change.  Binnie Kand M.S., OTR/L Pager: [phone]  07/05/2016, 4:06 PM

## 2016-07-05 NOTE — Evaluation (Signed)
Clinical/Bedside Swallow Evaluation Patient Details  Name: Whitney Marquez MRN: [id number] Date of Birth: 11/02/1922  Today's Date: 07/05/2016 Time: SLP Start Time (ACUTE ONLY): 95 SLP Stop Time (ACUTE ONLY): 0933 SLP Time Calculation (min) (ACUTE ONLY): 18 min  Past Medical History:  Past Medical History:  Diagnosis Date  . Aortic valve stenosis   . Arthritis   . Cancer [facility])    breast and colon  . Cancer ([place])   . Cataract   . Cataracts, both eyes   . Hypertension   . Plantar keratosis   . Scoliosis    Past Surgical History:  Past Surgical History:  Procedure Laterality Date  . BACK SURGERY    . BREAST SURGERY     breast cancer  . COLON SURGERY     colon cancer  . COLON SURGERY    . LUMBAR LAMINECTOMY    . meniscus tear    . TOE AMPUTATION     second toe   HPI:  Whitney B Holmesis a 82 y.o.woman with a history of aortic stenosis, HTN, HLD, and breast and colon cancers who is a resident in an independent living facility. She was admitted to the hospital 9/23-9/25 after presenting with confusion. She had extensive stroke work-up and neurology was consulted. MRI did not show acute CVA. She was diagnosed with a UTI. She was discharged on full strength Bactrim. She was back in the ED on 9/29 after having a fall that resulted in a laceration over her right eye and extensive bruising involving most of her right face and neck. She was diagnosed with UTI again and was discharged to home with a prescription for cipro 500mg  BID x 7 days. She was re-admitted with RN noted slurred speech and left leg weakness. Dx with acute metabolic encephalopathy due to polypharmacy, recent UTI.    Assessment / Plan / Recommendation Clinical Impression  Patient presents with a cognitively based oropharyngeal dysphagia. Lethargy, poor sustained attention, and decreased awareness result in significant oral holding, impaired mastication of bolus, and suspected delayed swallow reflex wtih  subsequent multiple swallows per ice chips reflective of pharyngeal component. At this time, aspiration risk high with all pos. Discussed with son and MD in room. Prognosis for ability to resume pos good with improved mentation as per son, patient without baseline dysphagia. Patient may benefit from short term alternative means of nutrition to supplement nutrition as mentation improves.     Aspiration Risk  Severe aspiration risk;Risk for inadequate nutrition/hydration    Diet Recommendation NPO;Alternative means - temporary   Medication Administration: Via alternative means    Other  Recommendations Oral Care Recommendations: Oral care QID   Follow up Recommendations  (TBD)      Frequency and Duration min 2x/week  2 weeks       Prognosis Prognosis for Safe Diet Advancement: Good Barriers to Reach Goals: Cognitive deficits      Swallow Study   General HPI: Whitney B Holmesis a 82 y.o.woman with a history of aortic stenosis, HTN, HLD, and breast and colon cancers who is a resident in an independent living facility. She was admitted to the hospital 9/23-9/25 after presenting with confusion. She had extensive stroke work-up and neurology was consulted. MRI did not show acute CVA. She was diagnosed with a UTI. She was discharged on full strength Bactrim. She was back in the ED on 9/29 after having a fall that resulted in a laceration over her right eye and extensive bruising involving most of her  right face and neck. She was diagnosed with UTI again and was discharged to home with a prescription for cipro 500mg  BID x 7 days. She was re-admitted with RN noted slurred speech and left leg weakness. Dx with acute metabolic encephalopathy due to polypharmacy, recent UTI.  Type of Study: Bedside Swallow Evaluation Previous Swallow Assessment: none Diet Prior to this Study: Regular;Thin liquids Temperature Spikes Noted: No Respiratory Status: Nasal cannula History of Recent Intubation:  No Behavior/Cognition: Lethargic/Drowsy;Distractible;Requires cueing Oral Cavity Assessment: Dry Oral Care Completed by SLP: Recent completion by staff Oral Cavity - Dentition: Dentures, top Vision: Functional for self-feeding Self-Feeding Abilities: Total assist Patient Positioning: Upright in bed Baseline Vocal Quality: Low vocal intensity Volitional Cough: Cognitively unable to elicit Volitional Swallow: Unable to elicit    Oral/Motor/Sensory Function Overall Oral Motor/Sensory Function: Generalized oral weakness   Ice Chips Ice chips: Impaired Presentation: Spoon Oral Phase Impairments: Impaired mastication;Poor awareness of bolus;Reduced lingual movement/coordination;Reduced labial seal Oral Phase Functional Implications: Prolonged oral transit;Oral residue;Oral holding Pharyngeal Phase Impairments: Multiple swallows;Suspected delayed Swallow   Thin Liquid Thin Liquid: Not tested    Nectar Thick Nectar Thick Liquid: Not tested   Honey Thick Honey Thick Liquid: Not tested   Puree Puree: Not tested   Solid   GO  Whitney Vernet MA, CCC-SLP [phone]  Solid: Not tested        Whitney Marquez 07/05/2016,9:40 AM

## 2016-07-05 NOTE — Clinical Social Work Note (Addendum)
Clinical Social Work Assessment  Patient Details  Name: Whitney Marquez MRN: [id number] Date of Birth: 04/19/1923  Date of referral:  07/04/16               Reason for consult:  Facility Placement                Permission sought to share information with:  Family Supports Permission granted to share information::  No (pt disoriented)  Name::        Agency::  [facility]  Relationship::  adult Research scientist (life sciences) Information:     Housing/Transportation Living arrangements for the past 2 months:  [place], Charity fundraiser of Information:  Adult Children, Facility Patient Interpreter Needed:  None Criminal Activity/Legal Involvement Pertinent to Current Situation/Hospitalization:  No - Comment as needed Significant Relationships:  Adult Children Lives with:  Facility Resident Do you feel safe going back to the place where you live?  Yes Need for family participation in patient care:  Yes (Comment) (decision making)  Care giving concerns:none pt has been in SNF portion at [facility] assessment / plan:  CSW confirmed with facility and family that plan is to return to [facility] at time of DC  Employment status:  Retired Forensic scientist:  Medicare PT Recommendations:  [place] / Referral to community resources:     Patient/Family's Response to care:  Agreeable to plan for pt to return to SNF when stable  Patient/Family's Understanding of and Emotional Response to Diagnosis, Current Treatment, and Prognosis:  Good understanding- hopeful pt will be stable to return home soon.  Emotional Assessment Appearance:  Appears stated age Attitude/Demeanor/Rapport:  Unable to Assess Affect (typically observed):  Unable to Assess Orientation:  Oriented to Self, Oriented to Place Alcohol / Substance use:  Not Applicable Psych involvement (Current and /or in the community):  No (Comment)  Discharge  Needs  Concerns to be addressed:  Care Coordination Readmission within the last 30 days:  Yes Current discharge risk:  Physical Impairment Barriers to Discharge:  Continued Medical Work up   Jorge Ny, LCSW 07/05/2016, 12:59 PM

## 2016-07-05 NOTE — Progress Notes (Addendum)
PROGRESS NOTE    ADELINA LATIN  [id number] DOB: Sep 23, 1923 DOA: 07/02/2016 PCP: Mathews Argyle, MD   Outpatient Specialists:     Brief Narrative:  Whitney Marquez is a 82 y.o. woman with a history of aortic stenosis, HTN, HLD, and breast and colon cancers who is a resident in an independent living facility.  She was admitted to the hospital 9/23-9/25 after presenting with confusion.  She had extensive stroke work-up and neurology was consulted.  MRI did not show acute CVA.  She was diagnosed with a UTI.  She was discharged on full strength Bactrim.  She was back in the ED on 9/29 after having a fall that resulted in a laceration over her right eye and extensive bruising involving most of her right face and neck.  She was diagnosed with UTI again and was discharged to home with a prescription for cipro 500mg  BID x 7 days.  Today, the RN evaluating her at her facility noted slurred speech, and left leg weakness.  The patient was sent back to the ED.    She has had ongoing right hip, leg pain since her fall.  Xrays done at her initial presentation were negative for acute fracture.She has not had any chest pain or shortness of breath.  She has had intermittent low blood pressures at her facility, but she has been asymptomatic.  No nausea or vomiting.  No leg swelling.   Assessment & Plan:   Principal Problem:   Acute metabolic encephalopathy Active Problems:   Essential hypertension   Delirium   Dehydration   History of recent fall   Right hip pain   Positive D-dimer   Aortic stenosis   Acute metabolic encephalopathy due to polypharmacy (? cipro) --suspect due to cipro -minimize medications -SLP eval- NPO for now due to mental status -slowly improving-- if stalls in ability to eat, consider hospice   History of aortic stenosis --Complete echo done last admission with stroke work-up.  Dehydration --gentle IVF  Essential hypertension -d/c meds as BP  low  Positive D-Dimer of unclear clinical significance --I would not risk contrast exposure in the this patient at this time (she has an allergy to iodine, shellfish) and gave this explicit recommendation to the patient's family, who is in agreement.  No clinical signs concerning for PE.  U/S of legs negative  Debility --SNF placement  Hypokalemia -replace IV  -Mg ok   DVT prophylaxis:  SCD's  Code Status: DNR   Family Communication: Son at bedside  Disposition Plan:     Consultants:        Subjective: More talkative, voice hard to understand  Objective: Vitals:   07/04/16 1300 07/04/16 2111 07/05/16 0422 07/05/16 1322  BP: (!) 99/59 125/70 (!) 111/54 124/63  Pulse: (!) 112 (!) 110 94 99  Resp: 18 18 (!) 22 20  Temp: 98.8 F (37.1 C) 99.2 F (37.3 C) 98.6 F (37 C) 98.9 F (37.2 C)  TempSrc: Axillary Oral Oral Oral  SpO2: 95%  90% 90%  Weight:      Height:        Intake/Output Summary (Last 24 hours) at 07/05/16 1330 Last data filed at 07/05/16 1300  Gross per 24 hour  Intake           765.84 ml  Output                0 ml  Net           765.84 ml  Filed Weights   07/04/16 1219  Weight: 55.8 kg (123 lb)    Examination:  General exam: bruising on face- more awake Respiratory system: Clear to auscultation. Respiratory effort normal. Cardiovascular system: S1 & S2 heard, RRR. No JVD, murmurs, rubs, gallops or clicks. No pedal edema. Gastrointestinal system: Abdomen is nondistended, soft and nontender. No organomegaly or masses felt. Normal bowel sounds heard. Central nervous system: Alert and oriented. No focal neurological deficits.     Data Reviewed: I have personally reviewed following labs and imaging studies  CBC:  Recent Labs Lab 07/02/16 2010 07/03/16 0400 07/04/16 0507 07/05/16 0442  WBC 11.5* 9.5 16.5* 12.4*  NEUTROABS 7.9*  --   --   --   HGB 11.8* 11.1* 12.7 11.2*  HCT 36.2 33.3* 38.5 34.8*  MCV 87.9 86.7 86.3 88.3   PLT 268 285 358 [id number]   Basic Metabolic Panel:  Recent Labs Lab 07/02/16 2010 07/03/16 0400 07/04/16 0507 07/05/16 0442  NA 130* 132* 133* 136  K 3.6 3.4* 3.3* 2.9*  CL 97* 101 95* 102  CO2 23 24 23 24   GLUCOSE 101* 91 118* 79  BUN 7 7 13  33*  CREATININE 0.48 0.44 0.62 0.65  CALCIUM 9.2 8.6* 9.4 9.0  MG  --   --   --  2.0   GFR: Estimated Creatinine Clearance: 37.9 mL/min (by C-G formula based on SCr of 0.65 mg/dL). Liver Function Tests:  Recent Labs Lab 07/02/16 2010  AST 23  ALT 17  ALKPHOS 42  BILITOT 0.6  PROT 6.2*  ALBUMIN 3.5    Recent Labs Lab 07/02/16 2010  LIPASE 40   No results for input(s): AMMONIA in the last 168 hours. Coagulation Profile:  Recent Labs Lab 07/02/16 2010  INR 1.01   Cardiac Enzymes: No results for input(s): CKTOTAL, CKMB, CKMBINDEX, TROPONINI in the last 168 hours. BNP (last 3 results) No results for input(s): PROBNP in the last 8760 hours. HbA1C: No results for input(s): HGBA1C in the last 72 hours. CBG: No results for input(s): GLUCAP in the last 168 hours. Lipid Profile: No results for input(s): CHOL, HDL, LDLCALC, TRIG, CHOLHDL, LDLDIRECT in the last 72 hours. Thyroid Function Tests:  Recent Labs  07/02/16 2010  TSH 4.127   Anemia Panel: No results for input(s): VITAMINB12, FOLATE, FERRITIN, TIBC, IRON, RETICCTPCT in the last 72 hours. Urine analysis:    Component Value Date/Time   COLORURINE YELLOW 07/02/2016 2142   APPEARANCEUR CLOUDY (A) 07/02/2016 2142   LABSPEC 1.015 07/02/2016 2142   PHURINE 7.5 07/02/2016 2142   GLUCOSEU NEGATIVE 07/02/2016 2142   HGBUR TRACE (A) 07/02/2016 2142   BILIRUBINUR NEGATIVE 07/02/2016 2142   KETONESUR NEGATIVE 07/02/2016 2142   PROTEINUR NEGATIVE 07/02/2016 2142   UROBILINOGEN 0.2 10/15/2014 0125   NITRITE NEGATIVE 07/02/2016 2142   LEUKOCYTESUR MODERATE (A) 07/02/2016 2142     ) Recent Results (from the past 240 hour(s))  Urine culture     Status: Abnormal    Collection Time: 06/30/16  4:15 PM  Result Value Ref Range Status   Specimen Description URINE, RANDOM  Final   Special Requests NONE  Final   Culture (A)  Final    <10,000 COLONIES/mL INSIGNIFICANT GROWTH Performed at [facility]    Report Status 07/02/2016 FINAL  Final  Urine culture     Status: Abnormal   Collection Time: 07/02/16  9:42 PM  Result Value Ref Range Status   Specimen Description Urine  Final   Special Requests NONE  Final   Culture <10,000 COLONIES/mL INSIGNIFICANT GROWTH (A)  Final   Report Status 07/04/2016 FINAL  Final  MRSA PCR Screening     Status: None   Collection Time: 07/04/16  5:23 AM  Result Value Ref Range Status   MRSA by PCR NEGATIVE NEGATIVE Final    Comment:        The GeneXpert MRSA Assay (FDA approved for NASAL specimens only), is one component of a comprehensive MRSA colonization surveillance program. It is not intended to diagnose MRSA infection nor to guide or monitor treatment for MRSA infections.       Anti-infectives    None       Radiology Studies: No results found.      Scheduled Meds: . aspirin EC  81 mg Oral Q breakfast  . famotidine  20 mg Oral BID  . Influenza vac split quadrivalent PF  0.5 mL Intramuscular Tomorrow-1000  . potassium chloride  10 mEq Intravenous Q1 Hr x 4  . QUEtiapine  12.5 mg Oral QHS  . sodium chloride flush  3 mL Intravenous Q12H   Continuous Infusions: . sodium chloride 50 mL/hr at 07/05/16 0820     LOS: 1 day    Time spent: 25 min    Clare, DO [facility] Pager [phone]  If 7PM-7AM, please contact night-coverage [email or web address] Password [email or web address] 07/05/2016, 1:30 PM

## 2016-07-06 DIAGNOSIS — I639 Cerebral infarction, unspecified: Secondary | ICD-10-CM

## 2016-07-06 LAB — CBC
HCT: 35.9 % — ABNORMAL LOW (ref 36.0–46.0)
Hemoglobin: 11.5 g/dL — ABNORMAL LOW (ref 12.0–15.0)
MCH: 28.5 pg (ref 26.0–34.0)
MCHC: 32 g/dL (ref 30.0–36.0)
MCV: 89.1 fL (ref 78.0–100.0)
PLATELETS: 341 10*3/uL (ref 150–400)
RBC: 4.03 MIL/uL (ref 3.87–5.11)
RDW: 14.4 % (ref 11.5–15.5)
WBC: 15.5 10*3/uL — ABNORMAL HIGH (ref 4.0–10.5)

## 2016-07-06 LAB — BASIC METABOLIC PANEL
Anion gap: 21 — ABNORMAL HIGH (ref 5–15)
BUN: 31 mg/dL — AB (ref 6–20)
CO2: 19 mmol/L — ABNORMAL LOW (ref 22–32)
CREATININE: 0.75 mg/dL (ref 0.44–1.00)
Calcium: 9.3 mg/dL (ref 8.9–10.3)
Chloride: 104 mmol/L (ref 101–111)
GFR calc Af Amer: >60 mL/min (ref >60)
Glucose, Bld: 100 mg/dL — ABNORMAL HIGH (ref 65–99)
POTASSIUM: 3.5 mmol/L (ref 3.5–5.1)
SODIUM: 144 mmol/L (ref 135–145)

## 2016-07-06 MED ORDER — RESOURCE THICKENUP CLEAR PO POWD: ORAL | Status: DC

## 2016-07-06 MED ORDER — RESOURCE THICKENUP CLEAR PO POWD
ORAL | Status: DC | PRN
  Filled 2016-07-06: qty 125

## 2016-07-06 MED ORDER — LIDOCAINE 5 % EX PTCH: 2.0000 | MEDICATED_PATCH | CUTANEOUS | 0 refills | Status: DC

## 2016-07-06 MED ORDER — ACETAMINOPHEN 325 MG PO TABS: 650.0000 mg | ORAL_TABLET | Freq: Four times a day (QID) | ORAL | Status: DC | PRN

## 2016-07-06 NOTE — Discharge Summary (Addendum)
Physician Discharge Summary  Whitney Marquez [id number] DOB: 23-Jul-1923 DOA: 07/02/2016  PCP: Mathews Argyle, MD  Admit date: 07/02/2016 Discharge date: 07/06/2016   Recommendations for Outpatient Follow-Up:   1. PT/OT/SLP follow up 2. To SNF 3. If patient fails to improve, will need palliative care/hospice eval 4. Wean O2 as tolerated   Discharge Diagnosis:   Principal Problem:   Acute metabolic encephalopathy Active Problems:   Essential hypertension   Delirium   Dehydration   History of recent fall   Right hip pain   Positive D-dimer   Aortic stenosis   Discharge disposition:   SNF:  Discharge Condition: Improved.  Diet recommendation: DYS 1, pudding thick  Wound care: None.   History of Present Illness:   Whitney Marquez is a 82 y.o. woman with a history of aortic stenosis, HTN, HLD, and breast and colon cancers who is a resident in an independent living facility.  She was admitted to the hospital 9/23-9/25 after presenting with confusion.  She had extensive stroke work-up and neurology was consulted.  MRI did not show acute CVA.  She was diagnosed with a UTI.  She was discharged on full strength Bactrim.  She was back in the ED on 9/29 after having a fall that resulted in a laceration over her right eye and extensive bruising involving most of her right face and neck.  She was diagnosed with UTI again and was discharged to home with a prescription for cipro 500mg  BID x 7 days.  Today, the RN evaluating her at her facility noted slurred speech, and left leg weakness.  The patient was sent back to the ED.    She has had ongoing right hip, leg pain since her fall.  Xrays done at her initial presentation were negative for acute fracture.She has not had any chest pain or shortness of breath.  She has had intermittent low blood pressures at her facility, but she has been asymptomatic.  No nausea or vomiting.  No leg swelling.   Hospital Course by Problem:    Acute metabolic encephalopathy due to polypharmacy (? cipro) --suspect due to cipro -minimize medications delirium protocol-- night/days -SLP eval- will need continued follow up -slowly improving-- if stalls or fails to improved- consider hospice  delayed swallowing- suspect related to AMS-- should improve over time -aspiration risk discussed with family- daughter and son as well as SLP Whitney Marquez.  Education provided by Whitney Marquez  History of aortic stenosis --Complete echo done last admission with stroke work-up.  Dehydration --s/p IVF  Essential hypertension -d/c meds as BP low  Positive D-Dimer of unclear clinical significance --I would not risk contrast exposure in the this patient at this time (she has an allergy to iodine, shellfish) and gave this explicit recommendation to the patient's family, who is in agreement. No clinical signs concerning for PE. U/S of legs negative  Debility --SNF placement  Hypokalemia -replace IV  -Mg ok  Arthritis -tylenol and lidocaine patched  Medical Consultants:    None.   Discharge Exam:   Vitals:   07/06/16 0500 07/06/16 0511  BP: 135/71 135/71  Pulse: (!) 113 (!) 113  Resp: 18 18  Temp: 98.4 F (36.9 C) 98.4 F (36.9 C)   Vitals:   07/05/16 2141 07/05/16 2146 07/06/16 0500 07/06/16 0511  BP: 137/75  135/71 135/71  Pulse: (!) 108 (!) 109 (!) 113 (!) 113  Resp: 19  18 18   Temp: 97.8 F (36.6 C)  98.4 F (36.9 C) 98.4  F (36.9 C)  TempSrc:   Oral   SpO2: (!) 89% 91% 92% 92%  Weight:      Height:        Gen:  More alert today   The results of significant diagnostics from this hospitalization (including imaging, microbiology, ancillary and laboratory) are listed below for reference.     Procedures and Diagnostic Studies:   Dg Chest 2 View  Result Date: 07/02/2016 CLINICAL DATA:  Fall 2 days ago with chest pain, initial encounter EXAM: CHEST  2 VIEW COMPARISON:  05/17/2016 FINDINGS: Cardiac shadow is  stable. Aortic calcifications are again seen. Elevation the right hemidiaphragm is noted. Some mild interstitial changes are again seen and stable. No pneumothorax or focal infiltrate is seen. Bony structures show no acute abnormality. IMPRESSION: No active cardiopulmonary disease. Electronically Signed   By: Inez Catalina M.D.   On: 07/02/2016 19:57   Ct Head Wo Contrast  Result Date: 07/02/2016 CLINICAL DATA:  Right-sided bruising after falling 2 days ago. Slurred speech. Generalized weakness. EXAM: CT HEAD WITHOUT CONTRAST TECHNIQUE: Contiguous axial images were obtained from the base of the skull through the vertex without intravenous contrast. COMPARISON:  Head CT 06/30/2016 and 06/24/2016. MRI brain 06/25/2016. FINDINGS: Despite efforts by the technologist and patient, motion artifact is present on today's exam and could not be eliminated. This reduces exam sensitivity and specificity. Brain: There is no evidence of acute intracranial hemorrhage, mass lesion, brain edema or new extra-axial fluid collection. There is stable diffuse prominence of the subarachnoid spaces, especially over the frontal lobes. No hydrocephalus. There is no CT evidence of acute cortical infarction. Stable chronic small vessel ischemic changes in the periventricular white matter. Vascular: Extensive intracranial vascular calcifications are again noted. Skull: Negative for fracture or focal lesion. Sinuses/Orbits: Periorbital swelling on the right appears improved. The visualized paranasal sinuses, mastoid air cells and middle ears are clear. Other: None. IMPRESSION: 1. Interval improvement in periorbital swelling/hematoma on the right. 2. No acute intracranial findings. 3. Stable atrophy and chronic small vessel ischemic changes. Electronically Signed   By: Richardean Sale M.D.   On: 07/02/2016 20:29   Mr Brain Ltd W/o Cm  Result Date: 07/03/2016 CLINICAL DATA:  82 y/o F; slurred speech, a aphasia, and right leg weakness. EXAM:  MRI HEAD WITHOUT CONTRAST TECHNIQUE: Sagittal T1, axial DWI, and coronal DWI sequences were acquired. The patient was unable to continue the exam. COMPARISON:  07/02/2016 CT head.  06/25/2016 MR head. FINDINGS: Motion degraded sagittal T1 and axial DWI sequences. No diffusion signal abnormality is identified. Hyperostosis frontalis interna. Moderate parenchymal volume loss. T1 hypo intense foci in subcortical and periventricular white matter as well as within the if corpus callosum are consistent with chronic microvascular ischemic changes and small lacunar infarcts. Mucous thickening within the sphenoid sinus. IMPRESSION: No diffusion signal abnormality to suggest acute/early subacute infarct. Electronically Signed   By: Kristine Garbe M.D.   On: 07/03/2016 00:22     Labs:   Basic Metabolic Panel:  Recent Labs Lab 07/03/16 0400 07/04/16 0507 07/05/16 0442 07/05/16 1607 07/06/16 0628  NA 132* 133* 136 138 144  K 3.4* 3.3* 2.9* 3.9 3.5  CL 101 95* 102 104 104  CO2 24 23 24  19* 19*  GLUCOSE 91 118* 79 82 100*  BUN 7 13 33* 34* 31*  CREATININE 0.44 0.62 0.65 0.71 0.75  CALCIUM 8.6* 9.4 9.0 9.1 9.3  MG  --   --  2.0  --   --  GFR Estimated Creatinine Clearance: 37.9 mL/min (by C-G formula based on SCr of 0.75 mg/dL). Liver Function Tests:  Recent Labs Lab 07/02/16 2010  AST 23  ALT 17  ALKPHOS 42  BILITOT 0.6  PROT 6.2*  ALBUMIN 3.5    Recent Labs Lab 07/02/16 2010  LIPASE 40   No results for input(s): AMMONIA in the last 168 hours. Coagulation profile  Recent Labs Lab 07/02/16 2010  INR 1.01    CBC:  Recent Labs Lab 07/02/16 2010 07/03/16 0400 07/04/16 0507 07/05/16 0442 07/06/16 0628  WBC 11.5* 9.5 16.5* 12.4* 15.5*  NEUTROABS 7.9*  --   --   --   --   HGB 11.8* 11.1* 12.7 11.2* 11.5*  HCT 36.2 33.3* 38.5 34.8* 35.9*  MCV 87.9 86.7 86.3 88.3 89.1  PLT 268 285 358 337 341   Cardiac Enzymes: No results for input(s): CKTOTAL, CKMB,  CKMBINDEX, TROPONINI in the last 168 hours. BNP: Invalid input(s): POCBNP CBG: No results for input(s): GLUCAP in the last 168 hours. D-Dimer No results for input(s): DDIMER in the last 72 hours. Hgb A1c No results for input(s): HGBA1C in the last 72 hours. Lipid Profile No results for input(s): CHOL, HDL, LDLCALC, TRIG, CHOLHDL, LDLDIRECT in the last 72 hours. Thyroid function studies No results for input(s): TSH, T4TOTAL, T3FREE, THYROIDAB in the last 72 hours.  Invalid input(s): FREET3 Anemia work up No results for input(s): VITAMINB12, FOLATE, FERRITIN, TIBC, IRON, RETICCTPCT in the last 72 hours. Microbiology Recent Results (from the past 240 hour(s))  Urine culture     Status: Abnormal   Collection Time: 06/30/16  4:15 PM  Result Value Ref Range Status   Specimen Description URINE, RANDOM  Final   Special Requests NONE  Final   Culture (A)  Final    <10,000 COLONIES/mL INSIGNIFICANT GROWTH Performed at [facility]    Report Status 07/02/2016 FINAL  Final  Urine culture     Status: Abnormal   Collection Time: 07/02/16  9:42 PM  Result Value Ref Range Status   Specimen Description Urine  Final   Special Requests NONE  Final   Culture <10,000 COLONIES/mL INSIGNIFICANT GROWTH (A)  Final   Report Status 07/04/2016 FINAL  Final  MRSA PCR Screening     Status: None   Collection Time: 07/04/16  5:23 AM  Result Value Ref Range Status   MRSA by PCR NEGATIVE NEGATIVE Final    Comment:        The GeneXpert MRSA Assay (FDA approved for NASAL specimens only), is one component of a comprehensive MRSA colonization surveillance program. It is not intended to diagnose MRSA infection nor to guide or monitor treatment for MRSA infections.      Discharge Instructions:   Discharge Instructions    Discharge instructions    Complete by:  As directed    DNR DYS 1 diet with pudding thick-- known aspiration risk   Increase activity slowly    Complete by:  As directed         Medication List    STOP taking these medications   amLODipine 5 MG tablet Commonly known as:  NORVASC   CENTRUM SILVER PO   ciprofloxacin 500 MG tablet Commonly known as:  CIPRO   fenofibrate 54 MG tablet   FIRST AID ANTIBIOTIC EX   HYDROcodone-acetaminophen 5-325 MG tablet Commonly known as:  NORCO/VICODIN     TAKE these medications   acetaminophen 325 MG tablet Commonly known as:  TYLENOL Take 2  tablets (650 mg total) by mouth every 6 (six) hours as needed for mild pain (or Fever >/= 101).   aspirin 81 MG tablet Take 81 mg by mouth daily with breakfast.   Calcium-Vitamin D-Vitamin K 500-100-40 MG-UNT-MCG Chew Chew 1 tablet by mouth 3 (three) times daily with meals.   lidocaine 5 % Commonly known as:  LIDODERM Place 2 patches onto the skin daily. Remove & Discard patch within 12 hours or as directed by MD (knee and back as needed for pain)   omeprazole 20 MG capsule Commonly known as:  PRILOSEC Take 20 mg by mouth daily.   REFRESH OP Apply 1-2 drops to eye at bedtime as needed (for dry eyes).   RESOURCE THICKENUP CLEAR Powd Pudding thick liquids      Follow-up [place], MD Follow up in 1 week(s).   Specialty:  Internal Medicine Contact information: [address] [place] [address] [phone]            Time coordinating discharge: 35 min  Signed:  Khushbu Pippen Alison Stalling   [facility] 07/06/2016, 1:31 PM

## 2016-07-06 NOTE — Progress Notes (Signed)
PT Cancellation Note  Patient Details Name: XOLA BEITER MRN: [id number] DOB: 1923-07-27   Cancelled Treatment:    Reason Eval/Treat Not Completed: Other (comment) (leaving imminently for SNF).   Ramond Dial 07/06/2016, 1:47 PM    Mee Hives, PT MS Acute Rehab Dept. Number: [place] and [place]

## 2016-07-06 NOTE — Progress Notes (Signed)
Patient will DC to: [facility] Anticipated DC date: 07/06/16 Family notified: Daughter, Pensions consultant by: Corey Harold   Per MD patient ready for DC to [facility] SNF. RN, patient, patient's family, and facility notified of DC. Discharge Summary sent to facility. RN given number for report. DC packet on chart. Ambulance transport requested for patient.   CSW signing off.  Cedric Fishman, Guy Social Worker [phone]

## 2016-07-06 NOTE — Progress Notes (Signed)
Speech Language Pathology Treatment: Dysphagia  Patient Details Name: Whitney Marquez MRN: [id number] DOB: 1922-10-21 Today's Date: 07/06/2016 Time: [id number] SLP Time Calculation (min) (ACUTE ONLY): 43 min  Assessment / Plan / Recommendation Clinical Impression  Pt demonstrates improving attention and arousal this am. Lingual mobility much improved after extensive oral care completed and dried secretions removed from mouth. Pts daughter and son are present and observed pt to require max cues for awareness of PO, oral holding and cues needed to trigger swallow. Signs of aspiration seen with thin liquids due to decreased oral control of bolus, suspect premature spillage to pharynx with reflexive swallow. Pt did not cough though aspiration suspected. Discussed high risk of aspiration with pts family and questionable ability to take consistent nutrition. They are willing to accept risk of aspiration and attempt  Ice chips after oral care, puree and pudding thick liquids so that pt may d/c back to familiar environment. Discussed positioning, oral care, cueing and feeding techniques. SLP at next level of care to continue therapeutic interventions.     HPI HPI: Whitney B Holmesis a 82 y.o.woman with a history of aortic stenosis, HTN, HLD, and breast and colon cancers who is a resident in an independent living facility. She was admitted to the hospital 9/23-9/25 after presenting with confusion. She had extensive stroke work-up and neurology was consulted. MRI did not show acute CVA. She was diagnosed with a UTI. She was discharged on full strength Bactrim. She was back in the ED on 9/29 after having a fall that resulted in a laceration over her right eye and extensive bruising involving most of her right face and neck. She was diagnosed with UTI again and was discharged to home with a prescription for cipro 500mg  BID x 7 days. She was re-admitted with RN noted slurred speech and left leg weakness. Dx  with acute metabolic encephalopathy due to polypharmacy, recent UTI.       SLP Plan  Continue with current plan of care     Recommendations  Diet recommendations: Dysphagia 1 (puree);Pudding-thick liquid Liquids provided via: Cup;Teaspoon Supervision: Full supervision/cueing for compensatory strategies Compensations: Slow rate;Small sips/bites                Oral Care Recommendations: Oral care prior to ice chip/H20;Staff/trained caregiver to provide oral care Follow up Recommendations: Skilled Nursing facility Plan: Continue with current plan of care       GO               [facility], MA CCC-SLP [id number]  Lynann Beaver 07/06/2016, 10:30 AM

## 2016-07-06 NOTE — Progress Notes (Signed)
NURSING PROGRESS NOTE  LALEH MORGANELLI [id number] Discharge Data: 07/06/2016 2:16 PM Attending Provider: Geradine Girt, DO [id number] THOMAS, MD     Marney Setting to be D/C'd to [facility] facility per MD order. All IV's discontinued with no bleeding noted. Reported called to [place] at [place].  Last Vital Signs:  Blood pressure 129/69, pulse (!) 102, temperature 97.4 F (36.3 C), temperature source Oral, resp. rate 18, height 5\' 3"  (1.6 m), weight 55.8 kg (123 lb), SpO2 96 %.  Discharge Medication List   Medication List    STOP taking these medications   amLODipine 5 MG tablet Commonly known as:  NORVASC   CENTRUM SILVER PO   ciprofloxacin 500 MG tablet Commonly known as:  CIPRO   fenofibrate 54 MG tablet   FIRST AID ANTIBIOTIC EX   HYDROcodone-acetaminophen 5-325 MG tablet Commonly known as:  NORCO/VICODIN     TAKE these medications   acetaminophen 325 MG tablet Commonly known as:  TYLENOL Take 2 tablets (650 mg total) by mouth every 6 (six) hours as needed for mild pain (or Fever >/= 101).   aspirin 81 MG tablet Take 81 mg by mouth daily with breakfast.   Calcium-Vitamin D-Vitamin K 500-100-40 MG-UNT-MCG Chew Chew 1 tablet by mouth 3 (three) times daily with meals.   lidocaine 5 % Commonly known as:  LIDODERM Place 2 patches onto the skin daily. Remove & Discard patch within 12 hours or as directed by MD (knee and back as needed for pain)   omeprazole 20 MG capsule Commonly known as:  PRILOSEC Take 20 mg by mouth daily.   REFRESH OP Apply 1-2 drops to eye at bedtime as needed (for dry eyes).   RESOURCE THICKENUP CLEAR Powd Pudding thick liquids

## 2016-07-07 ENCOUNTER — Non-Acute Institutional Stay (SKILLED_NURSING_FACILITY): Payer: Medicare Other | Admitting: Adult Health

## 2016-07-07 ENCOUNTER — Emergency Department (HOSPITAL_COMMUNITY): Payer: Medicare Other

## 2016-07-07 ENCOUNTER — Inpatient Hospital Stay (HOSPITAL_COMMUNITY)
Admission: EM | Admit: 2016-07-07 | Discharge: 2016-07-11 | DRG: 640 | Disposition: A | Discharge status: Skilled Nursing Facility | Payer: Medicare Other | Attending: Family Medicine | Admitting: Family Medicine

## 2016-07-07 ENCOUNTER — Encounter: Payer: Self-pay | Admitting: Adult Health

## 2016-07-07 DIAGNOSIS — G9608 Other cranial cerebrospinal fluid leak: Secondary | ICD-10-CM | POA: Diagnosis present

## 2016-07-07 DIAGNOSIS — F0391 Unspecified dementia with behavioral disturbance: Secondary | ICD-10-CM

## 2016-07-07 DIAGNOSIS — F03918 Unspecified dementia, unspecified severity, with other behavioral disturbance: Secondary | ICD-10-CM

## 2016-07-07 DIAGNOSIS — R1312 Dysphagia, oropharyngeal phase: Secondary | ICD-10-CM

## 2016-07-07 DIAGNOSIS — R000 Tachycardia, unspecified: Secondary | ICD-10-CM

## 2016-07-07 DIAGNOSIS — G960 Cerebrospinal fluid leak: Secondary | ICD-10-CM

## 2016-07-07 DIAGNOSIS — Z7982 Long term (current) use of aspirin: Secondary | ICD-10-CM

## 2016-07-07 DIAGNOSIS — Z880 Allergy status to penicillin: Secondary | ICD-10-CM

## 2016-07-07 DIAGNOSIS — D649 Anemia, unspecified: Secondary | ICD-10-CM | POA: Diagnosis present

## 2016-07-07 DIAGNOSIS — E785 Hyperlipidemia, unspecified: Secondary | ICD-10-CM | POA: Diagnosis present

## 2016-07-07 DIAGNOSIS — E860 Dehydration: Secondary | ICD-10-CM | POA: Diagnosis present

## 2016-07-07 DIAGNOSIS — M419 Scoliosis, unspecified: Secondary | ICD-10-CM | POA: Diagnosis present

## 2016-07-07 DIAGNOSIS — G9389 Other specified disorders of brain: Secondary | ICD-10-CM | POA: Diagnosis present

## 2016-07-07 DIAGNOSIS — R0602 Shortness of breath: Secondary | ICD-10-CM

## 2016-07-07 DIAGNOSIS — R4182 Altered mental status, unspecified: Secondary | ICD-10-CM | POA: Diagnosis not present

## 2016-07-07 DIAGNOSIS — Z91013 Allergy to seafood: Secondary | ICD-10-CM

## 2016-07-07 DIAGNOSIS — Z66 Do not resuscitate: Secondary | ICD-10-CM | POA: Diagnosis present

## 2016-07-07 DIAGNOSIS — R1310 Dysphagia, unspecified: Secondary | ICD-10-CM | POA: Diagnosis present

## 2016-07-07 DIAGNOSIS — I10 Essential (primary) hypertension: Secondary | ICD-10-CM

## 2016-07-07 DIAGNOSIS — Z79899 Other long term (current) drug therapy: Secondary | ICD-10-CM

## 2016-07-07 DIAGNOSIS — Z888 Allergy status to other drugs, medicaments and biological substances status: Secondary | ICD-10-CM

## 2016-07-07 DIAGNOSIS — R339 Retention of urine, unspecified: Secondary | ICD-10-CM | POA: Diagnosis present

## 2016-07-07 DIAGNOSIS — Z7189 Other specified counseling: Secondary | ICD-10-CM

## 2016-07-07 DIAGNOSIS — Z515 Encounter for palliative care: Secondary | ICD-10-CM | POA: Diagnosis present

## 2016-07-07 DIAGNOSIS — R7989 Other specified abnormal findings of blood chemistry: Secondary | ICD-10-CM

## 2016-07-07 DIAGNOSIS — Z85038 Personal history of other malignant neoplasm of large intestine: Secondary | ICD-10-CM

## 2016-07-07 DIAGNOSIS — Z8249 Family history of ischemic heart disease and other diseases of the circulatory system: Secondary | ICD-10-CM

## 2016-07-07 DIAGNOSIS — D72829 Elevated white blood cell count, unspecified: Secondary | ICD-10-CM | POA: Diagnosis present

## 2016-07-07 DIAGNOSIS — Z853 Personal history of malignant neoplasm of breast: Secondary | ICD-10-CM

## 2016-07-07 DIAGNOSIS — S0181XA Laceration without foreign body of other part of head, initial encounter: Secondary | ICD-10-CM | POA: Diagnosis not present

## 2016-07-07 DIAGNOSIS — F0390 Unspecified dementia without behavioral disturbance: Secondary | ICD-10-CM | POA: Diagnosis present

## 2016-07-07 DIAGNOSIS — Z886 Allergy status to analgesic agent status: Secondary | ICD-10-CM

## 2016-07-07 DIAGNOSIS — G9341 Metabolic encephalopathy: Secondary | ICD-10-CM | POA: Diagnosis not present

## 2016-07-07 DIAGNOSIS — E870 Hyperosmolality and hypernatremia: Secondary | ICD-10-CM | POA: Diagnosis present

## 2016-07-07 DIAGNOSIS — J9621 Acute and chronic respiratory failure with hypoxia: Secondary | ICD-10-CM

## 2016-07-07 DIAGNOSIS — Z881 Allergy status to other antibiotic agents status: Secondary | ICD-10-CM

## 2016-07-07 DIAGNOSIS — I350 Nonrheumatic aortic (valve) stenosis: Secondary | ICD-10-CM

## 2016-07-07 DIAGNOSIS — R778 Other specified abnormalities of plasma proteins: Secondary | ICD-10-CM | POA: Diagnosis present

## 2016-07-07 LAB — I-STAT ARTERIAL BLOOD GAS, ED
ACID-BASE DEFICIT: 4 mmol/L — AB (ref 0.0–2.0)
BICARBONATE: 23.9 mmol/L (ref 20.0–28.0)
O2 Saturation: 88 %
PO2 ART: 65 mmHg — AB (ref 83.0–108.0)
Patient temperature: 98.6
TCO2: 26 mmol/L (ref 0–100)
pCO2 arterial: 54.6 mmHg — ABNORMAL HIGH (ref 32.0–48.0)
pH, Arterial: 7.249 — ABNORMAL LOW (ref 7.350–7.450)

## 2016-07-07 LAB — CBC WITH DIFFERENTIAL/PLATELET
BASOS PCT: 0 %
Basophils Absolute: 0 10*3/uL (ref 0.0–0.1)
Eosinophils Absolute: 0 10*3/uL (ref 0.0–0.7)
Eosinophils Relative: 0 %
HEMATOCRIT: 40.2 % (ref 36.0–46.0)
HEMOGLOBIN: 12.7 g/dL (ref 12.0–15.0)
LYMPHS ABS: 1.7 10*3/uL (ref 0.7–4.0)
LYMPHS PCT: 11 %
MCH: 28.9 pg (ref 26.0–34.0)
MCHC: 31.6 g/dL (ref 30.0–36.0)
MCV: 91.6 fL (ref 78.0–100.0)
MONO ABS: 1.8 10*3/uL — AB (ref 0.1–1.0)
MONOS PCT: 12 %
NEUTROS ABS: 11.5 10*3/uL — AB (ref 1.7–7.7)
NEUTROS PCT: 77 %
Platelets: 314 10*3/uL (ref 150–400)
RBC: 4.39 MIL/uL (ref 3.87–5.11)
RDW: 14.8 % (ref 11.5–15.5)
WBC: 15.1 10*3/uL — ABNORMAL HIGH (ref 4.0–10.5)

## 2016-07-07 LAB — COMPREHENSIVE METABOLIC PANEL
ALBUMIN: 3.6 g/dL (ref 3.5–5.0)
ALK PHOS: 50 U/L (ref 38–126)
ALT: 38 U/L (ref 14–54)
ANION GAP: 15 (ref 5–15)
AST: 46 U/L — ABNORMAL HIGH (ref 15–41)
BUN: 47 mg/dL — ABNORMAL HIGH (ref 6–20)
CALCIUM: 10.3 mg/dL (ref 8.9–10.3)
CHLORIDE: 113 mmol/L — AB (ref 101–111)
CO2: 24 mmol/L (ref 22–32)
Creatinine, Ser: 0.86 mg/dL (ref 0.44–1.00)
GFR calc non Af Amer: 57 mL/min — ABNORMAL LOW (ref >60)
GLUCOSE: 109 mg/dL — AB (ref 65–99)
POTASSIUM: 3.6 mmol/L (ref 3.5–5.1)
SODIUM: 152 mmol/L — AB (ref 135–145)
Total Bilirubin: 1.2 mg/dL (ref 0.3–1.2)
Total Protein: 6.7 g/dL (ref 6.5–8.1)

## 2016-07-07 LAB — TROPONIN I: Troponin I: 0.15 ng/mL (ref <0.03)

## 2016-07-07 LAB — I-STAT CG4 LACTIC ACID, ED: LACTIC ACID, VENOUS: 1.38 mmol/L (ref 0.5–1.9)

## 2016-07-07 LAB — BRAIN NATRIURETIC PEPTIDE: B NATRIURETIC PEPTIDE 5: 364 pg/mL — AB (ref 0.0–100.0)

## 2016-07-07 LAB — CBG MONITORING, ED: Glucose-Capillary: 106 mg/dL — ABNORMAL HIGH (ref 65–99)

## 2016-07-07 MED ORDER — LIDOCAINE 5 % EX PTCH
2.0000 | MEDICATED_PATCH | Freq: Every day | CUTANEOUS | Status: DC | PRN
  Administered 2016-07-08: 1 via TRANSDERMAL
  Administered 2016-07-11: 2 via TRANSDERMAL
  Filled 2016-07-07 (×4): qty 2

## 2016-07-07 MED ORDER — ACETAMINOPHEN 325 MG PO TABS: 650.0000 mg | ORAL_TABLET | Freq: Four times a day (QID) | ORAL | Status: DC | PRN

## 2016-07-07 MED ORDER — KCL IN DEXTROSE-NACL 20-5-0.45 MEQ/L-%-% IV SOLN
INTRAVENOUS | Status: DC
  Administered 2016-07-07 – 2016-07-08 (×3): via INTRAVENOUS
  Administered 2016-07-09: 1000 mL via INTRAVENOUS
  Filled 2016-07-07 (×7): qty 1000

## 2016-07-07 MED ORDER — SODIUM CHLORIDE 0.9 % IV BOLUS (SEPSIS)
1000.0000 mL | Freq: Once | INTRAVENOUS | Status: AC
  Administered 2016-07-07: 1000 mL via INTRAVENOUS

## 2016-07-07 MED ORDER — ASPIRIN EC 81 MG PO TBEC
81.0000 mg | DELAYED_RELEASE_TABLET | Freq: Every day | ORAL | Status: DC
  Administered 2016-07-11: 81 mg via ORAL
  Filled 2016-07-07 (×2): qty 1

## 2016-07-07 MED ORDER — POLYVINYL ALCOHOL 1.4 % OP SOLN
2.0000 [drp] | Freq: Three times a day (TID) | OPHTHALMIC | Status: DC
  Administered 2016-07-07 – 2016-07-11 (×10): 2 [drp] via OPHTHALMIC
  Filled 2016-07-07 (×2): qty 15

## 2016-07-07 MED ORDER — POLYVINYL ALCOHOL 1.4 % OP SOLN
2.0000 [drp] | OPHTHALMIC | Status: DC | PRN
  Filled 2016-07-07: qty 15

## 2016-07-07 MED ORDER — PANTOPRAZOLE SODIUM 40 MG PO TBEC
40.0000 mg | DELAYED_RELEASE_TABLET | Freq: Every day | ORAL | Status: DC
  Administered 2016-07-11: 40 mg via ORAL
  Filled 2016-07-07 (×2): qty 1

## 2016-07-07 MED ORDER — SODIUM CHLORIDE 0.9% FLUSH
3.0000 mL | Freq: Two times a day (BID) | INTRAVENOUS | Status: DC
  Administered 2016-07-07 – 2016-07-10 (×2): 3 mL via INTRAVENOUS

## 2016-07-07 MED ORDER — CALCIUM CARBONATE-VITAMIN D 500-200 MG-UNIT PO TABS
1.0000 | ORAL_TABLET | Freq: Three times a day (TID) | ORAL | Status: DC
  Administered 2016-07-10 – 2016-07-11 (×3): 1 via ORAL
  Filled 2016-07-07 (×4): qty 1

## 2016-07-07 MED ORDER — CALCIUM-VITAMIN D-VITAMIN K 500-100-40 MG-UNT-MCG PO CHEW: 1.0000 | CHEWABLE_TABLET | Freq: Three times a day (TID) | ORAL | Status: DC

## 2016-07-07 NOTE — ED Triage Notes (Signed)
Pt presents from [facility] SNF with reports of not eating.  Per EMS, pt was discharged from here yesterday after a fall, staff told EMS that pt hasn't eaten since yesterday.

## 2016-07-07 NOTE — ED Provider Notes (Signed)
[facility] DEPT Provider Note   CSN: [id number] Arrival date & time: 07/07/16  1418     History   Chief Complaint Chief Complaint  Patient presents with  . Altered Mental Status   Level V caveat: Altered mental status  HPI Whitney Marquez is a 82 y.o. female.  The history is provided by medical records, the EMS personnel and a relative.   Patient is discharged from hospital service yesterday for altered mental status and metabolic encephalopathy.  She presents back today with worsening symptoms.  Family reports there is no clear etiology found for her symptoms and are concerned that she was a rather functional lady up to 2 weeks ago.  She did have a significant fall one week ago and has had CT and MRI of the brain without acute traumatic or intercranial abnormality noted.  Rectal temp of 100.9 on arrival.  Reported significant decreased oral intake.   Past Medical History:  Diagnosis Date  . Aortic valve stenosis   . Arthritis   . Cancer [facility])    breast and colon  . Cancer ([place])   . Cataract   . Cataracts, both eyes   . Hypertension   . Plantar keratosis   . Scoliosis     Patient Active Problem List   Diagnosis Date Noted  . Cerebrovascular accident (CVA) ([place])   . Delirium 07/03/2016  . Acute metabolic encephalopathy 99991111  . Dehydration 07/03/2016  . History of recent fall 07/03/2016  . Right hip pain 07/03/2016  . Positive D-dimer 07/03/2016  . Aortic stenosis 07/03/2016  . Difficulty walking   . Muscle weakness (generalized)   . Confusion 06/24/2016  . CVA (cerebral infarction) 06/24/2016  . Lower urinary tract infectious disease 06/24/2016  . SIRS (systemic inflammatory response syndrome) ([place]) 10/20/2014  . Acute ischemic colitis ([place]) 10/19/2014  . Compression fracture of L1 lumbar vertebra with routine healing 10/19/2014  . Hypokalemia   . Anemia 10/15/2014  . Essential hypertension 10/15/2014  . Hyperlipidemia 10/15/2014    Past Surgical  History:  Procedure Laterality Date  . BACK SURGERY    . BREAST SURGERY     breast cancer  . COLON SURGERY     colon cancer  . COLON SURGERY    . LUMBAR LAMINECTOMY    . meniscus tear    . TOE AMPUTATION     second toe    OB History    Gravida Para Term Preterm AB Living   0 0 0 0 0     SAB TAB Ectopic Multiple Live Births   0 0 0           Home Medications    Prior to Admission medications   Medication Sig Start Date End Date Taking? Authorizing Provider  acetaminophen (TYLENOL) 325 MG tablet Take 2 tablets (650 mg total) by mouth every 6 (six) hours as needed for mild pain (or Fever >/= 101). 07/06/16  Yes Geradine Girt, DO  Artificial Saliva (BIOTENE DRY MOUTH MOISTURIZING) SOLN See admin instructions. 1-2 sprays into the mouth three times a day   Yes Historical Provider, MD  aspirin 81 MG tablet Take 81 mg by mouth daily with breakfast.    Yes Historical Provider, MD  Calcium-Vitamin D-Vitamin K 500-100-40 MG-UNT-MCG CHEW Chew 1 tablet by mouth 3 (three) times daily with meals.    Yes Historical Provider, MD  Hypromellose (ARTIFICIAL TEARS) 0.4 % SOLN Apply 2 drops to eye 3 (three) times daily.   Yes Historical Provider,  MD  lidocaine (LIDODERM) 5 % Place 2 patches onto the skin daily. Remove & Discard patch within 12 hours or as directed by MD (knee and back as needed for pain) Patient taking differently: Place 1 patch onto the skin daily. Remove & Discard patch within 12 hours or as directed by MD (apply to affected knee) 07/06/16  Yes Geradine Girt, DO  omeprazole (PRILOSEC) 20 MG capsule Take 20 mg by mouth daily.   Yes Historical Provider, MD  Polyvinyl Alcohol-Povidone (REFRESH OP) Apply 2 drops to eye at bedtime as needed (for dry eyes).    Yes Historical Provider, MD  Maltodextrin-Xanthan Gum (RESOURCE THICKENUP CLEAR) POWD Pudding thick liquids Patient not taking: Reported on 07/07/2016 07/06/16   Geradine Girt, DO    Family History History reviewed. No pertinent  family history.  Social History Social History  Substance Use Topics  . Smoking status: Never Smoker  . Smokeless tobacco: Never Used  . Alcohol use No     Allergies   Cephalosporins; Ciprofloxacin; Other; Pyridium [phenazopyridine hcl]; Aleve [naproxen]; Iodine; Macrobid [nitrofurantoin monohyd macro]; Penicillins; and Shellfish allergy   Review of Systems Review of Systems  Unable to perform ROS: Mental status change     Physical Exam Updated Vital Signs BP 138/84 (BP Location: Right Arm)   Pulse 105   Temp 100.9 F (38.3 C) (Rectal)   Resp 20   SpO2 97%   Physical Exam  Constitutional:  Chronically ill-appearing.  Agonal  HENT:  Significant bruising and swelling of the right side of her face and periorbital region with aged bruises  Eyes: Pupils are equal, round, and reactive to light.  Neck: Normal range of motion. Neck supple. No tracheal deviation present.  Cardiovascular: Regular rhythm.   Tachycardic.  Significant systolic ejection murmur  Pulmonary/Chest: No stridor. She has rales.  Tachypnea  Abdominal: Soft. She exhibits no distension. There is no tenderness.  Musculoskeletal: She exhibits no deformity.  Neurological:  Follows only very simple commands.  Grimaces to pain.  Does not grip hands bilaterally under command but is able to keep her left arm up against gravity for 2-3 seconds.    Skin: Skin is warm and dry.  Psychiatric:  Unable to evaluate  Nursing note and vitals reviewed.    ED Treatments / Results  Labs (all labs ordered are listed, but only abnormal results are displayed) Labs Reviewed  COMPREHENSIVE METABOLIC PANEL - Abnormal; Notable for the following:       Result Value   Sodium 152 (*)    Chloride 113 (*)    Glucose, Bld 109 (*)    BUN 47 (*)    AST 46 (*)    GFR calc non Af Amer 57 (*)    All other components within normal limits  CBC WITH DIFFERENTIAL/PLATELET - Abnormal; Notable for the following:    WBC 15.1 (*)     Neutro Abs 11.5 (*)    Monocytes Absolute 1.8 (*)    All other components within normal limits  TROPONIN I - Abnormal; Notable for the following:    Troponin I 0.15 (*)    All other components within normal limits  BRAIN NATRIURETIC PEPTIDE - Abnormal; Notable for the following:    B Natriuretic Peptide 364.0 (*)    All other components within normal limits  CBG MONITORING, ED - Abnormal; Notable for the following:    Glucose-Capillary 106 (*)    All other components within normal limits  I-STAT ARTERIAL BLOOD GAS,  ED - Abnormal; Notable for the following:    pH, Arterial 7.249 (*)    pCO2 arterial 54.6 (*)    pO2, Arterial 65.0 (*)    Acid-base deficit 4.0 (*)    All other components within normal limits  CULTURE, BLOOD (ROUTINE X 2)  CULTURE, BLOOD (ROUTINE X 2)  URINE CULTURE  URINALYSIS, ROUTINE W REFLEX MICROSCOPIC (NOT AT [facility])  I-STAT CG4 LACTIC ACID, ED    EKG  EKG Interpretation  Date/Time:  Friday July 07 2016 17:11:12 EDT Ventricular Rate:  107 PR Interval:    QRS Duration: 86 QT Interval:  324 QTC Calculation: 433 R Axis:   -21 Text Interpretation:  Sinus tachycardia Left ventricular hypertrophy Anterior Q waves, possibly due to LVH abnormal  ST segment isolated to lead V2 changed from prior ecg Confirmed by Kassaundra Hair  MD, Toiya Morrish ([id number]) on 07/07/2016 5:18:53 PM       Radiology Ct Head Wo Contrast  Result Date: 07/07/2016 CLINICAL DATA:  Altered mental status. Patient unresponsive. Hypoxia. Recent fall. EXAM: CT HEAD WITHOUT CONTRAST TECHNIQUE: Contiguous axial images were obtained from the base of the skull through the vertex without intravenous contrast. COMPARISON:  CT head 07/02/2016.  Initial CT head 06/30/2016. FINDINGS: Brain: Global atrophy. Small vessel disease. No hydrocephalus or intra-axial mass lesion. No features are seen to confirm cerebral infarction or anoxic injury. Increasing BILATERAL extra-axial CSF like fluid collections consistent with  hygromas. 13 mm thick on the RIGHT and 12 mm thick on the LEFT, significant progression from most recent study 5 days ago. Sulcal flattening is present, without herniation. Vascular: No hyperdense vessel or unexpected calcification. Skull: Normal. Negative for fracture or focal lesion. RIGHT frontal scalp hematoma redemonstrated. Sinuses/Orbits: No acute finding. Other: None. IMPRESSION: Significant increase in BILATERAL subdural hygromas since 06/30/16 and 07/02/2016. LEFT untreated, increased intracranial pressure or herniation could occur. Depending on the patient's clinical condition, neurosurgical consultation may be warranted. These results were called by telephone at the time of interpretation on 07/07/2016 at 5:29 pm to Dr. Jola Schmidt , who verbally acknowledged these results. Electronically Signed   By: Staci Righter M.D.   On: 07/07/2016 17:31   Dg Chest Portable 1 View  Result Date: 07/07/2016 CLINICAL DATA:  Shortness of breath. EXAM: PORTABLE CHEST 1 VIEW COMPARISON:  07/02/2016 FINDINGS: Chronic elevation of the right hemidiaphragm. Heart and mediastinum are stable. Aortic arch has atherosclerotic calcifications. Surgical clips in the right axilla. Subtle interstitial densities throughout both lungs could represent mild edema versus chronic change. Negative for a pneumothorax. IMPRESSION: Question mild interstitial edema. Stable elevation of the right hemidiaphragm. Aortic atherosclerosis. Electronically Signed   By: Markus Daft M.D.   On: 07/07/2016 15:57    Procedures Procedures (including critical care time)   ++++++++++++++++++++++++++++++++++++++++++++++++++++++  CRITICAL CARE Performed by: Hoy Morn Total critical care time: 43 minutes Critical care time was exclusive of separately billable procedures and treating other patients. Critical care was necessary to treat or prevent imminent or life-threatening deterioration. Critical care was time spent personally by me on the  following activities: development of treatment plan with patient and/or surrogate as well as nursing, discussions with consultants, evaluation of patient's response to treatment, examination of patient, obtaining history from patient or surrogate, ordering and performing treatments and interventions, ordering and review of laboratory studies, ordering and review of radiographic studies, pulse oximetry and re-evaluation of patient's condition.  +++++++++++++++++++++++++++++++++++++++++++++++++++++++++++++    Medications Ordered in ED Medications  sodium chloride 0.9 % bolus 1,000 mL (0  mLs Intravenous Stopped 07/07/16 1729)  sodium chloride 0.9 % bolus 1,000 mL (1,000 mLs Intravenous New Bag/Given 07/07/16 1630)     Initial Impression / Assessment and Plan / ED Course  I have reviewed the triage vital signs and the nursing notes.  Pertinent labs & imaging results that were available during my care of the patient were reviewed by me and considered in my medical decision making (see chart for details).  Clinical Course    I discussed the case with Dr. Ronnald Ramp of neurosurgery who agrees that with her severe aortic stenosis, hypoxia, advanced age, decline over the past several weeks that she is not a great surgical candidate.  He is available for repeat consultation as needed.  I had a long discussion with patient regarding goals of care.  Family will need palliative care consultation to better define these goals of care.  At this time they do not want heroic measures.  They will continue with supportive care of IV fluids and supporting her other organs.  No intubation.  No CPR.  Family understands the severity of this illness and process understands the multiple organ failure that her mother is currently in.  Patient be admitted to the medical service  Final Clinical Impressions(s) / ED Diagnoses   Final diagnoses:  Altered mental status, unspecified altered mental status type  Subdural hygroma    Acute on chronic respiratory failure with hypoxia [facility])    New Prescriptions New Prescriptions   No medications on file     Jola Schmidt, MD 07/07/16 1945

## 2016-07-07 NOTE — ED Notes (Signed)
Spoke with pt's daughter, Altha Harm who is HCPOA and verbally reports that pt is NOT a DNR, even though paperwork from [employer]

## 2016-07-07 NOTE — Progress Notes (Signed)
Patient ID: Whitney Marquez, female   DOB: 09-14-1923, 82 y.o.   MRN: [id number] I discussed with her daughter, Raquel Sarna, the patient's DNR/DNI status. After explaining to her that in the patient's present condition we would have to transfer the patient to the ICU/SDU area to provide full care, including possible ET intubation and/or CPR, instead of hydration and comfort care. She understands better now what her DNR vs full code status means and does not wish to change code status.   She spoke to her sister on the phone who stated that she and their brother are in agreement with just providing non-aggressive/non -heroic management.   Tennis Must, MD [phone].

## 2016-07-07 NOTE — H&P (Signed)
History and Physical    Whitney BYERLY [id number] DOB: Feb 05, 1923 DOA: 07/07/2016  PCP: Mathews Argyle, MD   Patient coming from: [facility].  Chief Complaint: AMS.  HPI: Whitney Marquez is a 82 y.o. female with medical history significant of aortic stenosis, osteoarthritis, history of breast cancer, history of colon cancer, cataracts, hypertension, scoliosis who was discharged from the hospital yesterday due to acute metabolic encephalopathy and now is being brought back to the emergency department via EMS from [facility] for evaluation of AMS.  She was also admitted on 06/24/2016 for 2 days due to confusion secondary to UTI. She was seen in the emergency department on 06/30/2016 due to having a fall that resulted in a laceration on her right orbital area. Then admitted from 07/02/2016 to 07/06/2016 as earlier stated. Apparently, since the patient arrived to the facility yesterday, her mental status started to decline again. No further history is available at this time.  ED Course: Workup shows a hemoglobin level of 11.5 g/dL, WBC were 15.1, yesterday were 15.5. Troponin level was 0.15, sodium 152 mmol/L . BUN 47 and creatinine 0.86 mg/dL. A chest radiograph showed possible interstitial edema.  Review of Systems:  Unable to review.   Past Medical History:  Diagnosis Date  . Aortic valve stenosis   . Arthritis   . Cancer [facility])    breast and colon  . Cancer ([place])   . Cataract   . Cataracts, both eyes   . Hypertension   . Plantar keratosis   . Scoliosis     Past Surgical History:  Procedure Laterality Date  . BACK SURGERY    . BREAST SURGERY     breast cancer  . COLON SURGERY     colon cancer  . COLON SURGERY    . LUMBAR LAMINECTOMY    . meniscus tear    . TOE AMPUTATION     second toe     reports that she has never smoked. She has never used smokeless tobacco. She reports that she does not drink alcohol. Her drug history is not on  file.  Allergies  Allergen Reactions  . Cephalosporins Anaphylaxis  . Ciprofloxacin Other (See Comments)    Son reports seizure-like activity  . Other Swelling    Walnuts causes lips swelling  . Pyridium [Phenazopyridine Hcl] Other (See Comments)    UNKNOWN  . Aleve [Naproxen] Rash  . Iodine Itching and Rash  . Macrobid [Nitrofurantoin Monohyd Macro] Swelling and Rash  . Penicillins Hives and Rash    Has patient had a PCN reaction causing immediate rash, facial/tongue/throat swelling, SOB or lightheadedness with hypotension: Yes Has patient had a PCN reaction causing severe rash involving mucus membranes or skin necrosis: Unknown Has patient had a PCN reaction that required hospitalization: Unknown Has patient had a PCN reaction occurring within the last 10 years: No If all of the above answers are "NO", then may proceed with Cephalosporin use.   . Shellfish Allergy Itching and Rash   Family history Mother-hypertension.  Prior to Admission medications   Medication Sig Start Date End Date Taking? Authorizing Provider  acetaminophen (TYLENOL) 325 MG tablet Take 2 tablets (650 mg total) by mouth every 6 (six) hours as needed for mild pain (or Fever >/= 101). 07/06/16  Yes Geradine Girt, DO  Artificial Saliva (BIOTENE DRY MOUTH MOISTURIZING) SOLN See admin instructions. 1-2 sprays into the mouth three times a day   Yes Historical Provider, MD  aspirin 81 MG tablet  Take 81 mg by mouth daily with breakfast.    Yes Historical Provider, MD  Calcium-Vitamin D-Vitamin K 500-100-40 MG-UNT-MCG CHEW Chew 1 tablet by mouth 3 (three) times daily with meals.    Yes Historical Provider, MD  Hypromellose (ARTIFICIAL TEARS) 0.4 % SOLN Apply 2 drops to eye 3 (three) times daily.   Yes Historical Provider, MD  lidocaine (LIDODERM) 5 % Place 2 patches onto the skin daily. Remove & Discard patch within 12 hours or as directed by MD (knee and back as needed for pain) Patient taking differently: Place 1  patch onto the skin daily. Remove & Discard patch within 12 hours or as directed by MD (apply to affected knee) 07/06/16  Yes Geradine Girt, DO  omeprazole (PRILOSEC) 20 MG capsule Take 20 mg by mouth daily.   Yes Historical Provider, MD  Polyvinyl Alcohol-Povidone (REFRESH OP) Apply 2 drops to eye at bedtime as needed (for dry eyes).    Yes Historical Provider, MD  Maltodextrin-Xanthan Gum (RESOURCE THICKENUP CLEAR) POWD Pudding thick liquids Patient not taking: Reported on 07/07/2016 07/06/16   Geradine Girt, DO    Physical Exam:  Constitutional: Looks acutely ill. Vitals:   07/07/16 1800 07/07/16 1827 07/07/16 1845 07/07/16 1930  BP: 136/83 138/84 144/90 129/85  Pulse: 104 105 103 108  Resp: 25 20 19 18   Temp:      TempSrc:      SpO2: 92% 97% 95% 91%   Eyes: PERRL, lids and conjunctivae normal ENMT: Mucous membranes are dry. Posterior pharynx clear of any exudate or lesions. Neck: normal, supple, no masses, no thyromegaly Respiratory: clear to auscultation bilaterally, no wheezing, no crackles. Normal respiratory effort. No accessory muscle use.  Cardiovascular: Tachycardic at 112 bpm, 3/6 SEM, no rubs / gallops. No extremity edema. 2+ pedal pulses. No carotid bruits.  Abdomen: Bowel sounds positive. Soft, no tenderness, no masses palpated. No hepatosplenomegaly.  Musculoskeletal: no clubbing / cyanosis. no contractures. Normal muscle tone.  Skin: Extensive ecchymosis of right facial, periorbital Ecchymosis of the extremities.  Neurologic:Unable to fully evaluate as the patient is delirious. Psychiatric:, Delirious. Does not follow commands.  Labs on Admission: I have personally reviewed following labs and imaging studies  CBC:  Recent Labs Lab 07/02/16 2010 07/03/16 0400 07/04/16 0507 07/05/16 0442 07/06/16 0628 07/07/16 1502  WBC 11.5* 9.5 16.5* 12.4* 15.5* 15.1*  NEUTROABS 7.9*  --   --   --   --  11.5*  HGB 11.8* 11.1* 12.7 11.2* 11.5* 12.7  HCT 36.2 33.3* 38.5  34.8* 35.9* 40.2  MCV 87.9 86.7 86.3 88.3 89.1 91.6  PLT 268 285 358 337 341 [id number]   Basic Metabolic Panel:  Recent Labs Lab 07/04/16 0507 07/05/16 0442 07/05/16 1607 07/06/16 0628 07/07/16 1502  NA 133* 136 138 144 152*  K 3.3* 2.9* 3.9 3.5 3.6  CL 95* 102 104 104 113*  CO2 23 24 19* 19* 24  GLUCOSE 118* 79 82 100* 109*  BUN 13 33* 34* 31* 47*  CREATININE 0.62 0.65 0.71 0.75 0.86  CALCIUM 9.4 9.0 9.1 9.3 10.3  MG  --  2.0  --   --   --    GFR: Estimated Creatinine Clearance: 35.2 mL/min (by C-G formula based on SCr of 0.86 mg/dL). Liver Function Tests:  Recent Labs Lab 07/02/16 2010 07/07/16 1502  AST 23 46*  ALT 17 38  ALKPHOS 42 50  BILITOT 0.6 1.2  PROT 6.2* 6.7  ALBUMIN 3.5 3.6    Recent  Labs Lab 07/02/16 2010  LIPASE 40   No results for input(s): AMMONIA in the last 168 hours. Coagulation Profile:  Recent Labs Lab 07/02/16 2010  INR 1.01   Cardiac Enzymes:  Recent Labs Lab 07/07/16 1540  TROPONINI 0.15*   BNP (last 3 results) No results for input(s): PROBNP in the last 8760 hours. HbA1C: No results for input(s): HGBA1C in the last 72 hours. CBG:  Recent Labs Lab 07/07/16 1437  GLUCAP 106*   Lipid Profile: No results for input(s): CHOL, HDL, LDLCALC, TRIG, CHOLHDL, LDLDIRECT in the last 72 hours. Thyroid Function Tests: No results for input(s): TSH, T4TOTAL, FREET4, T3FREE, THYROIDAB in the last 72 hours. Anemia Panel: No results for input(s): VITAMINB12, FOLATE, FERRITIN, TIBC, IRON, RETICCTPCT in the last 72 hours. Urine analysis:    Component Value Date/Time   COLORURINE YELLOW 07/02/2016 2142   APPEARANCEUR CLOUDY (A) 07/02/2016 2142   LABSPEC 1.015 07/02/2016 2142   PHURINE 7.5 07/02/2016 2142   GLUCOSEU NEGATIVE 07/02/2016 2142   HGBUR TRACE (A) 07/02/2016 2142   BILIRUBINUR NEGATIVE 07/02/2016 2142   KETONESUR NEGATIVE 07/02/2016 2142   PROTEINUR NEGATIVE 07/02/2016 2142   UROBILINOGEN 0.2 10/15/2014 0125   NITRITE  NEGATIVE 07/02/2016 2142   LEUKOCYTESUR MODERATE (A) 07/02/2016 2142   Recent Results (from the past 240 hour(s))  Urine culture     Status: Abnormal   Collection Time: 06/30/16  4:15 PM  Result Value Ref Range Status   Specimen Description URINE, RANDOM  Final   Special Requests NONE  Final   Culture (A)  Final    <10,000 COLONIES/mL INSIGNIFICANT GROWTH Performed at [facility]    Report Status 07/02/2016 FINAL  Final  Urine culture     Status: Abnormal   Collection Time: 07/02/16  9:42 PM  Result Value Ref Range Status   Specimen Description Urine  Final   Special Requests NONE  Final   Culture <10,000 COLONIES/mL INSIGNIFICANT GROWTH (A)  Final   Report Status 07/04/2016 FINAL  Final  MRSA PCR Screening     Status: None   Collection Time: 07/04/16  5:23 AM  Result Value Ref Range Status   MRSA by PCR NEGATIVE NEGATIVE Final    Comment:        The GeneXpert MRSA Assay (FDA approved for NASAL specimens only), is one component of a comprehensive MRSA colonization surveillance program. It is not intended to diagnose MRSA infection nor to guide or monitor treatment for MRSA infections.      Radiological Exams on Admission: Ct Head Wo Contrast  Result Date: 07/07/2016 CLINICAL DATA:  Altered mental status. Patient unresponsive. Hypoxia. Recent fall. EXAM: CT HEAD WITHOUT CONTRAST TECHNIQUE: Contiguous axial images were obtained from the base of the skull through the vertex without intravenous contrast. COMPARISON:  CT head 07/02/2016.  Initial CT head 06/30/2016. FINDINGS: Brain: Global atrophy. Small vessel disease. No hydrocephalus or intra-axial mass lesion. No features are seen to confirm cerebral infarction or anoxic injury. Increasing BILATERAL extra-axial CSF like fluid collections consistent with hygromas. 13 mm thick on the RIGHT and 12 mm thick on the LEFT, significant progression from most recent study 5 days ago. Sulcal flattening is present, without  herniation. Vascular: No hyperdense vessel or unexpected calcification. Skull: Normal. Negative for fracture or focal lesion. RIGHT frontal scalp hematoma redemonstrated. Sinuses/Orbits: No acute finding. Other: None. IMPRESSION: Significant increase in BILATERAL subdural hygromas since 06/30/16 and 07/02/2016. LEFT untreated, increased intracranial pressure or herniation could occur. Depending on the patient's  clinical condition, neurosurgical consultation may be warranted. These results were called by telephone at the time of interpretation on 07/07/2016 at 5:29 pm to Dr. Jola Schmidt , who verbally acknowledged these results. Electronically Signed   By: Staci Righter M.D.   On: 07/07/2016 17:31   Dg Chest Portable 1 View  Result Date: 07/07/2016 CLINICAL DATA:  Shortness of breath. EXAM: PORTABLE CHEST 1 VIEW COMPARISON:  07/02/2016 FINDINGS: Chronic elevation of the right hemidiaphragm. Heart and mediastinum are stable. Aortic arch has atherosclerotic calcifications. Surgical clips in the right axilla. Subtle interstitial densities throughout both lungs could represent mild edema versus chronic change. Negative for a pneumothorax. IMPRESSION: Question mild interstitial edema. Stable elevation of the right hemidiaphragm. Aortic atherosclerosis. Electronically Signed   By: Markus Daft M.D.   On: 07/07/2016 15:57    EKG: Independently reviewed. Vent. rate 107 BPM PR interval * ms QRS duration 86 ms QT/QTc 324/433 ms P-R-T axes -26 -21 -1 Sinus tachycardia Left ventricular hypertrophy Anterior Q waves, possibly due to LVH abnormal ST segment isolated to lead V2 changed from prior ecg  Assessment/Plan Principal Problem:   Altered mental status Likely due to dehydration, since her mental status improved after IV fluids. Continue IV fluids and comfort care measures. No obvious source of infection and afebrile after fluids. Correct hypernatremia.  Palliative care evaluation in AM.  Active  Problems:   Hypernatraemia Continue gentle IV hydration with hypotonic fluids. Follow-up sodium level in the morning.    Subdural hygroma The case was discussed by Dr. Venora Maples with neurosurgery (Dr. Ronnald Ramp), who agreed that due to her clinical condition she is not a good surgical candidate. Reconsult neurosurgery if needed.    Anemia Monitor H&H.    Essential hypertension Hold antihypertensive therapy for now. Continue IV hydration. Monitor blood pressure.    Aortic stenosis Briefly explained to her daughter this condition. Not a candidate for surgical intervention given his age and overall clinical state.    Leukocytosis Her WBCs are similar to what they were yesterday. No obvious source of infections and a febrile after given fluids. Still awaiting for urine analysis. We'll continue to observe. Follow WBC in the morning.    Elevated troponin Trend troponin level.        DVT prophylaxis: SCDs. Code Status: DO NOT RESUSCITATE. Family Communication: Her daughter was present at the bedside. Disposition Plan: Admit for IV hydration and comfort measures. Consults called:  Admission status: Observation/telemetry.   Reubin Milan MD [facility] Pager [phone].  If 7PM-7AM, please contact night-coverage [email or web address] Password [email or web address]  07/07/2016, 8:55 PM

## 2016-07-07 NOTE — Progress Notes (Addendum)
Patient ID: Whitney Marquez, female   DOB: 07/14/1923, 82 y.o.   MRN: [id number]  Location:  [place]:  SNF (31) Provider:   Cindi Carbon, [place] [phone]  Mathews Argyle, MD  Patient Care Team: Lajean Manes, MD as PCP - General (Internal Medicine) Lajean Manes, MD (Internal Medicine)  Extended Emergency Contact Information Primary Emergency Contact: [place] Mobile Phone: [phone] Relation: Daughter Secondary Emergency Contact: Micek,Ethel  [place] Phone: [phone] Relation: Daughter  Code Status:  DNR Goals of care: Advanced Directive information Advanced Directives 07/07/2016  Does patient have an advance directive? Yes  Type of Advance Directive [place]  Does patient want to make changes to advanced directive? -  Copy of advanced directive(s) in chart? Yes     Chief Complaint  Patient presents with  . Hospitalization Follow-up    HPI:  Pt is a 82 y.o. female seen today for a hospital f/u s/p admission from 07/02/16-07/06/16 due to confusion/right facial droop. She was diagnosed with acute metabolic encephalopathy of unclear etiology. There was a question of this was due to Cipro, which was discontinued but I am not sure of when her last dose was administered. She was admitted to the hospital 9/23-9/25 after presenting with confusion at home (she lived at [place] independently).  An extensive stroke work up was performed  And neuro was consulted. She was not found to have a CVA.  She was diagnosed with a UTI and sent home with Bactrim.  She was in rehab at [facility] on 9/29 where she fell and had a contusion of the right side of her face and 2 lacerations 1 of the lateral aspect of the eye brow and 1 to the right forehead.  No acute abnormalities were noted of the CT of the head or neck at that time. She was again  diagnosed with a UTI and placed on Cipro. Of note she has had three urine cultures, none of which showed any significant growth.   During the most recent hospital stay in October she experienced intermittent delirium and tried seroquel which made her "too sleepy" per her son.  Her WBC was elevated during her stay, on d/c it was 15.5 with no clear etiology. No blood cultures have been drawn.   MRI on 10/2 showed no diffusion signal abnormality or acute infract. EEG showed diffuse slowing.  Speech saw her on 10/5 and stated that she had improved alertness. She was found to be an aspiration risk and D1 diet with pudding thick liquid were recommended. I do not believe she was able to follow commands for a Modified barium swallow. Her mental status waxes and wanes and since her admission last evening she has not been able to swallow any pills and only took small sips of fluid. Her BP has been variable during her stay, from 99991111 systolic. HR from 112-140.  This was also the case during her hospital stay which was felt to be associated with agitation.  She has had some right hip and knee pain from her fall, xrays were negative. She is only mumbling at this time but does not appear to be in pain.   Her son is at the bedside and she is a DNR.  They are not ready to "give up" or call in hospice.  Positive d dimer in the hospital, leg dopplers neg. No chest CT obtained due to shellfish  allergy.  Past Medical History:  Diagnosis Date  . Aortic valve stenosis   . Arthritis   . Cancer [facility])    breast and colon  . Cancer ([place])   . Cataract   . Cataracts, both eyes   . Hypertension   . Plantar keratosis   . Scoliosis    Past Surgical History:  Procedure Laterality Date  . BACK SURGERY    . BREAST SURGERY     breast cancer  . COLON SURGERY     colon cancer  . COLON SURGERY    . LUMBAR LAMINECTOMY    . meniscus tear    . TOE AMPUTATION     second toe    Allergies  Allergen Reactions  .  Cephalosporins Anaphylaxis  . Iodine     unknown  . Other Swelling    Walnuts causes lips swelling  . Pyridium [Phenazopyridine Hcl]     unknown  . Aleve [Naproxen] Rash  . Macrobid [Nitrofurantoin Monohyd Macro] Swelling and Rash  . Penicillins Hives and Rash    Has patient had a PCN reaction causing immediate rash, facial/tongue/throat swelling, SOB or lightheadedness with hypotension: Unknown Has patient had a PCN reaction causing severe rash involving mucus membranes or skin necrosis: Unknown Has patient had a PCN reaction that required hospitalization: No  Has patient had a PCN reaction occurring within the last 10 years: Unknown If all of the above answers are "NO", then may proceed with Cephalosporin use.   . Shellfish Allergy Rash      Medication List       Accurate as of 07/07/16  9:52 AM. Always use your most recent med list.          acetaminophen 325 MG tablet Commonly known as:  TYLENOL Take 2 tablets (650 mg total) by mouth every 6 (six) hours as needed for mild pain (or Fever >/= 101).   aspirin 81 MG tablet Take 81 mg by mouth daily with breakfast.   Calcium-Vitamin D-Vitamin K 500-100-40 MG-UNT-MCG Chew Chew 1 tablet by mouth 3 (three) times daily with meals.   lidocaine 5 % Commonly known as:  LIDODERM Place 2 patches onto the skin daily. Remove & Discard patch within 12 hours or as directed by MD (knee and back as needed for pain)   omeprazole 20 MG capsule Commonly known as:  PRILOSEC Take 20 mg by mouth daily.   REFRESH OP Apply 1-2 drops to eye at bedtime as needed (for dry eyes).   RESOURCE THICKENUP CLEAR Powd Pudding thick liquids       Review of Systems  Unable to perform ROS: Dementia    Immunization History  Administered Date(s) Administered  . PPD Test 10/20/2014   Pertinent  Health Maintenance Due  Topic Date Due  . DEXA SCAN  10/31/1989  . PNA vac Low Risk Adult (1 of 2 - PCV13) 10/31/1989  . INFLUENZA VACCINE   05/02/2016   No flowsheet data found. Functional Status Survey:    Vitals:   07/07/16 0935  BP: (!) 160/95  Pulse: (!) 112  Resp: (!) 24  Temp: 97.5 F (36.4 C)  SpO2: 90%   There is no height or weight on file to calculate BMI. Physical Exam  Constitutional: She appears distressed (mildy agitated and increased RR).  HENT:  Right Ear: External ear normal.  Left Ear: External ear normal.  Bruising to the right forehead and orbital area, dependent bruising down the neck on the right. Very dry mouth  and small amt of yellow drainage in the OP area.  Eyes: Conjunctivae are normal. Pupils are equal, round, and reactive to light. Right eye exhibits no discharge. Left eye exhibits no discharge.  Neck: No JVD present. No tracheal deviation present. No thyromegaly present.  Cardiovascular: Regular rhythm.   No murmur heard. Rate 112, no edema  Pulmonary/Chest: Effort normal. No respiratory distress. She has no wheezes. She has rales (bases).  Abdominal: Soft. Bowel sounds are normal. She exhibits no distension. There is no tenderness.  Musculoskeletal: She exhibits no edema or tenderness.  Neurological: She is alert. She displays normal reflexes. No cranial nerve deficit.  Not able to follow commands, mumbles indecipherable words.  Eyes are open looks up and to the right  Skin: Skin is warm and dry. She is not diaphoretic.    Labs reviewed:  Recent Labs  07/05/16 0442 07/05/16 1607 07/06/16 0628  NA 136 138 144  K 2.9* 3.9 3.5  CL 102 104 104  CO2 24 19* 19*  GLUCOSE 79 82 100*  BUN 33* 34* 31*  CREATININE 0.65 0.71 0.75  CALCIUM 9.0 9.1 9.3  MG 2.0  --   --     Recent Labs  06/24/16 1408 07/02/16 2010  AST 25 23  ALT 14 17  ALKPHOS 38 42  BILITOT 0.4 0.6  PROT 6.0* 6.2*  ALBUMIN 3.5 3.5    Recent Labs  06/24/16 1408  07/02/16 2010  07/04/16 0507 07/05/16 0442 07/06/16 0628  WBC 14.0*  --  11.5*  < > 16.5* 12.4* 15.5*  NEUTROABS 10.2*  --  7.9*  --   --    --   --   HGB 11.8*  < > 11.8*  < > 12.7 11.2* 11.5*  HCT 36.8  < > 36.2  < > 38.5 34.8* 35.9*  MCV 89.5  --  87.9  < > 86.3 88.3 89.1  PLT 316  --  268  < > 358 337 341  < > = values in this interval not displayed. Lab Results  Component Value Date   TSH 4.127 07/02/2016   Lab Results  Component Value Date   HGBA1C 5.5 06/25/2016   Lab Results  Component Value Date   CHOL 124 06/25/2016   HDL 76 06/25/2016   LDLCALC 33 06/25/2016   TRIG 75 06/25/2016   CHOLHDL 1.6 06/25/2016    Significant Diagnostic Results in last 30 days:  Dg Chest 2 View  Result Date: 07/02/2016 CLINICAL DATA:  Fall 2 days ago with chest pain, initial encounter EXAM: CHEST  2 VIEW COMPARISON:  05/17/2016 FINDINGS: Cardiac shadow is stable. Aortic calcifications are again seen. Elevation the right hemidiaphragm is noted. Some mild interstitial changes are again seen and stable. No pneumothorax or focal infiltrate is seen. Bony structures show no acute abnormality. IMPRESSION: No active cardiopulmonary disease. Electronically Signed   By: Inez Catalina M.D.   On: 07/02/2016 19:57   Ct Head Wo Contrast  Result Date: 07/02/2016 CLINICAL DATA:  Right-sided bruising after falling 2 days ago. Slurred speech. Generalized weakness. EXAM: CT HEAD WITHOUT CONTRAST TECHNIQUE: Contiguous axial images were obtained from the base of the skull through the vertex without intravenous contrast. COMPARISON:  Head CT 06/30/2016 and 06/24/2016. MRI brain 06/25/2016. FINDINGS: Despite efforts by the technologist and patient, motion artifact is present on today's exam and could not be eliminated. This reduces exam sensitivity and specificity. Brain: There is no evidence of acute intracranial hemorrhage, mass lesion, brain edema or  new extra-axial fluid collection. There is stable diffuse prominence of the subarachnoid spaces, especially over the frontal lobes. No hydrocephalus. There is no CT evidence of acute cortical infarction. Stable  chronic small vessel ischemic changes in the periventricular white matter. Vascular: Extensive intracranial vascular calcifications are again noted. Skull: Negative for fracture or focal lesion. Sinuses/Orbits: Periorbital swelling on the right appears improved. The visualized paranasal sinuses, mastoid air cells and middle ears are clear. Other: None. IMPRESSION: 1. Interval improvement in periorbital swelling/hematoma on the right. 2. No acute intracranial findings. 3. Stable atrophy and chronic small vessel ischemic changes. Electronically Signed   By: Richardean Sale M.D.   On: 07/02/2016 20:29   Mr Brain Ltd W/o Cm  Result Date: 07/03/2016 CLINICAL DATA:  82 y/o F; slurred speech, a aphasia, and right leg weakness. EXAM: MRI HEAD WITHOUT CONTRAST TECHNIQUE: Sagittal T1, axial DWI, and coronal DWI sequences were acquired. The patient was unable to continue the exam. COMPARISON:  07/02/2016 CT head.  06/25/2016 MR head. FINDINGS: Motion degraded sagittal T1 and axial DWI sequences. No diffusion signal abnormality is identified. Hyperostosis frontalis interna. Moderate parenchymal volume loss. T1 hypo intense foci in subcortical and periventricular white matter as well as within the if corpus callosum are consistent with chronic microvascular ischemic changes and small lacunar infarcts. Mucous thickening within the sphenoid sinus. IMPRESSION: No diffusion signal abnormality to suggest acute/early subacute infarct. Electronically Signed   By: Kristine Garbe M.D.   On: 07/03/2016 00:22    Assessment/Plan 1. Acute metabolic encephalopathy No improvement over night with unclear etiology No longer on cipro, ?encephalitis with elevated WBC?? Has definite neurologic findings as she looks to up and to the right and/or left, does not close her eyes, increase resp rate and labile HR and BP readings Her family prefers her to remain in the facility but if she worsens they are not ready to call in hospice  and would prefer her to be sent back to the ER. BP running high so no signs of shock Check blood cultures x 2 Monitor neuro status and VS Avoid sedatives if possible to be able to assess her alertness  2. Leukocytosis, unspecified type Check CBC today, blood cultures F/U CXR today  3. Dehydration IV D5 1/2NS at 75 cc/hr since she is not eating, await BMP  4. Oropharyngeal dysphagia NPO until more alert Oral care TID ST  5. Essential hypertension Improved, also associated with agitation Not able to swallow oral meds at this time No aggressive management due to fall risk  6. Tachycardia Intermittent, regular, associated with agitation but never fully resolved  7. Positive D-dimer No work up due to shell fish allergy, no evidence of DVT Fall risk  8. Facial laceration, initial encounter May remove sutures  9. Advanced care planning  Ms. Scheuerman has significant encephalopathy of unclear etiology. She has a hematoma to her head but no additional findings on CT/MRI.  Remains with leukocytosis so this could be a contributing factor.  I have discussed her situation with her son and he will discuss with his other family members. We agreed that if she does not improve or worsened she will need to go back to the hospital, as they are not ready to proceed with comfort care. She would need frequent neurologic monitoring and specialist follow up if this situation does not improve. She is a DNR but they would like to continue with IVF and even short term tube feeding if necessary.  We agreed that if  she is able to stay in the facility but does improve over the weekend then they will consider hospice.    Family/ staff Communication: discussed with her son  Labs/tests ordered:  CBC BMP CXR Blood cultures

## 2016-07-07 NOTE — ED Notes (Signed)
[facility] called to discontinue the code sepsis

## 2016-07-07 NOTE — ED Notes (Signed)
Patient stable for transport to floor at this time.  RN will take patient to [facility]

## 2016-07-07 NOTE — ED Notes (Signed)
CBG 106 

## 2016-07-08 DIAGNOSIS — D649 Anemia, unspecified: Secondary | ICD-10-CM | POA: Diagnosis present

## 2016-07-08 DIAGNOSIS — R339 Retention of urine, unspecified: Secondary | ICD-10-CM | POA: Diagnosis present

## 2016-07-08 DIAGNOSIS — R1310 Dysphagia, unspecified: Secondary | ICD-10-CM | POA: Diagnosis present

## 2016-07-08 DIAGNOSIS — E785 Hyperlipidemia, unspecified: Secondary | ICD-10-CM | POA: Diagnosis present

## 2016-07-08 DIAGNOSIS — I10 Essential (primary) hypertension: Secondary | ICD-10-CM | POA: Diagnosis present

## 2016-07-08 DIAGNOSIS — Z853 Personal history of malignant neoplasm of breast: Secondary | ICD-10-CM | POA: Diagnosis not present

## 2016-07-08 DIAGNOSIS — Z888 Allergy status to other drugs, medicaments and biological substances status: Secondary | ICD-10-CM | POA: Diagnosis not present

## 2016-07-08 DIAGNOSIS — M419 Scoliosis, unspecified: Secondary | ICD-10-CM | POA: Diagnosis present

## 2016-07-08 DIAGNOSIS — R4182 Altered mental status, unspecified: Secondary | ICD-10-CM | POA: Diagnosis not present

## 2016-07-08 DIAGNOSIS — G9389 Other specified disorders of brain: Secondary | ICD-10-CM | POA: Diagnosis present

## 2016-07-08 DIAGNOSIS — Z8249 Family history of ischemic heart disease and other diseases of the circulatory system: Secondary | ICD-10-CM | POA: Diagnosis not present

## 2016-07-08 DIAGNOSIS — F0390 Unspecified dementia without behavioral disturbance: Secondary | ICD-10-CM | POA: Diagnosis present

## 2016-07-08 DIAGNOSIS — Z91013 Allergy to seafood: Secondary | ICD-10-CM | POA: Diagnosis not present

## 2016-07-08 DIAGNOSIS — Z79899 Other long term (current) drug therapy: Secondary | ICD-10-CM | POA: Diagnosis not present

## 2016-07-08 DIAGNOSIS — Z880 Allergy status to penicillin: Secondary | ICD-10-CM | POA: Diagnosis not present

## 2016-07-08 DIAGNOSIS — I350 Nonrheumatic aortic (valve) stenosis: Secondary | ICD-10-CM | POA: Diagnosis not present

## 2016-07-08 DIAGNOSIS — D72829 Elevated white blood cell count, unspecified: Secondary | ICD-10-CM | POA: Diagnosis present

## 2016-07-08 DIAGNOSIS — Z7982 Long term (current) use of aspirin: Secondary | ICD-10-CM | POA: Diagnosis not present

## 2016-07-08 DIAGNOSIS — Z66 Do not resuscitate: Secondary | ICD-10-CM | POA: Diagnosis present

## 2016-07-08 DIAGNOSIS — Z7189 Other specified counseling: Secondary | ICD-10-CM | POA: Diagnosis not present

## 2016-07-08 DIAGNOSIS — Z515 Encounter for palliative care: Secondary | ICD-10-CM | POA: Diagnosis not present

## 2016-07-08 DIAGNOSIS — D181 Lymphangioma, any site: Secondary | ICD-10-CM | POA: Diagnosis not present

## 2016-07-08 DIAGNOSIS — E860 Dehydration: Secondary | ICD-10-CM | POA: Diagnosis present

## 2016-07-08 DIAGNOSIS — Z886 Allergy status to analgesic agent status: Secondary | ICD-10-CM | POA: Diagnosis not present

## 2016-07-08 DIAGNOSIS — Z85038 Personal history of other malignant neoplasm of large intestine: Secondary | ICD-10-CM | POA: Diagnosis not present

## 2016-07-08 DIAGNOSIS — G9341 Metabolic encephalopathy: Secondary | ICD-10-CM | POA: Diagnosis present

## 2016-07-08 DIAGNOSIS — E870 Hyperosmolality and hypernatremia: Secondary | ICD-10-CM | POA: Diagnosis not present

## 2016-07-08 DIAGNOSIS — Z881 Allergy status to other antibiotic agents status: Secondary | ICD-10-CM | POA: Diagnosis not present

## 2016-07-08 LAB — URINALYSIS, ROUTINE W REFLEX MICROSCOPIC
GLUCOSE, UA: NEGATIVE mg/dL
HGB URINE DIPSTICK: NEGATIVE
KETONES UR: 15 mg/dL — AB
Leukocytes, UA: NEGATIVE
Nitrite: NEGATIVE
PH: 6 (ref 5.0–8.0)
PROTEIN: 30 mg/dL — AB
Specific Gravity, Urine: 1.025 (ref 1.005–1.030)

## 2016-07-08 LAB — CBC
HEMATOCRIT: 35.3 % — AB (ref 36.0–46.0)
Hemoglobin: 10.8 g/dL — ABNORMAL LOW (ref 12.0–15.0)
MCH: 28.2 pg (ref 26.0–34.0)
MCHC: 30.6 g/dL (ref 30.0–36.0)
MCV: 92.2 fL (ref 78.0–100.0)
PLATELETS: 233 10*3/uL (ref 150–400)
RBC: 3.83 MIL/uL — ABNORMAL LOW (ref 3.87–5.11)
RDW: 15 % (ref 11.5–15.5)
WBC: 12.3 10*3/uL — ABNORMAL HIGH (ref 4.0–10.5)

## 2016-07-08 LAB — COMPREHENSIVE METABOLIC PANEL
ALBUMIN: 2.9 g/dL — AB (ref 3.5–5.0)
ALT: 31 U/L (ref 14–54)
AST: 34 U/L (ref 15–41)
Alkaline Phosphatase: 40 U/L (ref 38–126)
Anion gap: 8 (ref 5–15)
BUN: 40 mg/dL — AB (ref 6–20)
CHLORIDE: 118 mmol/L — AB (ref 101–111)
CO2: 27 mmol/L (ref 22–32)
CREATININE: 0.53 mg/dL (ref 0.44–1.00)
Calcium: 9.3 mg/dL (ref 8.9–10.3)
GFR calc Af Amer: >60 mL/min (ref >60)
GLUCOSE: 147 mg/dL — AB (ref 65–99)
POTASSIUM: 3.5 mmol/L (ref 3.5–5.1)
SODIUM: 153 mmol/L — AB (ref 135–145)
Total Bilirubin: 1 mg/dL (ref 0.3–1.2)
Total Protein: 5.6 g/dL — ABNORMAL LOW (ref 6.5–8.1)

## 2016-07-08 LAB — URINE MICROSCOPIC-ADD ON

## 2016-07-08 LAB — TROPONIN I
TROPONIN I: 0.1 ng/mL — AB (ref <0.03)
Troponin I: 0.11 ng/mL (ref <0.03)

## 2016-07-08 MED ORDER — CHLORHEXIDINE GLUCONATE 0.12 % MT SOLN
15.0000 mL | Freq: Two times a day (BID) | OROMUCOSAL | Status: DC
  Administered 2016-07-08 – 2016-07-11 (×6): 15 mL via OROMUCOSAL
  Filled 2016-07-08 (×7): qty 15

## 2016-07-08 MED ORDER — ORAL CARE MOUTH RINSE
15.0000 mL | Freq: Two times a day (BID) | OROMUCOSAL | Status: DC
  Administered 2016-07-08 – 2016-07-11 (×5): 15 mL via OROMUCOSAL

## 2016-07-08 NOTE — Progress Notes (Addendum)
PROGRESS NOTE [facility]   Whitney Marquez   [id number] DOB: December 19, 1922  DOA: 07/07/2016 PCP: Mathews Argyle, MD   Brief Narrative:   Whitney Marquez is a 82 y.o. female with medical history significant of aortic stenosis, osteoarthritis, history of breast cancer, history of colon cancer, cataracts, hypertension, scoliosis who was discharged from the hospital yesterday due to acute metabolic encephalopathy and now is being brought back to the emergency department via EMS from [facility] for evaluation of AMS.  Subjective:  Patient seen and examined with daughter at bedside. Improving since yesterday, but still remains slight confused. No acute events overnight.  Assessment & Plan:   Altered mental status -  Continue IV fluids and comfort care measures. No obvious source of infection and afebrile Correct hypernatremia.  Palliative care evaluation pending   Hypernatraemia - no improvement Continue gentle IV hydration with D5W  Follow-up sodium level in the morning.  Subdural hygroma Not a good surgical candidate.  Anemia Monitor H&H.  Essential hypertension Hold antihypertensive therapy for now. Continue IV hydration. Monitor blood pressure.  Leukocytosis No obvious source of infections and afebrile - trending down Still awaiting for urine analysis. We'll continue to observe. Follow WBC in the morning.  Urinary retention - unclear etiology, meds reviewed Bladder scan every 8 hours In and out cath if bladder scan show more than 400 mL Avoid anticholinergic medications  DVT prophylaxis: SCDs. Code Status: DO NOT RESUSCITATE. Family Communication: Her daughter was present at the bedside. Disposition Plan: Palliative care discussion pending.    Consultants:   Palliative care  Antimicrobials:   Objective: Vitals:   07/07/16 2030 07/07/16 2121 07/08/16 0637 07/08/16 1333  BP: 126/81 (!) 161/88 (!) 159/81 131/71  Pulse: 104 (!) 107  90 88  Resp: 19 19 20 18   Temp:  98.5 F (36.9 C) 97.8 F (36.6 C) 97.6 F (36.4 C)  TempSrc:  Oral Oral Oral  SpO2: 90% 96% 95% 98%    Intake/Output Summary (Last 24 hours) at 07/08/16 1628 Last data filed at 07/08/16 1559  Gross per 24 hour  Intake          3766.67 ml  Output              600 ml  Net          3166.67 ml   There were no vitals filed for this visit.  Examination:  General exam: Appears calm and comfortable  Respiratory system: Clear to auscultation. No wheezes,crackle or rhonchi Cardiovascular system: S1 & S2 heard, RRR Systolic murmur 3/6. No JVD, rubs or gallops Central nervous system: Confused. No focal neurological deficits. Follow commands  Extremities: No pedal edema. Symmetric, strength 5/5   Skin: No rashes or ulcers, ecchymosis extending from her R frontal area down to her neck.  Psychiatry: Judgement and insight appear normal. Mood & affect appropriate.    Data Reviewed: I have personally reviewed following labs and imaging studies  CBC:  Recent Labs Lab 07/02/16 2010  07/04/16 0507 07/05/16 0442 07/06/16 0628 07/07/16 1502 07/08/16 0446  WBC 11.5*  < > 16.5* 12.4* 15.5* 15.1* 12.3*  NEUTROABS 7.9*  --   --   --   --  11.5*  --   HGB 11.8*  < > 12.7 11.2* 11.5* 12.7 10.8*  HCT 36.2  < > 38.5 34.8* 35.9* 40.2 35.3*  MCV 87.9  < > 86.3 88.3 89.1 91.6 92.2  PLT 268  < > 358 337 341 314 233  < > =  values in this interval not displayed. Basic Metabolic Panel:  Recent Labs Lab 07/05/16 0442 07/05/16 1607 07/06/16 0628 07/07/16 1502 07/08/16 0446  NA 136 138 144 152* 153*  K 2.9* 3.9 3.5 3.6 3.5  CL 102 104 104 113* 118*  CO2 24 19* 19* 24 27  GLUCOSE 79 82 100* 109* 147*  BUN 33* 34* 31* 47* 40*  CREATININE 0.65 0.71 0.75 0.86 0.53  CALCIUM 9.0 9.1 9.3 10.3 9.3  MG 2.0  --   --   --   --    GFR: Estimated Creatinine Clearance: 37.9 mL/min (by C-G formula based on SCr of 0.53 mg/dL). Liver Function Tests:  Recent Labs Lab  07/02/16 2010 07/07/16 1502 07/08/16 0446  AST 23 46* 34  ALT 17 38 31  ALKPHOS 42 50 40  BILITOT 0.6 1.2 1.0  PROT 6.2* 6.7 5.6*  ALBUMIN 3.5 3.6 2.9*    Recent Labs Lab 07/02/16 2010  LIPASE 40   No results for input(s): AMMONIA in the last 168 hours. Coagulation Profile:  Recent Labs Lab 07/02/16 2010  INR 1.01   Cardiac Enzymes:  Recent Labs Lab 07/07/16 1540 07/07/16 2328 07/08/16 0446  TROPONINI 0.15* 0.11* 0.10*   BNP (last 3 results) No results for input(s): PROBNP in the last 8760 hours. HbA1C: No results for input(s): HGBA1C in the last 72 hours. CBG:  Recent Labs Lab 07/07/16 1437  GLUCAP 106*   Lipid Profile: No results for input(s): CHOL, HDL, LDLCALC, TRIG, CHOLHDL, LDLDIRECT in the last 72 hours. Thyroid Function Tests: No results for input(s): TSH, T4TOTAL, FREET4, T3FREE, THYROIDAB in the last 72 hours. Anemia Panel: No results for input(s): VITAMINB12, FOLATE, FERRITIN, TIBC, IRON, RETICCTPCT in the last 72 hours. Sepsis Labs:  Recent Labs Lab 07/02/16 2028 07/07/16 1517  LATICACIDVEN 0.91 1.38    Recent Results (from the past 240 hour(s))  Urine culture     Status: Abnormal   Collection Time: 06/30/16  4:15 PM  Result Value Ref Range Status   Specimen Description URINE, RANDOM  Final   Special Requests NONE  Final   Culture (A)  Final    <10,000 COLONIES/mL INSIGNIFICANT GROWTH Performed at [facility]    Report Status 07/02/2016 FINAL  Final  Urine culture     Status: Abnormal   Collection Time: 07/02/16  9:42 PM  Result Value Ref Range Status   Specimen Description Urine  Final   Special Requests NONE  Final   Culture <10,000 COLONIES/mL INSIGNIFICANT GROWTH (A)  Final   Report Status 07/04/2016 FINAL  Final  MRSA PCR Screening     Status: None   Collection Time: 07/04/16  5:23 AM  Result Value Ref Range Status   MRSA by PCR NEGATIVE NEGATIVE Final    Comment:        The GeneXpert MRSA Assay (FDA approved  for NASAL specimens only), is one component of a comprehensive MRSA colonization surveillance program. It is not intended to diagnose MRSA infection nor to guide or monitor treatment for MRSA infections.   Blood Culture (routine x 2)     Status: None (Preliminary result)   Collection Time: 07/07/16  2:45 PM  Result Value Ref Range Status   Specimen Description BLOOD RIGHT FOREARM  Final   Special Requests BOTTLES DRAWN AEROBIC AND ANAEROBIC 10CC  Final   Culture NO GROWTH < 24 HOURS  Final   Report Status PENDING  Incomplete  Blood Culture (routine x 2)  Status: None (Preliminary result)   Collection Time: 07/07/16  2:50 PM  Result Value Ref Range Status   Specimen Description BLOOD RIGHT HAND  Final   Special Requests BOTTLES DRAWN AEROBIC AND ANAEROBIC 5CC  Final   Culture NO GROWTH < 24 HOURS  Final   Report Status PENDING  Incomplete      Radiology Studies: Ct Head Wo Contrast  Result Date: 07/07/2016 CLINICAL DATA:  Altered mental status. Patient unresponsive. Hypoxia. Recent fall. EXAM: CT HEAD WITHOUT CONTRAST TECHNIQUE: Contiguous axial images were obtained from the base of the skull through the vertex without intravenous contrast. COMPARISON:  CT head 07/02/2016.  Initial CT head 06/30/2016. FINDINGS: Brain: Global atrophy. Small vessel disease. No hydrocephalus or intra-axial mass lesion. No features are seen to confirm cerebral infarction or anoxic injury. Increasing BILATERAL extra-axial CSF like fluid collections consistent with hygromas. 13 mm thick on the RIGHT and 12 mm thick on the LEFT, significant progression from most recent study 5 days ago. Sulcal flattening is present, without herniation. Vascular: No hyperdense vessel or unexpected calcification. Skull: Normal. Negative for fracture or focal lesion. RIGHT frontal scalp hematoma redemonstrated. Sinuses/Orbits: No acute finding. Other: None. IMPRESSION: Significant increase in BILATERAL subdural hygromas since  06/30/16 and 07/02/2016. LEFT untreated, increased intracranial pressure or herniation could occur. Depending on the patient's clinical condition, neurosurgical consultation may be warranted. These results were called by telephone at the time of interpretation on 07/07/2016 at 5:29 pm to Dr. Jola Schmidt , who verbally acknowledged these results. Electronically Signed   By: Staci Righter M.D.   On: 07/07/2016 17:31   Dg Chest Portable 1 View  Result Date: 07/07/2016 CLINICAL DATA:  Shortness of breath. EXAM: PORTABLE CHEST 1 VIEW COMPARISON:  07/02/2016 FINDINGS: Chronic elevation of the right hemidiaphragm. Heart and mediastinum are stable. Aortic arch has atherosclerotic calcifications. Surgical clips in the right axilla. Subtle interstitial densities throughout both lungs could represent mild edema versus chronic change. Negative for a pneumothorax. IMPRESSION: Question mild interstitial edema. Stable elevation of the right hemidiaphragm. Aortic atherosclerosis. Electronically Signed   By: Markus Daft M.D.   On: 07/07/2016 15:57      Scheduled Meds: . aspirin EC  81 mg Oral Q breakfast  . calcium-vitamin D  1 tablet Oral TID WC  . chlorhexidine  15 mL Mouth Rinse BID  . mouth rinse  15 mL Mouth Rinse q12n4p  . pantoprazole  40 mg Oral Daily  . polyvinyl alcohol  2 drop Both Eyes TID  . sodium chloride flush  3 mL Intravenous Q12H   Continuous Infusions: . dextrose 5 % and 0.45 % NaCl with KCl 20 mEq/L 100 mL/hr at 07/08/16 0829     LOS: 0 days   Chipper Oman, MD [facility] Pager [phone]  If 7PM-7AM, please contact night-coverage [email or web address] Password [email or web address] 07/08/2016, 4:28 PM

## 2016-07-08 NOTE — Progress Notes (Signed)
Pt. Arrived on unit AMS with daughter at bed side.  Non verbal and NPO.  She is resting.  Will continue to monitor.

## 2016-07-09 DIAGNOSIS — Z515 Encounter for palliative care: Secondary | ICD-10-CM

## 2016-07-09 DIAGNOSIS — I350 Nonrheumatic aortic (valve) stenosis: Secondary | ICD-10-CM

## 2016-07-09 LAB — BASIC METABOLIC PANEL
ANION GAP: 3 — AB (ref 5–15)
ANION GAP: 3 — AB (ref 5–15)
BUN: 28 mg/dL — ABNORMAL HIGH (ref 6–20)
BUN: 24 mg/dL — ABNORMAL HIGH (ref 6–20)
CALCIUM: 9.1 mg/dL (ref 8.9–10.3)
CALCIUM: 9.3 mg/dL (ref 8.9–10.3)
CHLORIDE: 119 mmol/L — AB (ref 101–111)
CO2: 30 mmol/L (ref 22–32)
CO2: 29 mmol/L (ref 22–32)
Chloride: 118 mmol/L — ABNORMAL HIGH (ref 101–111)
Creatinine, Ser: 0.42 mg/dL — ABNORMAL LOW (ref 0.44–1.00)
Creatinine, Ser: 0.39 mg/dL — ABNORMAL LOW (ref 0.44–1.00)
GFR calc Af Amer: >60 mL/min (ref >60)
GFR calc Af Amer: >60 mL/min (ref >60)
GFR calc non Af Amer: >60 mL/min (ref >60)
GFR calc non Af Amer: >60 mL/min (ref >60)
GLUCOSE: 130 mg/dL — AB (ref 65–99)
GLUCOSE: 108 mg/dL — AB (ref 65–99)
Potassium: 3.5 mmol/L (ref 3.5–5.1)
Potassium: 3.6 mmol/L (ref 3.5–5.1)
Sodium: 151 mmol/L — ABNORMAL HIGH (ref 135–145)
Sodium: 151 mmol/L — ABNORMAL HIGH (ref 135–145)

## 2016-07-09 LAB — CBC WITH DIFFERENTIAL/PLATELET
BASOS ABS: 0 10*3/uL (ref 0.0–0.1)
Basophils Relative: 0 %
Eosinophils Absolute: 0.2 10*3/uL (ref 0.0–0.7)
Eosinophils Relative: 2 %
HEMATOCRIT: 33.2 % — AB (ref 36.0–46.0)
Hemoglobin: 10 g/dL — ABNORMAL LOW (ref 12.0–15.0)
LYMPHS PCT: 15 %
Lymphs Abs: 1.7 10*3/uL (ref 0.7–4.0)
MCH: 27.9 pg (ref 26.0–34.0)
MCHC: 30.1 g/dL (ref 30.0–36.0)
MCV: 92.7 fL (ref 78.0–100.0)
MONO ABS: 1.5 10*3/uL — AB (ref 0.1–1.0)
MONOS PCT: 13 %
NEUTROS ABS: 7.6 10*3/uL (ref 1.7–7.7)
Neutrophils Relative %: 70 %
Platelets: 160 10*3/uL (ref 150–400)
RBC: 3.58 MIL/uL — ABNORMAL LOW (ref 3.87–5.11)
RDW: 15.1 % (ref 11.5–15.5)
WBC: 11 10*3/uL — ABNORMAL HIGH (ref 4.0–10.5)

## 2016-07-09 LAB — URINE CULTURE: Culture: NO GROWTH

## 2016-07-09 MED ORDER — DEXTROSE 5 % IV SOLN: INTRAVENOUS | Status: DC

## 2016-07-09 NOTE — Progress Notes (Signed)
PROGRESS NOTE [facility]   Whitney Marquez   [id number] DOB: 03/27/1923  DOA: 07/07/2016 PCP: Mathews Argyle, MD   Brief Narrative:   Whitney Marquez is a 82 y.o. female with medical history significant of aortic stenosis, osteoarthritis, history of breast cancer, history of colon cancer, cataracts, hypertension, scoliosis who was discharged from the hospital yesterday due to acute metabolic encephalopathy and now is being brought back to the emergency department via EMS from [facility] for evaluation of AMS.  Subjective:  Patient seen and examined with daughter at bedside. Significant improvement from yesterday. Patient denies chest pain shortness of breath and dizziness. No acute events overnight.  Assessment & Plan:   Altered mental status -  Continue IV fluids and comfort care measures. No obvious source of infection and afebrile Correct hypernatremia.  Palliative care evaluation pending   Hypernatraemia - no improvement Continue gentle IV hydration with D5W  Follow-up sodium level in the morning.  Subdural hygroma Not a good surgical candidate.  Anemia Monitor H&H.  Essential hypertension Hold antihypertensive therapy for now. Continue IV hydration. Monitor blood pressure.  Leukocytosis No obvious source of infections and afebrile - trending down Still awaiting for urine analysis. We'll continue to observe. Follow WBC in the morning.  Urinary retention - unclear etiology, meds reviewed Bladder scan every 8 hours In and out cath if bladder scan show more than 400 mL Avoid anticholinergic medications  DVT prophylaxis:SCDs. Code Status:DO NOT RESUSCITATE. Family Communication:Her daughter was present at the bedside. Disposition Plan:Palliative care discussion pending.   Consultants:   Palliative care  Antimicrobials:   Objective: Vitals:   07/08/16 1333 07/08/16 2122 07/09/16 0535 07/09/16 1358  BP: 131/71 133/62  127/75 (!) 102/43  Pulse: 88 79 76 70  Resp: 18 19 20    Temp: 97.6 F (36.4 C) 98.2 F (36.8 C) 97.5 F (36.4 C) 98 F (36.7 C)  TempSrc: Oral Oral Oral Oral  SpO2: 98% 98% 95% 97%    Intake/Output Summary (Last 24 hours) at 07/09/16 1705 Last data filed at 07/09/16 [id number]  Gross per 24 hour  Intake          1448.33 ml  Output              325 ml  Net          1123.33 ml   There were no vitals filed for this visit.  Examination:  General exam: Appears calm and comfortable  HEENT: AC/AT, PERRLA, OP dry and clear Respiratory system: Clear to auscultation. No wheezes,crackle or rhonchi Cardiovascular system: S1 & S2 heard, RRR Systolic murmur 3/6. No JVD, rubs or gallops Gastrointestinal system: Abdomen is nondistended, soft and nontender. No organomegaly or masses felt.  Central nervous system: Confused. No focal neurological deficits. Follow commands  Extremities: No pedal edema. Symmetric, strength 5/5   Skin: No rashes or ulcers, ecchymosis extending from her R frontal area down to her neck.  Psychiatry: Judgement and insight appear normal. Mood & affect appropriate.    Data Reviewed: I have personally reviewed following labs and imaging studies  CBC:  Recent Labs Lab 07/02/16 2010  07/05/16 0442 07/06/16 0628 07/07/16 1502 07/08/16 0446 07/09/16 0514  WBC 11.5*  < > 12.4* 15.5* 15.1* 12.3* 11.0*  NEUTROABS 7.9*  --   --   --  11.5*  --  7.6  HGB 11.8*  < > 11.2* 11.5* 12.7 10.8* 10.0*  HCT 36.2  < > 34.8* 35.9* 40.2 35.3* 33.2*  MCV 87.9  < >  88.3 89.1 91.6 92.2 92.7  PLT 268  < > 337 341 314 233 160  < > = values in this interval not displayed. Basic Metabolic Panel:  Recent Labs Lab 07/05/16 0442  07/06/16 0628 07/07/16 1502 07/08/16 0446 07/09/16 0514 07/09/16 1502  NA 136  < > 144 152* 153* 151* 151*  K 2.9*  < > 3.5 3.6 3.5 3.5 3.6  CL 102  < > 104 113* 118* 118* 119*  CO2 24  < > 19* 24 27 30 29   GLUCOSE 79  < > 100* 109* 147* 130* 108*  BUN  33*  < > 31* 47* 40* 28* 24*  CREATININE 0.65  < > 0.75 0.86 0.53 0.42* 0.39*  CALCIUM 9.0  < > 9.3 10.3 9.3 9.1 9.3  MG 2.0  --   --   --   --   --   --   < > = values in this interval not displayed. GFR: Estimated Creatinine Clearance: 37.9 mL/min (by C-G formula based on SCr of 0.39 mg/dL (L)). Liver Function Tests:  Recent Labs Lab 07/02/16 2010 07/07/16 1502 07/08/16 0446  AST 23 46* 34  ALT 17 38 31  ALKPHOS 42 50 40  BILITOT 0.6 1.2 1.0  PROT 6.2* 6.7 5.6*  ALBUMIN 3.5 3.6 2.9*    Recent Labs Lab 07/02/16 2010  LIPASE 40   No results for input(s): AMMONIA in the last 168 hours. Coagulation Profile:  Recent Labs Lab 07/02/16 2010  INR 1.01   Cardiac Enzymes:  Recent Labs Lab 07/07/16 1540 07/07/16 2328 07/08/16 0446  TROPONINI 0.15* 0.11* 0.10*   BNP (last 3 results) No results for input(s): PROBNP in the last 8760 hours. HbA1C: No results for input(s): HGBA1C in the last 72 hours. CBG:  Recent Labs Lab 07/07/16 1437  GLUCAP 106*   Lipid Profile: No results for input(s): CHOL, HDL, LDLCALC, TRIG, CHOLHDL, LDLDIRECT in the last 72 hours. Thyroid Function Tests: No results for input(s): TSH, T4TOTAL, FREET4, T3FREE, THYROIDAB in the last 72 hours. Anemia Panel: No results for input(s): VITAMINB12, FOLATE, FERRITIN, TIBC, IRON, RETICCTPCT in the last 72 hours. Sepsis Labs:  Recent Labs Lab 07/02/16 2028 07/07/16 1517  LATICACIDVEN 0.91 1.38    Recent Results (from the past 240 hour(s))  Urine culture     Status: Abnormal   Collection Time: 06/30/16  4:15 PM  Result Value Ref Range Status   Specimen Description URINE, RANDOM  Final   Special Requests NONE  Final   Culture (A)  Final    <10,000 COLONIES/mL INSIGNIFICANT GROWTH Performed at [facility]    Report Status 07/02/2016 FINAL  Final  Urine culture     Status: Abnormal   Collection Time: 07/02/16  9:42 PM  Result Value Ref Range Status   Specimen Description Urine   Final   Special Requests NONE  Final   Culture <10,000 COLONIES/mL INSIGNIFICANT GROWTH (A)  Final   Report Status 07/04/2016 FINAL  Final  MRSA PCR Screening     Status: None   Collection Time: 07/04/16  5:23 AM  Result Value Ref Range Status   MRSA by PCR NEGATIVE NEGATIVE Final    Comment:        The GeneXpert MRSA Assay (FDA approved for NASAL specimens only), is one component of a comprehensive MRSA colonization surveillance program. It is not intended to diagnose MRSA infection nor to guide or monitor treatment for MRSA infections.   Blood Culture (routine x 2)  Status: None (Preliminary result)   Collection Time: 07/07/16  2:45 PM  Result Value Ref Range Status   Specimen Description BLOOD RIGHT FOREARM  Final   Special Requests BOTTLES DRAWN AEROBIC AND ANAEROBIC 10CC  Final   Culture NO GROWTH 2 DAYS  Final   Report Status PENDING  Incomplete  Blood Culture (routine x 2)     Status: None (Preliminary result)   Collection Time: 07/07/16  2:50 PM  Result Value Ref Range Status   Specimen Description BLOOD RIGHT HAND  Final   Special Requests BOTTLES DRAWN AEROBIC AND ANAEROBIC 5CC  Final   Culture NO GROWTH 2 DAYS  Final   Report Status PENDING  Incomplete  Urine culture     Status: None   Collection Time: 07/08/16  6:28 AM  Result Value Ref Range Status   Specimen Description URINE, RANDOM  Final   Special Requests NONE  Final   Culture NO GROWTH  Final   Report Status 07/09/2016 FINAL  Final      Radiology Studies: Ct Head Wo Contrast  Result Date: 07/07/2016 CLINICAL DATA:  Altered mental status. Patient unresponsive. Hypoxia. Recent fall. EXAM: CT HEAD WITHOUT CONTRAST TECHNIQUE: Contiguous axial images were obtained from the base of the skull through the vertex without intravenous contrast. COMPARISON:  CT head 07/02/2016.  Initial CT head 06/30/2016. FINDINGS: Brain: Global atrophy. Small vessel disease. No hydrocephalus or intra-axial mass lesion. No  features are seen to confirm cerebral infarction or anoxic injury. Increasing BILATERAL extra-axial CSF like fluid collections consistent with hygromas. 13 mm thick on the RIGHT and 12 mm thick on the LEFT, significant progression from most recent study 5 days ago. Sulcal flattening is present, without herniation. Vascular: No hyperdense vessel or unexpected calcification. Skull: Normal. Negative for fracture or focal lesion. RIGHT frontal scalp hematoma redemonstrated. Sinuses/Orbits: No acute finding. Other: None. IMPRESSION: Significant increase in BILATERAL subdural hygromas since 06/30/16 and 07/02/2016. LEFT untreated, increased intracranial pressure or herniation could occur. Depending on the patient's clinical condition, neurosurgical consultation may be warranted. These results were called by telephone at the time of interpretation on 07/07/2016 at 5:29 pm to Dr. Jola Schmidt , who verbally acknowledged these results. Electronically Signed   By: Staci Righter M.D.   On: 07/07/2016 17:31    Scheduled Meds: . aspirin EC  81 mg Oral Q breakfast  . calcium-vitamin D  1 tablet Oral TID WC  . chlorhexidine  15 mL Mouth Rinse BID  . mouth rinse  15 mL Mouth Rinse q12n4p  . pantoprazole  40 mg Oral Daily  . polyvinyl alcohol  2 drop Both Eyes TID  . sodium chloride flush  3 mL Intravenous Q12H   Continuous Infusions:     LOS: 1 day   Chipper Oman, MD [facility] Pager [phone]  If 7PM-7AM, please contact night-coverage [email or web address] Password [email or web address] 07/09/2016, 5:05 PM

## 2016-07-09 NOTE — Consult Note (Signed)
Consultation Note Date: 07/09/2016   Patient Name: Whitney Marquez  DOB: [birth year]  MRN: [id number]  Age / Sex: 82 y.o., female  PCP: Lajean Manes, MD Referring Physician: Doreatha Lew, MD  Reason for Consultation: Establishing goals of care and Psychosocial/spiritual support  HPI/Patient Profile: 82 y.o. female  with past medical history of Aortic valve stenosis, arthritis, breast and colon cancer, cataracts, hypertension, plantar keratosis, scoliosis, admitted on 07/07/2016 with altered mental status from [place] facility. This is patient's third hospitalization since September 23 at which time she was admitted for altered mental status, slurred speech. Her workup was negative for any acute processes such as a stroke. She was found to have a urinary tract infection and was discharged on 06/26/2016. On 06/30/2016 she presented to the emergency room after a fall and was also found to have another UTI. She was readmitted from 07/03/2016 to 07/06/2016 with again slurred speech dehydration. Workup was negative again for a CVA. She was brought in again on 07/07/2016 with altered mental status and dehydration. Per family [facility] was requesting the patient be admitted to have a PICC line placed because they were unable to start a peripheral IV to hydrate her. Family shares at that time they felt like she was "transitioning towards end-of-life"; she has not been eating drinking and had been minimally responsive. CT scan of the head performed on 07/07/2016 shows global atrophy as well as a significant increase in bilateral CSF fluid collection consistent with hygroma; as compared to scan performed on 06/30/2016   Clinical Assessment and Goals of Care: Family shares today is the best they have seen their mother. She has had multiple visitors today they report and she has been communicating with them  appropriately and has been alert. At the time I see her today she is tired but she does greet me answer a few simple questions but then becomes very sleepy and begins to have trouble communicating. Patient has had a modified barium swallow performed on 07/06/2016 where a dysphagia 1 diet was recommended. She at that time was beginning to require multiple cues to swallow and was showing difficulty with thin liquids  Spent an extensive period of time with family regarding education on dementia and what to expect going forward and potential decisions to discuss regarding goals of care in the setting of clinical decline. We discussed how debility contributes to recurrent urinary tract infections, falls, difficulty swallowing and maintaining hydration and nutritional status. We discussed artificial hydration and nutrition with dementia and at end-of-life  Daughters Whitney Marquez and Whitney Marquez are her surrogate decision makers. Family is hopeful that patient will have another good day tomorrow and she can participate in goals of care discussion specifically as they relate to PEG tubes, IV fluids and antibiotics and return to the hospital for treatment    SUMMARY OF RECOMMENDATIONS   DNR/DNI Follow-up family meeting scheduled for 07/10/2016 at 9:00 to further discuss PEG tube in the setting of dysphasia and dementia, risks and benefits of IV fluids at end-of-life, risks  and benefits of antibiotics Issues revolving around quality of life as well as the difficult position of when are our interventions prolonging life versus prolonging death Family would be interested in pursuing a repeat swallow evaluation given the severity of her decline over the past month as well as her recent poor by mouth intake Code Status/Advance Care Planning:  DNR    Symptom Management:   Pain: Patient has been complaining of right knee pain but denies headache or pain to her face continue with Tylenol for now until we see patient's  level of consciousness going forward  Palliative Prophylaxis:   Aspiration, Bowel Regimen, Delirium Protocol, Eye Care, Frequent Pain Assessment, Oral Care and Turn Reposition  Additional Recommendations (Limitations, Scope, Preferences):  No Surgical Procedures and No Tracheostomy  Psycho-social/Spiritual:   Desire for further Chaplaincy support:no  Additional Recommendations: Grief/Bereavement Support  Prognosis:   Unable to determine  Discharge Planning: Anticipated [facility] to [facility] but at a higher level of care from independent living      Primary Diagnoses: Present on Admission: . Altered mental status . Anemia . Essential hypertension . Leukocytosis . Subdural hygroma . Elevated troponin . Hypernatremia   I have reviewed the medical record, interviewed the patient and family, and examined the patient. The following aspects are pertinent.  Past Medical History:  Diagnosis Date  . Aortic valve stenosis   . Arthritis   . Cancer [facility])    breast and colon  . Cancer ([place])   . Cataract   . Cataracts, both eyes   . Hypertension   . Plantar keratosis   . Scoliosis    Social History   Social History  . Marital status: Married    Spouse name: N/A  . Number of children: N/A  . Years of education: N/A   Occupational History  .      Social History Main Topics  . Smoking status: Never Smoker  . Smokeless tobacco: Never Used  . Alcohol use No  . Drug use: Unknown  . Sexual activity: Not Asked   Other Topics Concern  . None   Social History Narrative   ** Merged History Encounter **       History reviewed. No pertinent family history. Scheduled Meds: . aspirin EC  81 mg Oral Q breakfast  . calcium-vitamin D  1 tablet Oral TID WC  . chlorhexidine  15 mL Mouth Rinse BID  . mouth rinse  15 mL Mouth Rinse q12n4p  . pantoprazole  40 mg Oral Daily  . polyvinyl alcohol  2 drop Both Eyes TID  . sodium chloride flush  3 mL Intravenous Q12H     Continuous Infusions:  PRN Meds:.acetaminophen, lidocaine, polyvinyl alcohol Medications Prior to Admission:  Prior to Admission medications   Medication Sig Start Date End Date Taking? Authorizing Provider  acetaminophen (TYLENOL) 325 MG tablet Take 2 tablets (650 mg total) by mouth every 6 (six) hours as needed for mild pain (or Fever >/= 101). 07/06/16  Yes Geradine Girt, DO  Artificial Saliva (BIOTENE DRY MOUTH MOISTURIZING) SOLN See admin instructions. 1-2 sprays into the mouth three times a day   Yes Historical Provider, MD  aspirin 81 MG tablet Take 81 mg by mouth daily with breakfast.    Yes Historical Provider, MD  Calcium-Vitamin D-Vitamin K 500-100-40 MG-UNT-MCG CHEW Chew 1 tablet by mouth 3 (three) times daily with meals.    Yes Historical Provider, MD  Hypromellose (ARTIFICIAL TEARS) 0.4 % SOLN Apply 2 drops to  eye 3 (three) times daily.   Yes Historical Provider, MD  lidocaine (LIDODERM) 5 % Place 2 patches onto the skin daily. Remove & Discard patch within 12 hours or as directed by MD (knee and back as needed for pain) Patient taking differently: Place 1 patch onto the skin daily. Remove & Discard patch within 12 hours or as directed by MD (apply to affected knee) 07/06/16  Yes Geradine Girt, DO  omeprazole (PRILOSEC) 20 MG capsule Take 20 mg by mouth daily.   Yes Historical Provider, MD  Polyvinyl Alcohol-Povidone (REFRESH OP) Apply 2 drops to eye at bedtime as needed (for dry eyes).    Yes Historical Provider, MD  Maltodextrin-Xanthan Gum (RESOURCE THICKENUP CLEAR) POWD Pudding thick liquids Patient not taking: Reported on 07/07/2016 07/06/16   Geradine Girt, DO   Allergies  Allergen Reactions  . Cephalosporins Anaphylaxis  . Ciprofloxacin Other (See Comments)    Son reports seizure-like activity  . Lisinopril Other (See Comments)    UNKNOWN REACTION on MAR  . Other Swelling    Walnuts causes lips swelling and ALL NUTS, per [facility] ANESTHESIA (as noted on MAR)  . Pyridium  [Phenazopyridine Hcl] Other (See Comments)    UNKNOWN  . Aleve [Naproxen] Rash  . Iodine Itching and Rash  . Macrobid [Nitrofurantoin Monohyd Macro] Swelling and Rash  . Penicillins Hives and Rash    Has patient had a PCN reaction causing immediate rash, facial/tongue/throat swelling, SOB or lightheadedness with hypotension: Yes Has patient had a PCN reaction causing severe rash involving mucus membranes or skin necrosis: Unknown Has patient had a PCN reaction that required hospitalization: Unknown Has patient had a PCN reaction occurring within the last 10 years: No If all of the above answers are "NO", then may proceed with Cephalosporin use.   . Shellfish Allergy Itching and Rash   Review of Systems  Unable to perform ROS: Mental status change    Physical Exam  Constitutional: She appears well-developed.  Cachetic, frail elderly female.   Neck: Normal range of motion.  Pulmonary/Chest: Effort normal.  Neurological:  somnolent but opens eyes to voice  Skin: Skin is warm and dry.  Extensive ecchymosis to right side of face and neck from fall sustained 9/29  Psychiatric:  Unable to test. Very sleepy  Nursing note and vitals reviewed.   Vital Signs: BP (!) 102/43 (BP Location: Right Arm)   Pulse 70   Temp 98 F (36.7 C) (Oral)   Resp 20   SpO2 97%  Pain Assessment: No/denies pain   Pain Score: 0-No pain   SpO2: SpO2: 97 % O2 Device:SpO2: 97 % O2 Flow Rate: .O2 Flow Rate (L/min): 3 L/min  IO: Intake/output summary:  Intake/Output Summary (Last 24 hours) at 07/09/16 1757 Last data filed at 07/09/16 [id number]  Gross per 24 hour  Intake          1448.33 ml  Output              325 ml  Net          1123.33 ml    LBM: Last BM Date: 07/07/16 Baseline Weight:   Most recent weight:       Palliative Assessment/Data:   Flowsheet Rows   Flowsheet Row Most Recent Value  Intake Tab  Referral Department  Hospitalist  Unit at Time of Referral  Med/Surg Unit  Palliative  Care Primary Diagnosis  Other (Comment)  Date Notified  07/08/16  Palliative Care Type  New Palliative  care  Reason for referral  Clarify Goals of Care  Date of Admission  07/07/16  Date first seen by Palliative Care  07/09/16  # of days Palliative referral response time  1 Day(s)  # of days IP prior to Palliative referral  1  Clinical Assessment  Palliative Performance Scale Score  30%  Pain Max last 24 hours  Not able to report  Pain Min Last 24 hours  Not able to report  Dyspnea Max Last 24 Hours  Not able to report  Dyspnea Min Last 24 hours  Not able to report  Nausea Max Last 24 Hours  Not able to report  Nausea Min Last 24 Hours  Not able to report  Anxiety Max Last 24 Hours  Not able to report  Anxiety Min Last 24 Hours  Not able to report  Other Max Last 24 Hours  Not able to report  Psychosocial & Spiritual Assessment  Palliative Care Outcomes  Patient/Family meeting held?  Yes  Who was at the meeting?  1 daughter, SIL, pt's sister  Palliative Care Outcomes  Provided psychosocial or spiritual support  Palliative Care follow-up planned  Yes, Facility      Time In: 1700 Time Out: 1810 Time Total: 70 min Greater than 50%  of this time was spent counseling and coordinating care related to the above assessment and plan.  Signed by: Dory Horn, NP   Please contact Palliative Medicine Team phone at [phone] for questions and concerns.  For individual provider: See Shea Evans

## 2016-07-10 ENCOUNTER — Inpatient Hospital Stay (HOSPITAL_COMMUNITY): Payer: Medicare Other

## 2016-07-10 DIAGNOSIS — Z7189 Other specified counseling: Secondary | ICD-10-CM

## 2016-07-10 DIAGNOSIS — E870 Hyperosmolality and hypernatremia: Principal | ICD-10-CM

## 2016-07-10 LAB — CBC WITH DIFFERENTIAL/PLATELET
Basophils Absolute: 0 10*3/uL (ref 0.0–0.1)
Basophils Relative: 0 %
EOS PCT: 2 %
Eosinophils Absolute: 0.2 10*3/uL (ref 0.0–0.7)
HCT: 35 % — ABNORMAL LOW (ref 36.0–46.0)
Hemoglobin: 10.6 g/dL — ABNORMAL LOW (ref 12.0–15.0)
LYMPHS ABS: 1.5 10*3/uL (ref 0.7–4.0)
LYMPHS PCT: 12 %
MCH: 28.3 pg (ref 26.0–34.0)
MCHC: 30.3 g/dL (ref 30.0–36.0)
MCV: 93.6 fL (ref 78.0–100.0)
MONO ABS: 1.6 10*3/uL — AB (ref 0.1–1.0)
Monocytes Relative: 13 %
Neutro Abs: 9.2 10*3/uL — ABNORMAL HIGH (ref 1.7–7.7)
Neutrophils Relative %: 73 %
PLATELETS: 134 10*3/uL — AB (ref 150–400)
RBC: 3.74 MIL/uL — ABNORMAL LOW (ref 3.87–5.11)
RDW: 15.1 % (ref 11.5–15.5)
WBC: 12.5 10*3/uL — ABNORMAL HIGH (ref 4.0–10.5)

## 2016-07-10 LAB — BASIC METABOLIC PANEL
Anion gap: 7 (ref 5–15)
BUN: 23 mg/dL — ABNORMAL HIGH (ref 6–20)
CO2: 32 mmol/L (ref 22–32)
Calcium: 9.2 mg/dL (ref 8.9–10.3)
Chloride: 113 mmol/L — ABNORMAL HIGH (ref 101–111)
Creatinine, Ser: 0.45 mg/dL (ref 0.44–1.00)
GFR calc Af Amer: >60 mL/min (ref >60)
GFR calc non Af Amer: >60 mL/min (ref >60)
GLUCOSE: 97 mg/dL (ref 65–99)
Potassium: 3.5 mmol/L (ref 3.5–5.1)
Sodium: 152 mmol/L — ABNORMAL HIGH (ref 135–145)

## 2016-07-10 MED ORDER — DEXTROSE 5 % IV SOLN
INTRAVENOUS | Status: DC
  Administered 2016-07-10: 09:00:00 via INTRAVENOUS

## 2016-07-10 MED ORDER — RESOURCE THICKENUP CLEAR PO POWD
ORAL | Status: DC | PRN
  Filled 2016-07-10: qty 125

## 2016-07-10 NOTE — Progress Notes (Addendum)
PROGRESS NOTE [facility]   Whitney Marquez   [id number] DOB: [birth year]  DOA: 07/07/2016 PCP: Mathews Argyle, MD   Brief Narrative:   Whitney Marquez is a 82 y.o. female with medical history significant of aortic stenosis, osteoarthritis, history of breast cancer, history of colon cancer, cataracts, hypertension, scoliosis who was discharged from the hospital yesterday due to acute metabolic encephalopathy and now is being brought back to the emergency department via EMS from [facility] for evaluation of AMS.  Subjective:  Patient seen and examined with daughter at bedside. Significant improvement. No acute events overnight.  Assessment & Plan:   Altered mental status - Improving, ? How much this is part dementia.  Continue IV fluids and comfort care measures. No obvious source of infection and afebrile Correcting hypernatremia.  Palliative care recommendations appreciated.   Hypernatraemia - slow improvement,  Continue gentle IV hydration with D5W   Subdural hygroma Not a good surgical candidate.  Anemia Monitor H&H.  Essential hypertension, ECHO 07/04/16 EF 65% - 70%  Hold antihypertensive therapy for now. Continue IV hydration. Monitor blood pressure.  Leukocytosis - Increase today - no sources of clear infection, CXR negative, UA negative, could be secondary to dehydration Continues IVF No obvious source of infections and afebrile Still awaiting for urine analysis. We'll continue to observe. CBC in AM   Urinary retention - unclear etiology, meds reviewed Bladder scan every 8 hours In and out cath if bladder scan show more than 400 mL Avoid anticholinergic medications  DVT prophylaxis:SCDs. Code Status:DO NOT RESUSCITATE. Family Communication:Her daughter was present at the bedside. Disposition Plan:   Consultants:   Palliative care  Antimicrobials:   Objective: Vitals:   07/09/16 0535 07/09/16 1358 07/09/16 2150  07/10/16 0559  BP: 127/75 (!) 102/43 135/69 (!) 143/76  Pulse: 76 70 79 79  Resp: 20  19 17   Temp: 97.5 F (36.4 C) 98 F (36.7 C) 97.8 F (36.6 C) 97.5 F (36.4 C)  TempSrc: Oral Oral Oral Oral  SpO2: 95% 97% 97% 99%    Intake/Output Summary (Last 24 hours) at 07/10/16 0909 Last data filed at 07/10/16 0428  Gross per 24 hour  Intake                0 ml  Output             1100 ml  Net            -1100 ml   There were no vitals filed for this visit.  Examination:  General exam: Appears calm and comfortable, some what confuse  HEENT: OP dry and clear Respiratory system: Clear to auscultation. Slight crackles at the left base. No wheezes, or rhonchi Cardiovascular system: S1 & S2 heard, RRR Systolic murmur 3/6. No JVD, rubs or gallops Gastrointestinal system: Abdomen is nondistended, soft and nontender. No organomegaly or masses felt.  Central nervous system: No focal neurological deficits. Follow commands  Extremities: No pedal edema. Symmetric, strength 5/5   Skin: No rashes or ulcers, ecchymosis extending from her R frontal area down to her neck.  Psychiatry: Judgement and insight appear normal. Mood & affect appropriate.    Data Reviewed: I have personally reviewed following labs and imaging studies  CBC:  Recent Labs Lab 07/06/16 0628 07/07/16 1502 07/08/16 0446 07/09/16 0514 07/10/16 0534  WBC 15.5* 15.1* 12.3* 11.0* 12.5*  NEUTROABS  --  11.5*  --  7.6 9.2*  HGB 11.5* 12.7 10.8* 10.0* 10.6*  HCT 35.9* 40.2 35.3*  33.2* 35.0*  MCV 89.1 91.6 92.2 92.7 93.6  PLT 341 314 233 160 [id number]*   Basic Metabolic Panel:  Recent Labs Lab 07/05/16 0442  07/07/16 1502 07/08/16 0446 07/09/16 0514 07/09/16 1502 07/10/16 0534  NA 136  < > 152* 153* 151* 151* 152*  K 2.9*  < > 3.6 3.5 3.5 3.6 3.5  CL 102  < > 113* 118* 118* 119* 113*  CO2 24  < > 24 27 30 29  32  GLUCOSE 79  < > 109* 147* 130* 108* 97  BUN 33*  < > 47* 40* 28* 24* 23*  CREATININE 0.65  < > 0.86 0.53  0.42* 0.39* 0.45  CALCIUM 9.0  < > 10.3 9.3 9.1 9.3 9.2  MG 2.0  --   --   --   --   --   --   < > = values in this interval not displayed. GFR: Estimated Creatinine Clearance: 37.9 mL/min (by C-G formula based on SCr of 0.45 mg/dL). Liver Function Tests:  Recent Labs Lab 07/07/16 1502 07/08/16 0446  AST 46* 34  ALT 38 31  ALKPHOS 50 40  BILITOT 1.2 1.0  PROT 6.7 5.6*  ALBUMIN 3.6 2.9*   No results for input(s): LIPASE, AMYLASE in the last 168 hours. No results for input(s): AMMONIA in the last 168 hours. Coagulation Profile: No results for input(s): INR, PROTIME in the last 168 hours. Cardiac Enzymes:  Recent Labs Lab 07/07/16 1540 07/07/16 2328 07/08/16 0446  TROPONINI 0.15* 0.11* 0.10*   BNP (last 3 results) No results for input(s): PROBNP in the last 8760 hours. HbA1C: No results for input(s): HGBA1C in the last 72 hours. CBG:  Recent Labs Lab 07/07/16 1437  GLUCAP 106*   Lipid Profile: No results for input(s): CHOL, HDL, LDLCALC, TRIG, CHOLHDL, LDLDIRECT in the last 72 hours. Thyroid Function Tests: No results for input(s): TSH, T4TOTAL, FREET4, T3FREE, THYROIDAB in the last 72 hours. Anemia Panel: No results for input(s): VITAMINB12, FOLATE, FERRITIN, TIBC, IRON, RETICCTPCT in the last 72 hours. Sepsis Labs:  Recent Labs Lab 07/07/16 1517  LATICACIDVEN 1.38    Recent Results (from the past 240 hour(s))  Urine culture     Status: Abnormal   Collection Time: 06/30/16  4:15 PM  Result Value Ref Range Status   Specimen Description URINE, RANDOM  Final   Special Requests NONE  Final   Culture (A)  Final    <10,000 COLONIES/mL INSIGNIFICANT GROWTH Performed at [facility]    Report Status 07/02/2016 FINAL  Final  Urine culture     Status: Abnormal   Collection Time: 07/02/16  9:42 PM  Result Value Ref Range Status   Specimen Description Urine  Final   Special Requests NONE  Final   Culture <10,000 COLONIES/mL INSIGNIFICANT GROWTH (A)   Final   Report Status 07/04/2016 FINAL  Final  MRSA PCR Screening     Status: None   Collection Time: 07/04/16  5:23 AM  Result Value Ref Range Status   MRSA by PCR NEGATIVE NEGATIVE Final    Comment:        The GeneXpert MRSA Assay (FDA approved for NASAL specimens only), is one component of a comprehensive MRSA colonization surveillance program. It is not intended to diagnose MRSA infection nor to guide or monitor treatment for MRSA infections.   Blood Culture (routine x 2)     Status: None (Preliminary result)   Collection Time: 07/07/16  2:45 PM  Result Value Ref  Range Status   Specimen Description BLOOD RIGHT FOREARM  Final   Special Requests BOTTLES DRAWN AEROBIC AND ANAEROBIC 10CC  Final   Culture NO GROWTH 2 DAYS  Final   Report Status PENDING  Incomplete  Blood Culture (routine x 2)     Status: None (Preliminary result)   Collection Time: 07/07/16  2:50 PM  Result Value Ref Range Status   Specimen Description BLOOD RIGHT HAND  Final   Special Requests BOTTLES DRAWN AEROBIC AND ANAEROBIC 5CC  Final   Culture NO GROWTH 2 DAYS  Final   Report Status PENDING  Incomplete  Urine culture     Status: None   Collection Time: 07/08/16  6:28 AM  Result Value Ref Range Status   Specimen Description URINE, RANDOM  Final   Special Requests NONE  Final   Culture NO GROWTH  Final   Report Status 07/09/2016 FINAL  Final      Radiology Studies: No results found.  Scheduled Meds: . aspirin EC  81 mg Oral Q breakfast  . calcium-vitamin D  1 tablet Oral TID WC  . chlorhexidine  15 mL Mouth Rinse BID  . mouth rinse  15 mL Mouth Rinse q12n4p  . pantoprazole  40 mg Oral Daily  . polyvinyl alcohol  2 drop Both Eyes TID  . sodium chloride flush  3 mL Intravenous Q12H   Continuous Infusions:     LOS: 2 days   Chipper Oman, MD [facility] Pager [phone]  If 7PM-7AM, please contact night-coverage [email or web address] Password [email or web address] 07/10/2016, 9:09 AM

## 2016-07-10 NOTE — Care Management Note (Signed)
Case Management Note  Patient Details  Name: BERENDA GARRY MRN: [id number] Date of Birth: 01-23-1923  Subjective/Objective:                 Patient from [place]. Palliative consult rec SNF with MOST form and palliative or hospice care at DC. Patient not appropriate for residential hospice at this time. MBS completed today.    Action/Plan:  Anticipate DC to SNF in next 24 hours. CSW consulted.  Expected Discharge Date:                  Expected Discharge Plan:  Skilled Nursing Facility  In-House Referral:  Clinical Social Work  Discharge planning Services  CM Consult  Post Acute Care Choice:  NA Choice offered to:  NA  DME Arranged:  N/A DME Agency:  NA  HH Arranged:  NA HH Agency:  NA  Status of Service:  Completed, signed off  If discussed at [place] of Stay Meetings, dates discussed:    Additional Comments:  Carles Collet, RN 07/10/2016, 4:00 PM

## 2016-07-10 NOTE — Evaluation (Signed)
Clinical/Bedside Swallow Evaluation Patient Details  Name: Whitney Marquez MRN: [id number] Date of Birth: 1923/06/14  Today's Date: 07/10/2016 Time: SLP Start Time (ACUTE ONLY): [id number] SLP Stop Time (ACUTE ONLY): 0954 SLP Time Calculation (min) (ACUTE ONLY): 36 min  Past Medical History:  Past Medical History:  Diagnosis Date  . Aortic valve stenosis   . Arthritis   . Cancer [facility])    breast and colon  . Cancer ([place])   . Cataract   . Cataracts, both eyes   . Hypertension   . Plantar keratosis   . Scoliosis    Past Surgical History:  Past Surgical History:  Procedure Laterality Date  . BACK SURGERY    . BREAST SURGERY     breast cancer  . COLON SURGERY     colon cancer  . COLON SURGERY    . LUMBAR LAMINECTOMY    . meniscus tear    . TOE AMPUTATION     second toe   HPI:  Whitney B Holmesis a 82 y.o.woman with a history of aortic stenosis, HTN, HLD, and breast and colon cancers who up until recently, was a resident in an independent living facility. She was admitted to the hospital 9/23-9/25 after  with a UTI. She was discharged on full strength Bactrim. She was back in the ED on 9/29 after having a fall that resulted in a laceration over her right eye and extensive bruising involving most of her right face and neck. She was diagnosed with UTI again and was discharged to home with antibiotics. She was re-admitted 10/2 with acute metabolic encephalopathy due to polypharmacy, recent UTI. Pt seen by SLP, determined to have significant dysphagia, but family wished to d/c quite early to return to familiar environment. Readmitted 10/6 for AMS and dehydration. Palliative care consulted to aid in discussion of end of life given progressive decline.    Assessment / Plan / Recommendation Clinical Impression  Patient demonstrates improvement in oral hygiene, strength, attention and awareness since last seen by acute SLP on 10/5. However, despite general improvement, there are overt  signs of aspiration with thin liquids (wet vocal quality, cough). Three daughters are present and after discussion regarding different options given findings, they would like to proceed with an MBS for objective assessment to aid in plan of care. Recommend pt consume ice chips as desired, but otherwise remian NPO until MBS today at 1330.     Aspiration Risk  Severe aspiration risk;Risk for inadequate nutrition/hydration    Diet Recommendation NPO;Ice chips PRN after oral care        Other  Recommendations Oral Care Recommendations: Oral care QID   Follow up Recommendations Skilled Nursing facility      Frequency and Duration min 2x/week  2 weeks       Prognosis Prognosis for Safe Diet Advancement: Good Barriers to Reach Goals: Cognitive deficits      Swallow Study   General HPI: Whitney B Holmesis a 82 y.o.woman with a history of aortic stenosis, HTN, HLD, and breast and colon cancers who up until recently, was a resident in an independent living facility. She was admitted to the hospital 9/23-9/25 after  with a UTI. She was discharged on full strength Bactrim. She was back in the ED on 9/29 after having a fall that resulted in a laceration over her right eye and extensive bruising involving most of her right face and neck. She was diagnosed with UTI again and was discharged to home with antibiotics. She was  re-admitted 10/2 with acute metabolic encephalopathy due to polypharmacy, recent UTI. Pt seen by SLP, determined to have significant dysphagia, but family wished to d/c quite early to return to familiar environment. Readmitted 10/6 for AMS and dehydration. Palliative care consulted to aid in discussion of end of life given progressive decline.  Type of Study: Bedside Swallow Evaluation Previous Swallow Assessment: none Diet Prior to this Study: NPO Temperature Spikes Noted: No Respiratory Status: Nasal cannula History of Recent Intubation: No Behavior/Cognition:  Alert;Pleasant mood;Cooperative;Requires cueing Oral Cavity Assessment: Dry Oral Care Completed by SLP: Yes Oral Cavity - Dentition: Edentulous Vision: Functional for self-feeding Self-Feeding Abilities: Needs assist Patient Positioning: Upright in bed Baseline Vocal Quality: Normal Volitional Cough: Weak Volitional Swallow: Able to elicit    Oral/Motor/Sensory Function Overall Oral Motor/Sensory Function: Within functional limits   Ice Chips Ice chips: Impaired Presentation: Spoon Pharyngeal Phase Impairments: Suspected delayed Swallow;Wet Vocal Quality   Thin Liquid Thin Liquid: Impaired Presentation: Cup;Self Fed Pharyngeal  Phase Impairments: Suspected delayed Swallow;Multiple swallows;Wet Vocal Quality;Cough - Immediate    Nectar Thick Nectar Thick Liquid: Not tested   Honey Thick Honey Thick Liquid: Not tested   Puree Puree: Not tested   Solid   GO   Solid: Not tested       Herbie Baltimore, MA CCC-SLP [id number]  Yarelie Hams, Katherene Ponto 07/10/2016,9:59 AM

## 2016-07-10 NOTE — Progress Notes (Signed)
MBSS complete. Full report located under chart review in imaging section. Pt may begin purees and nectar thick liquids with full supervision for a chin tuck. Herbie Baltimore, [place] CCC-SLP [phone]

## 2016-07-10 NOTE — Consult Note (Signed)
   [facility] CM Inpatient Consult   07/10/2016  Whitney Marquez [birth year] [id number]   Patient screened for potential [facility] services for 3 admissions in the past 6 months. Dr. Lajean Manes listed as primary care provider.  Patient is eligible for [facility] Care Management services under patient's Medicare plan.  Chart review reveals patient will be followed by Palliative Care per their notes, the patient is a 82 y.o. female  with past medical history of Aortic valve stenosis, arthritis, breast and colon cancer, cataracts, hypertension, plantar keratosis, scoliosis, admitted on 07/07/2016 with altered mental status from [facility] facility. This is patient's third hospitalization since September 23 at which time she was admitted for altered mental status, slurred speech. Her workup was negative for any acute processes such as a stroke. She was found to have a urinary tract infection and was discharged on 06/26/2016. On 06/30/2016 she presented to the emergency room after a fall and was also found to have another UTI. She was readmitted from 07/03/2016 to 07/06/2016 with again slurred speech dehydration. Workup was negative again for a CVA. She was brought in again on 07/07/2016 with altered mental status and dehydration. Per family [facility] was requesting the patient be admitted to have a PICC line placed because they were unable to start a peripheral IV to hydrate her. Family shares at that time they felt like she was "transitioning towards end-of-life"; she has not been eating drinking and had been minimally responsive per notes.   Will follow progress for any changes in plan but as for now Palliative Care is most appropriate for post hospital follow up.   Please place a [facility] Care Management consult for any changes in current plan or needs or for questions contact:   Natividad Brood, RN BSN [facility] Liaison  [phone] business mobile  phone Toll free office [phone]

## 2016-07-10 NOTE — Progress Notes (Addendum)
Daily Progress Note   Patient Name: Whitney Marquez       Date: 07/10/2016 DOB: [birth year]  Age: 82 y.o. MRN#: [id number] Attending Physician: Doreatha Lew, MD Primary Care Physician: Mathews Argyle, MD Admit Date: 07/07/2016  Reason for Consultation/Follow-up: Disposition and Establishing goals of care  Subjective:  Patient mildly confused.  Met with 3 dtrs in conference room.  Discussed progressive decline over the last 6 months.  Great difficulty/frustration with simple decisions, decreased desire to eat and drink.   Discussed their Mother's CT Head.  Dtrs wanted to know what is reversible and what is not reversible.  Talked about global atrophy of the brain which is not reversible and in line with dementia.  Daughters are eager for results of PT and Speech evaluations.  Length of Stay: 2  Current Medications: Scheduled Meds:  . aspirin EC  81 mg Oral Q breakfast  . calcium-vitamin D  1 tablet Oral TID WC  . chlorhexidine  15 mL Mouth Rinse BID  . mouth rinse  15 mL Mouth Rinse q12n4p  . pantoprazole  40 mg Oral Daily  . polyvinyl alcohol  2 drop Both Eyes TID  . sodium chloride flush  3 mL Intravenous Q12H    Continuous Infusions: . dextrose 50 mL/hr at 07/10/16 0921    PRN Meds: acetaminophen, lidocaine, polyvinyl alcohol  Physical Exam     Very pleasant elderly female, bruising around right eye and cheek noted, NAD, sitting up in bed Mildly confused, mostly appropriate answers to questions, mumbling speech CV rrr, no obvious rhythm Resp CTA Abdomen soft Ext no edema noted    Vital Signs: BP (!) 143/76   Pulse 79   Temp 97.5 F (36.4 C) (Oral)   Resp 17   SpO2 99%  SpO2: SpO2: 99 % O2 Device: O2 Device: Nasal Cannula O2 Flow Rate: O2 Flow Rate  (L/min): 2 L/min  Intake/output summary:  Intake/Output Summary (Last 24 hours) at 07/10/16 0944 Last data filed at 07/10/16 0428  Gross per 24 hour  Intake                0 ml  Output             1100 ml  Net            -1100 ml  LBM: Last BM Date: 07/07/16 Baseline Weight:   Most recent weight:         Palliative Assessment/Data:    Flowsheet Rows   Flowsheet Row Most Recent Value  Intake Tab  Referral Department  Hospitalist  Unit at Time of Referral  Med/Surg Unit  Palliative Care Primary Diagnosis  Other (Comment)  Date Notified  07/08/16  Palliative Care Type  New Palliative care  Reason for referral  Clarify Goals of Care  Date of Admission  07/07/16  Date first seen by Palliative Care  07/09/16  # of days Palliative referral response time  1 Day(s)  # of days IP prior to Palliative referral  1  Clinical Assessment  Palliative Performance Scale Score  30%  Pain Max last 24 hours  Not able to report  Pain Min Last 24 hours  Not able to report  Dyspnea Max Last 24 Hours  Not able to report  Dyspnea Min Last 24 hours  Not able to report  Nausea Max Last 24 Hours  Not able to report  Nausea Min Last 24 Hours  Not able to report  Anxiety Max Last 24 Hours  Not able to report  Anxiety Min Last 24 Hours  Not able to report  Other Max Last 24 Hours  Not able to report  Psychosocial & Spiritual Assessment  Palliative Care Outcomes  Patient/Family meeting held?  Yes  Who was at the meeting?  1 daughter, SIL, pt's sister  Palliative Care Outcomes  Provided psychosocial or spiritual support  Palliative Care follow-up planned  Yes, Facility      Patient Active Problem List   Diagnosis Date Noted  . Palliative care encounter   . Altered mental status 07/07/2016  . Leukocytosis 07/07/2016  . Subdural hygroma 07/07/2016  . Elevated troponin 07/07/2016  . Hypernatremia 07/07/2016  . Cerebrovascular accident (CVA) ([place])   . Delirium 07/03/2016  . Acute metabolic  encephalopathy 36/64/4034  . Dehydration 07/03/2016  . History of recent fall 07/03/2016  . Right hip pain 07/03/2016  . Positive D-dimer 07/03/2016  . Aortic stenosis 07/03/2016  . Difficulty walking   . Muscle weakness (generalized)   . Confusion 06/24/2016  . CVA (cerebral infarction) 06/24/2016  . Lower urinary tract infectious disease 06/24/2016  . SIRS (systemic inflammatory response syndrome) ([place]) 10/20/2014  . Acute ischemic colitis ([place]) 10/19/2014  . Compression fracture of L1 lumbar vertebra with routine healing 10/19/2014  . Hypokalemia   . Anemia 10/15/2014  . Essential hypertension 10/15/2014  . Hyperlipidemia 10/15/2014    Palliative Care Assessment & Plan   Patient Profile: 82 y.o. female  with past medical history of Aortic valve stenosis, arthritis, breast and colon cancer, cataracts, hypertension, plantar keratosis, scoliosis, admitted on 07/07/2016 with altered mental status from [facility]. This is patient's third hospitalization since September 23 at which time she was admitted for altered mental status, slurred speech. Her workup was negative for any acute processes such as a stroke. She was found to have a urinary tract infection and was discharged on 06/26/2016. On 06/30/2016 she presented to the emergency room after a fall and was also found to have another UTI. She was readmitted from 07/03/2016 to 07/06/2016 with again slurred speech dehydration. Workup was negative again for a CVA. She was brought in again on 07/07/2016 with altered mental status and dehydration. Per family [facility] was requesting the patient be admitted to have a PICC line placed because they were unable to start a peripheral  IV to hydrate her. Family shares at that time they felt like she was "transitioning towards end-of-life"; she has not been eating drinking and had been minimally responsive. CT scan of the head performed on 07/07/2016 shows global atrophy as well as a significant increase  in bilateral CSF fluid collection consistent with hygroma; as compared to scan performed on 06/30/2016  Assessment: Patient improving, but does have progress dementia.  More lucid today.  Still hypernatremic.  Still aspirating.  MBSS eval later today.  PT eval pending.     Recommendations/Plan:  Pending Speech eval and Physical therapy evaluation  Likely to SNF.  Need to complete MOST form.   I believe their goals may be in line with Hospice Services, but need further conversation  Goals of Care and Additional Recommendations:  Limitations on Scope of Treatment: Avoid Hospitalization and No Artificial Feeding  Code Status:  DNR  Prognosis:   < 6 months - difficult to determine, but given fast decline, aspiration, and personal goals to avoid aggressive/heroic measures.  Discharge Planning:  [facility] likely SNF either with Hospice Services or Palliative Services.  Care plan was discussed with Speech, 3 dtrs Apolonio Schneiders, Nils Pyle)  Thank you for allowing the Palliative Medicine Team to assist in the care of this patient.   Time In: 9:00 Time Out: 9:35 Total Time 35  Prolonged Time Billed no      Greater than 50%  of this time was spent counseling and coordinating care related to the above assessment and plan.  Imogene Burn, PA-C Palliative Medicine   Please contact Palliative MedicineTeam phone at [phone] for questions and concerns.   Please see AMION for individual provider pager numbers.

## 2016-07-11 DIAGNOSIS — D181 Lymphangioma, any site: Secondary | ICD-10-CM

## 2016-07-11 DIAGNOSIS — F0391 Unspecified dementia with behavioral disturbance: Secondary | ICD-10-CM

## 2016-07-11 DIAGNOSIS — F03918 Unspecified dementia, unspecified severity, with other behavioral disturbance: Secondary | ICD-10-CM

## 2016-07-11 LAB — BASIC METABOLIC PANEL
Anion gap: 8 (ref 5–15)
BUN: 21 mg/dL — ABNORMAL HIGH (ref 6–20)
CHLORIDE: 107 mmol/L (ref 101–111)
CO2: 29 mmol/L (ref 22–32)
Calcium: 8.9 mg/dL (ref 8.9–10.3)
Creatinine, Ser: 0.36 mg/dL — ABNORMAL LOW (ref 0.44–1.00)
Glucose, Bld: 108 mg/dL — ABNORMAL HIGH (ref 65–99)
POTASSIUM: 3.1 mmol/L — AB (ref 3.5–5.1)
SODIUM: 144 mmol/L (ref 135–145)

## 2016-07-11 MED ORDER — POTASSIUM CHLORIDE 10 MEQ/100ML IV SOLN: 10.0000 meq | INTRAVENOUS | Status: DC

## 2016-07-11 MED ORDER — POTASSIUM CHLORIDE 20 MEQ/15ML (10%) PO SOLN
40.0000 meq | Freq: Once | ORAL | Status: AC
  Administered 2016-07-11: 40 meq via ORAL
  Filled 2016-07-11: qty 30

## 2016-07-11 NOTE — Progress Notes (Signed)
RN applied one patch to pt's Right knee. Pt denies back pain. Pt family refused second lidocaine patch which could have been applied to back.Only one lidocaine patch could not be returned to the Pyxis. Second lidocaine patch wasted and placed in Pyxis room B credit bin per pharmacy.

## 2016-07-11 NOTE — Progress Notes (Signed)
Daily Progress Note   Patient Name: Whitney Marquez       Date: 07/11/2016 DOB: Apr 08, 1923  Age: 82 y.o. MRN#: [id number] Attending Physician: Doreatha Lew, MD Primary Care Physician: Mathews Argyle, MD Admit Date: 07/07/2016  Reason for Consultation/Follow-up: Establishing goals of care  Subjective: Met with 3 daughters at bedside.  We discussed the results of the MBSS (Dysphagia 1, nectar thick).  We then completed the MOST form. (DNR, full scope treatment, No PEG tube or artificial feeding).  Dtrs very appreciative of care their mother has received.  Patient awakens.  Very sleepy but appropriate.  Length of Stay: 3  Current Medications: Scheduled Meds:  . aspirin EC  81 mg Oral Q breakfast  . calcium-vitamin D  1 tablet Oral TID WC  . chlorhexidine  15 mL Mouth Rinse BID  . mouth rinse  15 mL Mouth Rinse q12n4p  . pantoprazole  40 mg Oral Daily  . polyvinyl alcohol  2 drop Both Eyes TID  . sodium chloride flush  3 mL Intravenous Q12H    Continuous Infusions: . dextrose 50 mL/hr at 07/10/16 0921    PRN Meds: acetaminophen, lidocaine, polyvinyl alcohol, RESOURCE THICKENUP CLEAR  Physical Exam       Elderly frail female, sleeping, NAD CV RRR Resp:  NAD,  Oxygen N/C in place Abdomen:  Soft NT Extremities:  No edema, able to move all 4     Vital Signs: BP 132/67 (BP Location: Right Arm)   Pulse 74   Temp 97.9 F (36.6 C) (Oral)   Resp 18   SpO2 96%  SpO2: SpO2: 96 % O2 Device: O2 Device: Nasal Cannula O2 Flow Rate: O2 Flow Rate (L/min): 2 L/min  Intake/output summary:  Intake/Output Summary (Last 24 hours) at 07/11/16 1320 Last data filed at 07/11/16 1138  Gross per 24 hour  Intake           289.17 ml  Output              980 ml  Net           -690.83 ml   LBM: Last BM Date: 07/10/16 Baseline Weight:   Most recent weight:         Palliative Assessment/Data:    Flowsheet Rows   Flowsheet Row Most Recent Value  Intake Tab  Referral Department  Hospitalist  Unit at Time of Referral  Med/Surg Unit  Palliative Care Primary Diagnosis  Other (Comment)  Date Notified  07/08/16  Palliative Care Type  New Palliative care  Reason for referral  Clarify Goals of Care  Date of Admission  07/07/16  Date first seen by Palliative Care  07/09/16  # of days Palliative referral response time  1 Day(s)  # of days IP prior to Palliative referral  1  Clinical Assessment  Palliative Performance Scale Score  30%  Pain Max last 24 hours  Not able to report  Pain Min Last 24 hours  Not able to report  Dyspnea Max Last 24 Hours  Not able to report  Dyspnea Min Last 24 hours  Not able to report  Nausea Max Last 24 Hours  Not able to report  Nausea Min Last 24 Hours  Not able to report  Anxiety Max Last 24 Hours  Not able to report  Anxiety Min Last 24 Hours  Not able to report  Other Max Last 24 Hours  Not able to report  Psychosocial & Spiritual Assessment  Palliative Care Outcomes  Patient/Family meeting held?  Yes  Who was at the meeting?  Harlon Ditty  Palliative Care Outcomes  Clarified goals of care  Patient/Family wishes: Interventions discontinued/not started   PEG, Tube feedings/TPN  Palliative Care follow-up planned  Yes, Facility      Patient Active Problem List   Diagnosis Date Noted  . Goals of care, counseling/discussion   . Palliative care encounter   . Altered mental status 07/07/2016  . Leukocytosis 07/07/2016  . Subdural hygroma 07/07/2016  . Elevated troponin 07/07/2016  . Hypernatremia 07/07/2016  . Cerebrovascular accident (CVA) ([place])   . Delirium 07/03/2016  . Acute metabolic encephalopathy 72/06/4708  . Dehydration 07/03/2016  . History of recent fall 07/03/2016  . Right hip pain 07/03/2016    . Positive D-dimer 07/03/2016  . Aortic stenosis 07/03/2016  . Difficulty walking   . Muscle weakness (generalized)   . Confusion 06/24/2016  . CVA (cerebral infarction) 06/24/2016  . Lower urinary tract infectious disease 06/24/2016  . SIRS (systemic inflammatory response syndrome) ([place]) 10/20/2014  . Acute ischemic colitis ([place]) 10/19/2014  . Compression fracture of L1 lumbar vertebra with routine healing 10/19/2014  . Hypokalemia   . Anemia 10/15/2014  . Essential hypertension 10/15/2014  . Hyperlipidemia 10/15/2014    Palliative Care Assessment & Plan   Patient Profile: 82 y.o.femalewith past medical history of Aortic valve stenosis, arthritis, breast and colon cancer, cataracts, hypertension, plantar keratosis, scoliosis,admitted on 10/6/2017with altered mental status from [facility]. This is patient's third hospitalization since September 23 at which time she was admitted for altered mental status, slurred speech. Her workup was negative for any acute processes such as a stroke. She was found to have a urinary tract infection and was discharged on 06/26/2016. On 06/30/2016 she presented to the emergency room after a fall and was also found to have another UTI. She was readmitted from 07/03/2016 to 07/06/2016 with again slurred speech dehydration. Workup was negative again for a CVA. She was brought in again on 07/07/2016 with altered mental status and dehydration. Per family [facility] was requesting the patient be admitted to have a PICC line placed because they were unable to start a peripheral IV to hydrate her. Family shares at that time they felt like she was "transitioning towards end-of-life"; she has not been eating drinking and  had been minimally responsive. CT scan of the head performed on 07/07/2016 shows global atrophy as well as a significant increase in bilateral CSF fluid collection consistent with hygroma; as compared to scan performed on  06/30/2016  Assessment: Patient improving from acute fall and altered mental status.  Daughters now understand patient has non-reversible progressive dementia and are making appropriate plans to care for their mother.  Recommendations/Plan:  D/C to [facility] for PT / Speech / OT rehab.  Palliative Care to follow at SNF.- Please put those words in the D/C summary instructions.  DNR, full scope treatment, no feeding tube.  Family leaning towards conservative measures.  Armandina Gemma DNR form completed as well.  Goals of Care and Additional Recommendations:  Limitations on Scope of Treatment: Minimize Medications, Full Scope Treatment and No Artificial Feeding  Code Status:  DNR  Prognosis:   < 6 months given rapid decline, progressive dementia  Discharge Planning:  [place] for rehab with Palliative care service follow-up  Care plan was discussed with Dr. Quincy Simmonds and patient's 3 dtrs.  Thank you for allowing the Palliative Medicine Team to assist in the care of this patient.   Time In: 8:00 Time Out: 8:35 Total Time 35 min Prolonged Time Billed no      Greater than 50%  of this time was spent counseling and coordinating care related to the above assessment and plan.  Imogene Burn, PA-C Palliative Medicine   Please contact Palliative MedicineTeam phone at [phone] for questions and concerns.   Please see AMION for individual provider pager numbers.

## 2016-07-11 NOTE — NC FL2 (Signed)
[place] MEDICAID FL2 LEVEL OF CARE SCREENING TOOL     IDENTIFICATION  Patient Name: Whitney Marquez Birthdate: 26-Jul-1923 Sex: female Admission Date (Current Location): 07/07/2016  [facility] and [place] Number:  Herbalist and Address:  [facility], [place] [address], [place], [place] [address]      Provider Number: [id number]  Attending Physician Name and Address:  Doreatha Lew, MD  Relative Name and Phone Number:       Current Level of Care: Hospital Recommended Level of Care: [place] Prior Approval Number:    Date Approved/Denied:   PASRR Number: [id number]  Discharge Plan: SNF    Current Diagnoses: Patient Active Problem List   Diagnosis Date Noted  . Goals of care, counseling/discussion   . Palliative care encounter   . Altered mental status 07/07/2016  . Leukocytosis 07/07/2016  . Subdural hygroma 07/07/2016  . Elevated troponin 07/07/2016  . Hypernatremia 07/07/2016  . Cerebrovascular accident (CVA) ([place])   . Delirium 07/03/2016  . Acute metabolic encephalopathy [id number]  . Dehydration 07/03/2016  . History of recent fall 07/03/2016  . Right hip pain 07/03/2016  . Positive D-dimer 07/03/2016  . Aortic stenosis 07/03/2016  . Difficulty walking   . Muscle weakness (generalized)   . Confusion 06/24/2016  . CVA (cerebral infarction) 06/24/2016  . Lower urinary tract infectious disease 06/24/2016  . SIRS (systemic inflammatory response syndrome) ([place]) 10/20/2014  . Acute ischemic colitis ([place]) 10/19/2014  . Compression fracture of L1 lumbar vertebra with routine healing 10/19/2014  . Hypokalemia   . Anemia 10/15/2014  . Essential hypertension 10/15/2014  . Hyperlipidemia 10/15/2014    Orientation RESPIRATION BLADDER Height & Weight     Self  O2 (2L [place]) Incontinent Weight:   Height:     BEHAVIORAL SYMPTOMS/MOOD NEUROLOGICAL BOWEL NUTRITION STATUS      Incontinent Diet (see DC summary)   AMBULATORY STATUS COMMUNICATION OF NEEDS Skin   Extensive Assist Verbally Normal                       Personal Care Assistance Level of Assistance  Bathing, Dressing Bathing Assistance: Maximum assistance   Dressing Assistance: Maximum assistance     Functional Limitations Info             SPECIAL CARE FACTORS FREQUENCY                       Contractures      Additional Factors Info  Code Status, Allergies Code Status Info: DNR Allergies Info: Cephalosporins, Ciprofloxacin, Lisinopril, Other, Pyridium Phenazopyridine Hcl, Aleve Naproxen, Iodine, Macrobid Nitrofurantoin Monohyd Macro, Penicillins, Shellfish Allergy           Current Medications (07/11/2016):  This is the current hospital active medication list Current Facility-Administered Medications  Medication Dose Route Frequency Provider Last Rate Last Dose  . acetaminophen (TYLENOL) tablet 650 mg  650 mg Oral Q6H PRN Reubin Milan, MD      . aspirin EC tablet 81 mg  81 mg Oral Q breakfast Reubin Milan, MD   81 mg at 07/11/16 0758  . calcium-vitamin D (OSCAL WITH D) 500-200 MG-UNIT per tablet 1 tablet  1 tablet Oral TID WC Skeet Simmer, RPH   1 tablet at 07/11/16 0758  . chlorhexidine (PERIDEX) 0.12 % solution 15 mL  15 mL Mouth Rinse BID Reubin Milan, MD   15 mL at 07/11/16 0802  .  dextrose 5 % solution   Intravenous Continuous Doreatha Lew, MD 50 mL/hr at 07/10/16 [phone]    . lidocaine (LIDODERM) 5 % 2 patch  2 patch Transdermal Daily PRN Reubin Milan, MD   2 patch at 07/11/16 [id number]  . MEDLINE mouth rinse  15 mL Mouth Rinse q12n4p Reubin Milan, MD   15 mL at 07/10/16 1818  . pantoprazole (PROTONIX) EC tablet 40 mg  40 mg Oral Daily Reubin Milan, MD   40 mg at 07/11/16 0804  . polyvinyl alcohol (LIQUIFILM TEARS) 1.4 % ophthalmic solution 2 drop  2 drop Both Eyes TID Reubin Milan, MD   2 drop at 07/11/16 0804  . polyvinyl alcohol (LIQUIFILM TEARS) 1.4 %  ophthalmic solution 2 drop  2 drop Both Eyes PRN Reubin Milan, MD      . RESOURCE [facility] CLEAR   Oral PRN Doreatha Lew, MD      . sodium chloride flush (NS) 0.9 % injection 3 mL  3 mL Intravenous Q12H Reubin Milan, MD   3 mL at 07/10/16 2123     Discharge Medications: Please see discharge summary for a list of discharge medications.  Relevant Imaging Results:  Relevant Lab Results:   Additional Information SS# [id number]  Jorge Ny, LCSW

## 2016-07-11 NOTE — Progress Notes (Signed)
Initial Nutrition Assessment  INTERVENTION:  Magic cup TID with meals, each supplement provides 290 kcal and 9 grams of protein Provide Ensure Enlive po BID, each supplement provides 350 kcal and 20 grams of protein   NUTRITION DIAGNOSIS:   Predicted suboptimal nutrient intake related to lethargy/confusion (and advanced age) as evidenced by patient/family report.   GOAL:   Patient will meet greater than or equal to 90% of their needs   MONITOR:   PO intake, Supplement acceptance, Labs, Skin, I & O's  REASON FOR ASSESSMENT:   Consult Poor PO  ASSESSMENT:    82 y.o. female with medical history significant of aortic stenosis, osteoarthritis, history of breast cancer, history of colon cancer, cataracts, hypertension, scoliosis who was discharged from the hospital yesterday due to acute metabolic encephalopathy and now is being brought back to the emergency department via EMS from [facility] for evaluation of AMS.  Pt's daughters at bedside report that patient started eating better last night-100% of mashed potatoes and pudding. They report that patient likes the Magic Cup ice cream and would probably drink an Ensure. They think she has lost weight but are unsure how much; they report that the last weight they remember was 117 lbs.   Labs: low potassium  Diet Order:  DIET - DYS 1 Room service appropriate? Yes; Fluid consistency: Nectar Thick  Skin:  Reviewed, no issues  Last BM:  10/9  Height:   Ht Readings from Last 1 Encounters:  07/04/16 5\' 3"  (1.6 m)    Weight:   Wt Readings from Last 1 Encounters:  07/04/16 123 lb (55.8 kg)    Ideal Body Weight:  52.3 kg  BMI:  There is no height or weight on file to calculate BMI.  Estimated Nutritional Needs:   Kcal:  1300-1500  Protein:  65-75 grams  Fluid:  1.3-1.5 L/day  EDUCATION NEEDS:   No education needs identified at this time  [place], CSP, LDN Inpatient Clinical Dietitian Pager:  [phone] After Hours Pager: [phone]

## 2016-07-11 NOTE — Progress Notes (Signed)
Whitney Marquez Setting to be D/C'd to [facility] living SNF per MD order.  Discussed with the patient and all questions fully answered.  VSS, Skin clean, dry and intact without evidence of skin break down, no evidence of skin tears noted. IV catheter discontinued intact. Site without signs and symptoms of complications. Dressing and pressure applied.  D/c education completed with patient/family including follow up instructions, medication list, d/c activities limitations if indicated, with other d/c instructions as indicated by MD - patient able to verbalize understanding, all questions fully answered.   Patient instructed to return to ED, call 911, or call MD for any changes in condition.   Patient escorted via stretcher, and D/C to SNF via private auto.  Morley Kos Price 07/11/2016 3:40 PM

## 2016-07-11 NOTE — Progress Notes (Signed)
Patient will discharge to [facility] SNF Anticipated discharge date: 10/10 Family notified: at bedside Transportation by PTAR- called at 3:05pm  CSW signing off.  Jorge Ny, [place] Social Worker [phone]

## 2016-07-11 NOTE — Progress Notes (Signed)
Speech Language Pathology Treatment: Dysphagia  Patient Details Name: Whitney Marquez MRN: [id number] DOB: 1923/02/27 Today's Date: 07/11/2016 Time: [id number] SLP Time Calculation (min) (ACUTE ONLY): 25 min  Assessment / Plan / Recommendation Clinical Impression  SLP provided education to family regarding thickening, feeding strategies, risk of aspiration. Demonstrated techniques. Pt lethargic this am but able to take 3 sips of thickened water with a straw and a chin tuck with max cues. Continue current diet. Will f/u.    HPI HPI: Whitney B Holmesis a 82 y.o.woman with a history of aortic stenosis, HTN, HLD, and breast and colon cancers who up until recently, was a resident in an independent living facility. She was admitted to the hospital 9/23-9/25 after  with a UTI. She was discharged on full strength Bactrim. She was back in the ED on 9/29 after having a fall that resulted in a laceration over her right eye and extensive bruising involving most of her right face and neck. She was diagnosed with UTI again and was discharged to home with antibiotics. She was re-admitted 10/2 with acute metabolic encephalopathy due to polypharmacy, recent UTI. Pt seen by SLP, determined to have significant dysphagia, but family wished to d/c quite early to return to familiar environment. Readmitted 10/6 for AMS and dehydration. Palliative care consulted to aid in discussion of end of life given progressive decline.       SLP Plan  Continue with current plan of care     Recommendations  Diet recommendations: Dysphagia 1 (puree);Nectar-thick liquid Liquids provided via: Straw;Cup Supervision: Full supervision/cueing for compensatory strategies;Trained caregiver to feed patient                Oral Care Recommendations: Oral care QID Follow up Recommendations: Skilled Nursing facility Plan: Continue with current plan of care       GO               [facility], MA CCC-SLP  [id number]  Whitney Marquez 07/11/2016, 9:59 AM

## 2016-07-11 NOTE — Evaluation (Signed)
Physical Therapy Evaluation Patient Details Name: Whitney Marquez MRN: [id number] DOB: Apr 23, 1923 Today's Date: 07/11/2016   History of Present Illness  Whitney Marquez is a 82 y.o. female with medical history significant of aortic stenosis, osteoarthritis, history of breast cancer, history of colon cancer, cataracts, hypertension, scoliosis who was discharged from the hospital yesterday due to acute metabolic encephalopathy and now is being brought back to the emergency department via EMS from [facility] for evaluation of AMS.  Clinical Impression  Pt admitted with above. Pt slow to respond and requires maximal assist for all mobility and ADLs. Pt to benefit from SNF upon d/c to address deficits and work towards independence to transition to ALF safely.    Follow Up Recommendations SNF;Supervision/Assistance - 24 hour    Equipment Recommendations  None recommended by PT    Recommendations for Other Services       Precautions / Restrictions Precautions Precautions: Fall Precaution Comments: fall, bruising on R side of face Restrictions Weight Bearing Restrictions: No      Mobility  Bed Mobility Overal bed mobility: Needs Assistance Bed Mobility: Rolling;Sidelying to Sit Rolling: Max assist Sidelying to sit: Max assist       General bed mobility comments: max directional and tactile cues to sequence task, assist for trunk elevation  Transfers Overall transfer level: Needs assistance Equipment used: Rolling walker (2 wheeled) Transfers: Sit to/from Stand Sit to Stand: Max assist         General transfer comment: used gait belt with L knee blocked, pt with minimal effort to assist in task, unable to to achieve full upright posture.. completed std pvt to [facility] however pvted on balls of feet. no stepping. pt then stood again to get off commode,   Ambulation/Gait             General Gait Details: unable  Stairs            Wheelchair Mobility     Modified Rankin (Stroke Patients Only)       Balance Overall balance assessment: Needs assistance Sitting-balance support: Feet supported;Bilateral upper extremity supported Sitting balance-Leahy Scale: Poor Sitting balance - Comments: pt unable to maintian upright posture, max directional verbal and tactile cues to maintain midline, otherwise would lean posteriorly   Standing balance support: Bilateral upper extremity supported Standing balance-Leahy Scale: Zero Standing balance comment: pt dependent on PT to maintain upright                             Pertinent Vitals/Pain Pain Assessment: No/denies pain    Home Living Family/patient expects to be discharged to:: Skilled nursing facility                 Additional Comments: pt was at [facility]    Prior Function Level of Independence: Needs assistance   Gait / Transfers Assistance Needed: was using cane/walker until 9/29, now has been mostly in bed since fall 9/29  ADL's / Homemaking Assistance Needed: assist from staff        Hand Dominance   Dominant Hand: Right    Extremity/Trunk Assessment   Upper Extremity Assessment: Generalized weakness           Lower Extremity Assessment: Generalized weakness      Cervical / Trunk Assessment: Kyphotic  Communication   Communication:  (delayed response and soft spoken)  Cognition Arousal/Alertness: Awake/alert Behavior During Therapy: Flat affect Overall Cognitive Status: Impaired/Different from baseline  Area of Impairment: Orientation;Attention;Memory;Following commands;Safety/judgement;Awareness;Problem solving Orientation Level: Disoriented to;Place;Situation;Time Current Attention Level: Sustained Memory: Decreased short-term memory Following Commands: Follows one step commands with increased time;Follows one step commands inconsistently Safety/Judgement: Decreased awareness of safety;Decreased awareness of deficits Awareness:  Intellectual Problem Solving: Slow processing;Decreased initiation;Difficulty sequencing;Requires verbal cues;Requires tactile cues      General Comments General comments (skin integrity, edema, etc.): pt did attempt to perform hygiene s/p urination but required assist    Exercises     Assessment/Plan    PT Assessment Patient needs continued PT services  PT Problem List Decreased strength;Decreased activity tolerance;Decreased balance;Decreased mobility;Decreased cognition;Decreased safety awareness          PT Treatment Interventions Gait training;Functional mobility training;Therapeutic activities;Therapeutic exercise;Balance training;Patient/family education;DME instruction;Cognitive remediation    PT Goals (Current goals can be found in the Care Plan section)  Acute Rehab PT Goals Patient Stated Goal: Return to [place], SNF side PT Goal Formulation: With family Time For Goal Achievement: 07/18/16 Potential to Achieve Goals: Fair    Frequency Min 3X/week   Barriers to discharge Decreased caregiver support      Co-evaluation               End of Session Equipment Utilized During Treatment: Gait belt Activity Tolerance: Patient tolerated treatment well Patient left: in chair;with call bell/phone within reach;with chair alarm set;with family/visitor present Nurse Communication: Mobility status         Time: [id number] PT Time Calculation (min) (ACUTE ONLY): 24 min   Charges:   PT Evaluation $PT Eval Moderate Complexity: 1 Procedure PT Treatments $Therapeutic Activity: 8-22 mins   PT G CodesKingsley Callander 07/11/2016, 12:21 PM   Kittie Plater, PT, DPT Pager #: [phone] Office #: [phone]

## 2016-07-11 NOTE — Discharge Summary (Addendum)
Physician Discharge Summary  Whitney Marquez  [id number]  DOB: Feb 14, 1923  DOA: 07/07/2016 PCP: Mathews Argyle, MD  Admit date: 07/07/2016 Discharge date: 07/11/2016  Admitted From: Home  Disposition: SNF  Recommendations for Outpatient Follow-up:  1. Follow up with PCP in 1 week 2. Please obtain BMP/CBC in one week 3. Work towards independence to transition to ALF safely. 4. Palliative Care Follow Up @ SNF 5. PT/OT and Speech rehab.   Home Health: None  Equipment/Devices: None  Discharge Condition:  CODE STATUS: DNR Diet recommendation: Heart Healthy / Dysphagia 1 Nectar-thick liquids  Liquids provided via: Straw;Cup Supervision: Full supervision/cueing for compensatory strategies;Trained caregiver to feed patient   Brief/Interim Summary: Ardene B Holmesis an 82 y.o.woman with a history of aortic stenosis, HTN, HLD, and breast and colon cancers who up until recently, was a resident in an independent living facility. She was admitted to the hospital 9/23-9/25 after  with a UTI. She was discharged on full strength Bactrim. She was back in the ED on 9/29 after having a fall that resulted in a laceration over her right eye and extensive bruising involving most of her right face and neck. She was diagnosed with UTI again and was discharged to home with antibiotics. Patient re-admitted again on  07/08/2016 with acute metabolic encephalopathy, likely due to hypernatremia and dehydration. Patient was started on IVF and subsequently improved, patient developed urinary retention was cath multiple times with no clear cause, although it resolved on it own after patient was more active.   Subjective:  Patient seen and examined with daughter at bedside. Significant improvement. Having micturition on her own.   Discharge Diagnoses:    Altered mental status 2/2 HyperNa for dehydration (Resolved) - Bact to baseline, ? How much this is part dementia.  Encourage oral hydration aand  PO intake as tolerated Palliative care to follow up in SNF   Hypernatraemia - Resolved  Monitor BMP in 1 week   Subdural hygroma - stable  Not a good surgical candidate.  Anemia - stable  Monitor H&H. Consider start iron supplements   Essential hypertension, - stable  ECHO 07/04/16 EF 65% - 70%  Not on medication - BP holding well  Monitor blood pressure.  Leukocytosis - reactive due to dehydration - no sources of clear infection, CXR negative, UA negative, afebrile CBC in 1 week   Urinary retention - Resolved  Bladder scan if no urination in 12 hrs Avoid anticholinergic medications  Dementia - stable  Monitor   Discharge Instructions  Discharge Instructions    Call MD for:  difficulty breathing, headache or visual disturbances    Complete by:  As directed    Call MD for:  extreme fatigue    Complete by:  As directed    Call MD for:  hives    Complete by:  As directed    Call MD for:  persistant dizziness or light-headedness    Complete by:  As directed    Call MD for:  persistant nausea and vomiting    Complete by:  As directed    Call MD for:  redness, tenderness, or signs of infection (pain, swelling, redness, odor or green/yellow discharge around incision site)    Complete by:  As directed    Call MD for:  severe uncontrolled pain    Complete by:  As directed    Call MD for:  temperature >100.4    Complete by:  As directed    Diet - low sodium heart  healthy    Complete by:  As directed    Heart Healthy / Dysphagia 1 Nectar-thick liquids Liquids provided via: Straw;Cup Supervision: Full supervision/cueing for compensatory strategies;Trained caregiver to feed patient   Discharge instructions    Complete by:  As directed    Discharge instructions    Complete by:  As directed    Check BMP in 1 week Avoid sedative medications Avoid anticholinergics (e.g benadryl)   Increase activity slowly    Complete by:  As directed        Medication List    TAKE  these medications   acetaminophen 325 MG tablet Commonly known as:  TYLENOL Take 2 tablets (650 mg total) by mouth every 6 (six) hours as needed for mild pain (or Fever >/= 101).   ARTIFICIAL TEARS 0.4 % Soln Generic drug:  Hypromellose Apply 2 drops to eye 3 (three) times daily.   aspirin 81 MG tablet Take 81 mg by mouth daily with breakfast.   BIOTENE DRY MOUTH MOISTURIZING Soln See admin instructions. 1-2 sprays into the mouth three times a day   Calcium-Vitamin D-Vitamin K 500-100-40 MG-UNT-MCG Chew Chew 1 tablet by mouth 3 (three) times daily with meals.   lidocaine 5 % Commonly known as:  LIDODERM Place 2 patches onto the skin daily. Remove & Discard patch within 12 hours or as directed by MD (knee and back as needed for pain) What changed:  how much to take  additional instructions   omeprazole 20 MG capsule Commonly known as:  PRILOSEC Take 20 mg by mouth daily.   REFRESH OP Apply 2 drops to eye at bedtime as needed (for dry eyes).   RESOURCE THICKENUP CLEAR Powd Pudding thick liquids      Follow-up Information    Mathews Argyle, MD.   Specialty:  Internal Medicine Contact information: [address] [place] [address] [phone]          Allergies  Allergen Reactions  . Cephalosporins Anaphylaxis  . Ciprofloxacin Other (See Comments)    Son reports seizure-like activity  . Lisinopril Other (See Comments)    UNKNOWN REACTION on MAR  . Other Swelling    Walnuts causes lips swelling and ALL NUTS, per [facility] ANESTHESIA (as noted on MAR)  . Pyridium [Phenazopyridine Hcl] Other (See Comments)    UNKNOWN  . Aleve [Naproxen] Rash  . Iodine Itching and Rash  . Macrobid [Nitrofurantoin Monohyd Macro] Swelling and Rash  . Penicillins Hives and Rash    Has patient had a PCN reaction causing immediate rash, facial/tongue/throat swelling, SOB or lightheadedness with hypotension: Yes Has patient had a PCN reaction causing severe  rash involving mucus membranes or skin necrosis: Unknown Has patient had a PCN reaction that required hospitalization: Unknown Has patient had a PCN reaction occurring within the last 10 years: No If all of the above answers are "NO", then may proceed with Cephalosporin use.   . Shellfish Allergy Itching and Rash    Consultations:  Palliative Care   Procedures/Studies: Dg Chest 2 View  Result Date: 07/02/2016 CLINICAL DATA:  Fall 2 days ago with chest pain, initial encounter EXAM: CHEST  2 VIEW COMPARISON:  05/17/2016 FINDINGS: Cardiac shadow is stable. Aortic calcifications are again seen. Elevation the right hemidiaphragm is noted. Some mild interstitial changes are again seen and stable. No pneumothorax or focal infiltrate is seen. Bony structures show no acute abnormality. IMPRESSION: No active cardiopulmonary disease. Electronically Signed   By: Linus Mako.D.  On: 07/02/2016 19:57   Ct Head Wo Contrast  Result Date: 07/07/2016 CLINICAL DATA:  Altered mental status. Patient unresponsive. Hypoxia. Recent fall. EXAM: CT HEAD WITHOUT CONTRAST TECHNIQUE: Contiguous axial images were obtained from the base of the skull through the vertex without intravenous contrast. COMPARISON:  CT head 07/02/2016.  Initial CT head 06/30/2016. FINDINGS: Brain: Global atrophy. Small vessel disease. No hydrocephalus or intra-axial mass lesion. No features are seen to confirm cerebral infarction or anoxic injury. Increasing BILATERAL extra-axial CSF like fluid collections consistent with hygromas. 13 mm thick on the RIGHT and 12 mm thick on the LEFT, significant progression from most recent study 5 days ago. Sulcal flattening is present, without herniation. Vascular: No hyperdense vessel or unexpected calcification. Skull: Normal. Negative for fracture or focal lesion. RIGHT frontal scalp hematoma redemonstrated. Sinuses/Orbits: No acute finding. Other: None. IMPRESSION: Significant increase in BILATERAL  subdural hygromas since 06/30/16 and 07/02/2016. LEFT untreated, increased intracranial pressure or herniation could occur. Depending on the patient's clinical condition, neurosurgical consultation may be warranted. These results were called by telephone at the time of interpretation on 07/07/2016 at 5:29 pm to Dr. Jola Schmidt , who verbally acknowledged these results. Electronically Signed   By: Staci Righter M.D.   On: 07/07/2016 17:31   Ct Head Wo Contrast  Result Date: 07/02/2016 CLINICAL DATA:  Right-sided bruising after falling 2 days ago. Slurred speech. Generalized weakness. EXAM: CT HEAD WITHOUT CONTRAST TECHNIQUE: Contiguous axial images were obtained from the base of the skull through the vertex without intravenous contrast. COMPARISON:  Head CT 06/30/2016 and 06/24/2016. MRI brain 06/25/2016. FINDINGS: Despite efforts by the technologist and patient, motion artifact is present on today's exam and could not be eliminated. This reduces exam sensitivity and specificity. Brain: There is no evidence of acute intracranial hemorrhage, mass lesion, brain edema or new extra-axial fluid collection. There is stable diffuse prominence of the subarachnoid spaces, especially over the frontal lobes. No hydrocephalus. There is no CT evidence of acute cortical infarction. Stable chronic small vessel ischemic changes in the periventricular white matter. Vascular: Extensive intracranial vascular calcifications are again noted. Skull: Negative for fracture or focal lesion. Sinuses/Orbits: Periorbital swelling on the right appears improved. The visualized paranasal sinuses, mastoid air cells and middle ears are clear. Other: None. IMPRESSION: 1. Interval improvement in periorbital swelling/hematoma on the right. 2. No acute intracranial findings. 3. Stable atrophy and chronic small vessel ischemic changes. Electronically Signed   By: Richardean Sale M.D.   On: 07/02/2016 20:29   Ct Head Wo Contrast  Result Date:  06/30/2016 CLINICAL DATA:  Status post fall today while walking. Hematoma above the right eye. Initial encounter. EXAM: CT HEAD WITHOUT CONTRAST CT MAXILLOFACIAL WITHOUT CONTRAST CT CERVICAL SPINE WITHOUT CONTRAST TECHNIQUE: Multidetector CT imaging of the head, cervical spine, and maxillofacial structures were performed using the standard protocol without intravenous contrast. Multiplanar CT image reconstructions of the cervical spine and maxillofacial structures were also generated. COMPARISON:  CT head CT scan 06/24/2016.  Brain MRI 06/25/2016. FINDINGS: CT HEAD FINDINGS Brain: There is cortical atrophy and chronic microvascular ischemic change. No evidence of acute intracranial abnormality including hemorrhage, infarct, mass lesion, mass effect, midline shift or abnormal extra-axial fluid collection. No hydrocephalus or pneumocephalus. Vascular: Extensive atherosclerosis is seen. Skull: Intact. Sinuses/Orbits: See below. Other: None. CT MAXILLOFACIAL FINDINGS Osseous: No fracture. Mandibular condyles are located with marked degenerative change seen bilaterally. Orbits: Unremarkable. Sinuses: Clear. Soft tissues: Hematoma is seen about the right eye. Limited intracranial: See above. CT  CERVICAL SPINE FINDINGS Alignment: 0.3 cm anterolisthesis C3 on C4 and C4 on C5 due to facet arthropathy is identified. There is also trace anterolisthesis C7 on T1. Skull base and vertebrae: No fracture Soft tissues and spinal canal: No epidural hematoma is identified. Carotid atherosclerosis is noted. Disc levels: There is marked loss of disc space height at C5-6 and C6-7. Milder degree of degenerative disc disease is noted at C4-5. Upper chest: Single small calcified pleural plaque in the right apex is noted. Otherwise unremarkable. Other: None. IMPRESSION: Contusion about the right eye. No other acute abnormality head, face or cervical spine. Extensive atrophy and chronic microvascular ischemic change. Multilevel cervical  spondylosis. Atherosclerosis. Electronically Signed   By: Inge Rise M.D.   On: 06/30/2016 16:08   Ct Head Wo Contrast  Result Date: 06/24/2016 CLINICAL DATA:  Episode of confusion this morning. The symptoms have improved. No headache. EXAM: CT HEAD WITHOUT CONTRAST TECHNIQUE: Contiguous axial images were obtained from the base of the skull through the vertex without intravenous contrast. COMPARISON:  MRI August 19, 2008 FINDINGS: Brain: No subdural, epidural, or subarachnoid hemorrhage. Cerebellum, brainstem, and basal cisterns are normal. Ventricles and sulci are unremarkable for age. No mass, mass effect, or midline shift. Mild white matter changes. No acute cortical ischemia or infarct. Vascular: Calcified atherosclerosis is seen in the intracranial carotid arteries. Skull: Normal. Negative for fracture or focal lesion. Sinuses/Orbits: No acute finding. Other: Anterior listhesis of C4 versus C5 on the scout view is unchanged when compared to the cervical spine x-ray from September 2014. IMPRESSION: No acute intracranial process. Electronically Signed   By: Dorise Bullion III M.D   On: 06/24/2016 17:25   Ct Cervical Spine Wo Contrast  Result Date: 06/30/2016 CLINICAL DATA:  Status post fall today while walking. Hematoma above the right eye. Initial encounter. EXAM: CT HEAD WITHOUT CONTRAST CT MAXILLOFACIAL WITHOUT CONTRAST CT CERVICAL SPINE WITHOUT CONTRAST TECHNIQUE: Multidetector CT imaging of the head, cervical spine, and maxillofacial structures were performed using the standard protocol without intravenous contrast. Multiplanar CT image reconstructions of the cervical spine and maxillofacial structures were also generated. COMPARISON:  CT head CT scan 06/24/2016.  Brain MRI 06/25/2016. FINDINGS: CT HEAD FINDINGS Brain: There is cortical atrophy and chronic microvascular ischemic change. No evidence of acute intracranial abnormality including hemorrhage, infarct, mass lesion, mass effect,  midline shift or abnormal extra-axial fluid collection. No hydrocephalus or pneumocephalus. Vascular: Extensive atherosclerosis is seen. Skull: Intact. Sinuses/Orbits: See below. Other: None. CT MAXILLOFACIAL FINDINGS Osseous: No fracture. Mandibular condyles are located with marked degenerative change seen bilaterally. Orbits: Unremarkable. Sinuses: Clear. Soft tissues: Hematoma is seen about the right eye. Limited intracranial: See above. CT CERVICAL SPINE FINDINGS Alignment: 0.3 cm anterolisthesis C3 on C4 and C4 on C5 due to facet arthropathy is identified. There is also trace anterolisthesis C7 on T1. Skull base and vertebrae: No fracture Soft tissues and spinal canal: No epidural hematoma is identified. Carotid atherosclerosis is noted. Disc levels: There is marked loss of disc space height at C5-6 and C6-7. Milder degree of degenerative disc disease is noted at C4-5. Upper chest: Single small calcified pleural plaque in the right apex is noted. Otherwise unremarkable. Other: None. IMPRESSION: Contusion about the right eye. No other acute abnormality head, face or cervical spine. Extensive atrophy and chronic microvascular ischemic change. Multilevel cervical spondylosis. Atherosclerosis. Electronically Signed   By: Inge Rise M.D.   On: 06/30/2016 16:08   Mr Jodene Nam Head Wo Contrast  Result Date: 06/25/2016 CLINICAL  DATA:  Acute onset confusion and left hemianopia. EXAM: MRI HEAD WITHOUT CONTRAST MRA HEAD WITHOUT CONTRAST MRA NECK WITHOUT AND WITH CONTRAST TECHNIQUE: Multiplanar, multiecho pulse sequences of the brain and surrounding structures were obtained without intravenous contrast. Angiographic images of the Circle of Willis were obtained using MRA technique without intravenous contrast. Angiographic images of the neck were obtained using MRA technique without and with intravenous contrast. Carotid stenosis measurements (when applicable) are obtained utilizing NASCET criteria, using the distal  internal carotid diameter as the denominator. CONTRAST:  29mL MULTIHANCE GADOBENATE DIMEGLUMINE 529 MG/ML IV SOLN COMPARISON:  Head CT 06/24/2016 and MRI 08/19/2008 FINDINGS: MRI HEAD FINDINGS Brain: There is no evidence of acute infarct, intracranial hemorrhage, mass, midline shift, or extra-axial fluid collection. There is moderate cerebral atrophy, mildly progressed from the prior MRI. Patchy T2 hyperintensities in the subcortical and deep cerebral white matter have also progressed and are nonspecific but compatible with moderate chronic small vessel ischemic disease. There is a small chronic right which is new from the prior MRI cerebellar infarct. Chronic lacunar infarcts in the corpus callosum are unchanged. Vascular: Major intracranial vascular flow voids are preserved. Skull and upper cervical spine: No suspicious focal osseous lesion. Partially visualized cervical disc degeneration, advanced at C5-6. Grade 1 anterolisthesis of C3 on C4 and C4 on C5. Sinuses/Orbits: Prior bilateral cataract extraction. Trace left sphenoid sinus mucosal thickening. Clear mastoid air cells. Other: None. MRA HEAD FINDINGS The visualized distal vertebral arteries are widely patent with the left being slightly dominant. Left PICA origin is patent. Right PICA is not identified. AICA and SCA origins are patent. Basilar artery is widely patent. There is a patent left posterior communicating artery with mildly hypoplastic left P1 segment. No significant proximal PCA stenosis is seen. The internal carotid arteries are widely patent from skullbase to carotid termini. There is mild cavernous segment irregularity bilaterally. ACAs and MCAs are patent without evidence of major branch occlusion or significant proximal stenosis. No intracranial aneurysm is identified. MRA NECK FINDINGS Standard aortic arch configuration. Brachiocephalic and subclavian arteries appear widely patent. Eccentric plaque at the carotid bifurcation on the right  results in approximately 50% distal common carotid artery stenosis as well as moderate to severe proximal ECA stenosis. The right ICA is patent and tortuous proximally without significant stenosis. The left common carotid artery is widely patent. The left cervical ICA is patent and tortuous in its proximal to midportion with focal kinking. There is moderate to severe left ECA origin stenosis. The vertebral arteries are patent with antegrade flow bilaterally and with the left being mildly dominant. No significant vertebral artery stenosis is identified, although both V3 segments demonstrated a mildly beaded appearance. IMPRESSION: 1. No acute intracranial abnormality. 2. Moderate chronic small vessel ischemic disease with chronic lacunar infarcts as above, progressed from 2009. 3. No major intracranial arterial occlusion or significant stenosis. 4. 50% distal right common carotid artery stenosis. 5. Tortuous left cervical ICA with focal kinking. 6. Moderate to severe proximal external carotid artery stenosis bilaterally. 7. Suspected mild fibromuscular dysplasia in the vertebral arteries. Electronically Signed   By: Logan Bores M.D.   On: 06/25/2016 16:37   Mr Angiogram Neck W Wo Contrast  Result Date: 06/25/2016 CLINICAL DATA:  Acute onset confusion and left hemianopia. EXAM: MRI HEAD WITHOUT CONTRAST MRA HEAD WITHOUT CONTRAST MRA NECK WITHOUT AND WITH CONTRAST TECHNIQUE: Multiplanar, multiecho pulse sequences of the brain and surrounding structures were obtained without intravenous contrast. Angiographic images of the Circle of Willis were obtained  using MRA technique without intravenous contrast. Angiographic images of the neck were obtained using MRA technique without and with intravenous contrast. Carotid stenosis measurements (when applicable) are obtained utilizing NASCET criteria, using the distal internal carotid diameter as the denominator. CONTRAST:  24mL MULTIHANCE GADOBENATE DIMEGLUMINE 529 MG/ML IV  SOLN COMPARISON:  Head CT 06/24/2016 and MRI 08/19/2008 FINDINGS: MRI HEAD FINDINGS Brain: There is no evidence of acute infarct, intracranial hemorrhage, mass, midline shift, or extra-axial fluid collection. There is moderate cerebral atrophy, mildly progressed from the prior MRI. Patchy T2 hyperintensities in the subcortical and deep cerebral white matter have also progressed and are nonspecific but compatible with moderate chronic small vessel ischemic disease. There is a small chronic right which is new from the prior MRI cerebellar infarct. Chronic lacunar infarcts in the corpus callosum are unchanged. Vascular: Major intracranial vascular flow voids are preserved. Skull and upper cervical spine: No suspicious focal osseous lesion. Partially visualized cervical disc degeneration, advanced at C5-6. Grade 1 anterolisthesis of C3 on C4 and C4 on C5. Sinuses/Orbits: Prior bilateral cataract extraction. Trace left sphenoid sinus mucosal thickening. Clear mastoid air cells. Other: None. MRA HEAD FINDINGS The visualized distal vertebral arteries are widely patent with the left being slightly dominant. Left PICA origin is patent. Right PICA is not identified. AICA and SCA origins are patent. Basilar artery is widely patent. There is a patent left posterior communicating artery with mildly hypoplastic left P1 segment. No significant proximal PCA stenosis is seen. The internal carotid arteries are widely patent from skullbase to carotid termini. There is mild cavernous segment irregularity bilaterally. ACAs and MCAs are patent without evidence of major branch occlusion or significant proximal stenosis. No intracranial aneurysm is identified. MRA NECK FINDINGS Standard aortic arch configuration. Brachiocephalic and subclavian arteries appear widely patent. Eccentric plaque at the carotid bifurcation on the right results in approximately 50% distal common carotid artery stenosis as well as moderate to severe proximal ECA  stenosis. The right ICA is patent and tortuous proximally without significant stenosis. The left common carotid artery is widely patent. The left cervical ICA is patent and tortuous in its proximal to midportion with focal kinking. There is moderate to severe left ECA origin stenosis. The vertebral arteries are patent with antegrade flow bilaterally and with the left being mildly dominant. No significant vertebral artery stenosis is identified, although both V3 segments demonstrated a mildly beaded appearance. IMPRESSION: 1. No acute intracranial abnormality. 2. Moderate chronic small vessel ischemic disease with chronic lacunar infarcts as above, progressed from 2009. 3. No major intracranial arterial occlusion or significant stenosis. 4. 50% distal right common carotid artery stenosis. 5. Tortuous left cervical ICA with focal kinking. 6. Moderate to severe proximal external carotid artery stenosis bilaterally. 7. Suspected mild fibromuscular dysplasia in the vertebral arteries. Electronically Signed   By: Logan Bores M.D.   On: 06/25/2016 16:37   Mr Brain Wo Contrast  Result Date: 06/25/2016 CLINICAL DATA:  Acute onset confusion and left hemianopia. EXAM: MRI HEAD WITHOUT CONTRAST MRA HEAD WITHOUT CONTRAST MRA NECK WITHOUT AND WITH CONTRAST TECHNIQUE: Multiplanar, multiecho pulse sequences of the brain and surrounding structures were obtained without intravenous contrast. Angiographic images of the Circle of Willis were obtained using MRA technique without intravenous contrast. Angiographic images of the neck were obtained using MRA technique without and with intravenous contrast. Carotid stenosis measurements (when applicable) are obtained utilizing NASCET criteria, using the distal internal carotid diameter as the denominator. CONTRAST:  59mL MULTIHANCE GADOBENATE DIMEGLUMINE 529 MG/ML IV SOLN  COMPARISON:  Head CT 06/24/2016 and MRI 08/19/2008 FINDINGS: MRI HEAD FINDINGS Brain: There is no evidence of acute  infarct, intracranial hemorrhage, mass, midline shift, or extra-axial fluid collection. There is moderate cerebral atrophy, mildly progressed from the prior MRI. Patchy T2 hyperintensities in the subcortical and deep cerebral white matter have also progressed and are nonspecific but compatible with moderate chronic small vessel ischemic disease. There is a small chronic right which is new from the prior MRI cerebellar infarct. Chronic lacunar infarcts in the corpus callosum are unchanged. Vascular: Major intracranial vascular flow voids are preserved. Skull and upper cervical spine: No suspicious focal osseous lesion. Partially visualized cervical disc degeneration, advanced at C5-6. Grade 1 anterolisthesis of C3 on C4 and C4 on C5. Sinuses/Orbits: Prior bilateral cataract extraction. Trace left sphenoid sinus mucosal thickening. Clear mastoid air cells. Other: None. MRA HEAD FINDINGS The visualized distal vertebral arteries are widely patent with the left being slightly dominant. Left PICA origin is patent. Right PICA is not identified. AICA and SCA origins are patent. Basilar artery is widely patent. There is a patent left posterior communicating artery with mildly hypoplastic left P1 segment. No significant proximal PCA stenosis is seen. The internal carotid arteries are widely patent from skullbase to carotid termini. There is mild cavernous segment irregularity bilaterally. ACAs and MCAs are patent without evidence of major branch occlusion or significant proximal stenosis. No intracranial aneurysm is identified. MRA NECK FINDINGS Standard aortic arch configuration. Brachiocephalic and subclavian arteries appear widely patent. Eccentric plaque at the carotid bifurcation on the right results in approximately 50% distal common carotid artery stenosis as well as moderate to severe proximal ECA stenosis. The right ICA is patent and tortuous proximally without significant stenosis. The left common carotid artery is  widely patent. The left cervical ICA is patent and tortuous in its proximal to midportion with focal kinking. There is moderate to severe left ECA origin stenosis. The vertebral arteries are patent with antegrade flow bilaterally and with the left being mildly dominant. No significant vertebral artery stenosis is identified, although both V3 segments demonstrated a mildly beaded appearance. IMPRESSION: 1. No acute intracranial abnormality. 2. Moderate chronic small vessel ischemic disease with chronic lacunar infarcts as above, progressed from 2009. 3. No major intracranial arterial occlusion or significant stenosis. 4. 50% distal right common carotid artery stenosis. 5. Tortuous left cervical ICA with focal kinking. 6. Moderate to severe proximal external carotid artery stenosis bilaterally. 7. Suspected mild fibromuscular dysplasia in the vertebral arteries. Electronically Signed   By: Logan Bores M.D.   On: 06/25/2016 16:37   Dg Chest Port 1 View  Result Date: 07/10/2016 CLINICAL DATA:  Shortness breath. EXAM: PORTABLE CHEST 1 VIEW COMPARISON:  07/07/2016.  07/02/2016. FINDINGS: Stable appearance marked asymmetric elevation right hemidiaphragm. No evidence for airspace pulmonary edema or focal lung consolidation. Scarring or chronic atelectasis again noted left lung base. Cardiopericardial silhouette is at upper limits of normal for size. Bones are diffusely demineralized. IMPRESSION: Stable.  No new or progressive interval findings. Electronically Signed   By: Misty Stanley M.D.   On: 07/10/2016 10:16   Dg Chest Portable 1 View  Result Date: 07/07/2016 CLINICAL DATA:  Shortness of breath. EXAM: PORTABLE CHEST 1 VIEW COMPARISON:  07/02/2016 FINDINGS: Chronic elevation of the right hemidiaphragm. Heart and mediastinum are stable. Aortic arch has atherosclerotic calcifications. Surgical clips in the right axilla. Subtle interstitial densities throughout both lungs could represent mild edema versus chronic  change. Negative for a pneumothorax. IMPRESSION: Question mild  interstitial edema. Stable elevation of the right hemidiaphragm. Aortic atherosclerosis. Electronically Signed   By: Markus Daft M.D.   On: 07/07/2016 15:57   Dg Swallowing Func-speech Pathology  Result Date: 07/10/2016 Objective Swallowing Evaluation: Type of Study: MBS-Modified Barium Swallow Study Patient Details Name: DAIZIE JOURNIGAN MRN: [id number] Date of Birth: 03-17-1923 Today's Date: 07/10/2016 Time: SLP Start Time (ACUTE ONLY): 1330-SLP Stop Time (ACUTE ONLY): 1355 SLP Time Calculation (min) (ACUTE ONLY): 25 min Past Medical History: Past Medical History: Diagnosis Date . Aortic valve stenosis  . Arthritis  . Cancer [facility])   breast and colon . Cancer ([place])  . Cataract  . Cataracts, both eyes  . Hypertension  . Plantar keratosis  . Scoliosis  Past Surgical History: Past Surgical History: Procedure Laterality Date . BACK SURGERY   . BREAST SURGERY    breast cancer . COLON SURGERY    colon cancer . COLON SURGERY   . LUMBAR LAMINECTOMY   . meniscus tear   . TOE AMPUTATION    second toe HPI: Whitney B Holmesis a 82 y.o.woman with a history of aortic stenosis, HTN, HLD, and breast and colon cancers who up until recently, was a resident in an independent living facility. She was admitted to the hospital 9/23-9/25 after  with a UTI. She was discharged on full strength Bactrim. She was back in the ED on 9/29 after having a fall that resulted in a laceration over her right eye and extensive bruising involving most of her right face and neck. She was diagnosed with UTI again and was discharged to home with antibiotics. She was re-admitted 10/2 with acute metabolic encephalopathy due to polypharmacy, recent UTI. Pt seen by SLP, determined to have significant dysphagia, but family wished to d/c quite early to return to familiar environment. Readmitted 10/6 for AMS and dehydration. Palliative care consulted to aid in discussion of end of life given  progressive decline.  No Data Recorded Assessment / Plan / Recommendation CHL IP CLINICAL IMPRESSIONS 07/10/2016 Therapy Diagnosis Moderate oral phase dysphagia;Moderate pharyngeal phase dysphagia;Severe pharyngeal phase dysphagia Clinical Impression Pt demonstrates a moderate to severe dysphagia due to generalized weakness and severely dry oral and base of tongue mucosa. Lingual propulsion of boluses was initially weak with liquids falling uncontrolled to oropharynx, though this improved as session progressed. Pt eventually able to masticate and form solid bolus. Hyolaryngeal mobility and base of tongue are weak and vallecular residuals are severe, likely also due to dry mucosa.  A chin tuck with straw sips dramatically improved propulsion of bolus through the pharynx and reduced residuals and penetration with nectar thick liquids. Recommend a dys 1 (puree) diet and nectar thick liquids with full supervision for a chin tuck and double swallows, intermittent throat clearing encouraged. Aspiration risk is still high though hopeful for improvement with regular intake. Pts daughter present for education. Will f/u for tolerance.  Impact on safety and function Moderate aspiration risk   CHL IP TREATMENT RECOMMENDATION 07/10/2016 Treatment Recommendations Therapy as outlined in treatment plan below   Prognosis 07/10/2016 Prognosis for Safe Diet Advancement Fair Barriers to Reach Goals Cognitive deficits Barriers/Prognosis Comment -- CHL IP DIET RECOMMENDATION 07/10/2016 SLP Diet Recommendations Dysphagia 1 (Puree) solids;Nectar thick liquid Liquid Administration via Cup;Straw Medication Administration Crushed with puree Compensations Slow rate;Small sips/bites;Chin tuck;Multiple dry swallows after each bite/sip;Clear throat intermittently Postural Changes Remain semi-upright after after feeds/meals (Comment);Seated upright at 90 degrees   CHL IP OTHER RECOMMENDATIONS 07/10/2016 Recommended Consults -- Oral Care Recommendations  Oral care  BID Other Recommendations Have oral suction available;Order thickener from pharmacy   CHL IP FOLLOW UP RECOMMENDATIONS 07/10/2016 Follow up Recommendations Skilled Nursing facility   [facility] IP FREQUENCY AND DURATION 07/10/2016 Speech Therapy Frequency (ACUTE ONLY) min 2x/week Treatment Duration 2 weeks      CHL IP ORAL PHASE 07/10/2016 Oral Phase Impaired Oral - Pudding Teaspoon -- Oral - Pudding Cup -- Oral - Honey Teaspoon -- Oral - Honey Cup -- Oral - Nectar Teaspoon Lingual/palatal residue;Weak lingual manipulation;Reduced posterior propulsion;Decreased bolus cohesion;Delayed oral transit Oral - Nectar Cup Lingual/palatal residue;Weak lingual manipulation;Reduced posterior propulsion;Decreased bolus cohesion;Delayed oral transit Oral - Nectar Straw Lingual/palatal residue;Weak lingual manipulation;Reduced posterior propulsion;Decreased bolus cohesion;Delayed oral transit Oral - Thin Teaspoon -- Oral - Thin Cup Lingual/palatal residue;Weak lingual manipulation;Reduced posterior propulsion;Decreased bolus cohesion;Delayed oral transit Oral - Thin Straw -- Oral - Puree -- Oral - Mech Soft Lingual/palatal residue;Weak lingual manipulation;Reduced posterior propulsion;Delayed oral transit Oral - Regular -- Oral - Multi-Consistency -- Oral - Pill Lingual/palatal residue;Weak lingual manipulation;Reduced posterior propulsion;Decreased bolus cohesion;Delayed oral transit Oral Phase - Comment --  CHL IP PHARYNGEAL PHASE 07/10/2016 Pharyngeal Phase Impaired Pharyngeal- Pudding Teaspoon -- Pharyngeal -- Pharyngeal- Pudding Cup -- Pharyngeal -- Pharyngeal- Honey Teaspoon -- Pharyngeal -- Pharyngeal- Honey Cup -- Pharyngeal -- Pharyngeal- Nectar Teaspoon Reduced tongue base retraction;Reduced laryngeal elevation;Reduced epiglottic inversion;Reduced pharyngeal peristalsis;Reduced anterior laryngeal mobility;Pharyngeal residue - valleculae;Compensatory strategies attempted (with notebox);Penetration/Apiration after  swallow;Penetration/Aspiration during swallow Pharyngeal Material enters airway, remains ABOVE vocal cords and not ejected out Pharyngeal- Nectar Cup Reduced tongue base retraction;Reduced laryngeal elevation;Reduced epiglottic inversion;Reduced pharyngeal peristalsis;Reduced anterior laryngeal mobility;Pharyngeal residue - valleculae;Compensatory strategies attempted (with notebox);Penetration/Apiration after swallow;Penetration/Aspiration during swallow Pharyngeal Material does not enter airway;Material enters airway, remains ABOVE vocal cords and not ejected out Pharyngeal- Nectar Straw Reduced tongue base retraction;Reduced laryngeal elevation;Reduced epiglottic inversion;Reduced pharyngeal peristalsis;Reduced anterior laryngeal mobility;Pharyngeal residue - valleculae;Compensatory strategies attempted (with notebox);Penetration/Apiration after swallow;Penetration/Aspiration during swallow Pharyngeal Material does not enter airway Pharyngeal- Thin Teaspoon Reduced tongue base retraction;Reduced laryngeal elevation;Reduced epiglottic inversion;Reduced pharyngeal peristalsis;Reduced anterior laryngeal mobility;Pharyngeal residue - valleculae;Compensatory strategies attempted (with notebox);Penetration/Apiration after swallow;Penetration/Aspiration during swallow Pharyngeal Material enters airway, remains ABOVE vocal cords and not ejected out Pharyngeal- Thin Cup Reduced tongue base retraction;Reduced laryngeal elevation;Reduced epiglottic inversion;Reduced pharyngeal peristalsis;Reduced anterior laryngeal mobility;Pharyngeal residue - valleculae;Compensatory strategies attempted (with notebox);Penetration/Apiration after swallow;Penetration/Aspiration during swallow Pharyngeal Material enters airway, remains ABOVE vocal cords and not ejected out Pharyngeal- Thin Straw Reduced tongue base retraction;Reduced laryngeal elevation;Reduced epiglottic inversion;Reduced pharyngeal peristalsis;Reduced anterior laryngeal  mobility;Pharyngeal residue - valleculae;Compensatory strategies attempted (with notebox);Penetration/Apiration after swallow;Penetration/Aspiration during swallow;Moderate aspiration Pharyngeal Material enters airway, passes BELOW cords without attempt by patient to eject out (silent aspiration) Pharyngeal- Puree Reduced tongue base retraction;Reduced laryngeal elevation;Reduced epiglottic inversion;Reduced pharyngeal peristalsis;Reduced anterior laryngeal mobility;Pharyngeal residue - valleculae;Compensatory strategies attempted (with notebox) Pharyngeal Material does not enter airway Pharyngeal- Mechanical Soft -- Pharyngeal -- Pharyngeal- Regular Reduced tongue base retraction;Reduced laryngeal elevation;Reduced epiglottic inversion;Reduced pharyngeal peristalsis;Reduced anterior laryngeal mobility;Pharyngeal residue - valleculae;Compensatory strategies attempted (with notebox) Pharyngeal -- Pharyngeal- Multi-consistency -- Pharyngeal -- Pharyngeal- Pill -- Pharyngeal -- Pharyngeal Comment --  No flowsheet data found. No flowsheet data found. Herbie [place], [place] CCC-SLP [id number] [place], Katherene Ponto 07/10/2016, 3:24 PM              Mr Brain Ltd W/o Cm  Result Date: 07/03/2016 CLINICAL DATA:  82 y/o F; slurred speech, a aphasia, and right leg weakness. EXAM: MRI HEAD WITHOUT CONTRAST TECHNIQUE: Sagittal T1, axial DWI, and coronal DWI sequences were acquired. The  patient was unable to continue the exam. COMPARISON:  07/02/2016 CT head.  06/25/2016 MR head. FINDINGS: Motion degraded sagittal T1 and axial DWI sequences. No diffusion signal abnormality is identified. Hyperostosis frontalis interna. Moderate parenchymal volume loss. T1 hypo intense foci in subcortical and periventricular white matter as well as within the if corpus callosum are consistent with chronic microvascular ischemic changes and small lacunar infarcts. Mucous thickening within the sphenoid sinus. IMPRESSION: No diffusion signal  abnormality to suggest acute/early subacute infarct. Electronically Signed   By: Kristine Garbe M.D.   On: 07/03/2016 00:22   Dg Hip Unilat With Pelvis 2-3 Views Right  Result Date: 06/30/2016 CLINICAL DATA:  Fall with right anterior hip pain. Initial encounter. EXAM: DG HIP (WITH OR WITHOUT PELVIS) 2-3V RIGHT COMPARISON:  None. FINDINGS: No evidence of pelvic ring fracture or diastasis. Both hips are located and appear intact. Osteopenia. Advanced lumbar disc and facet degeneration with partly seen dextroscoliosis. IMPRESSION: No acute finding. Electronically Signed   By: Monte Fantasia M.D.   On: 06/30/2016 16:04   Ct Maxillofacial Wo Cm  Result Date: 06/30/2016 CLINICAL DATA:  Status post fall today while walking. Hematoma above the right eye. Initial encounter. EXAM: CT HEAD WITHOUT CONTRAST CT MAXILLOFACIAL WITHOUT CONTRAST CT CERVICAL SPINE WITHOUT CONTRAST TECHNIQUE: Multidetector CT imaging of the head, cervical spine, and maxillofacial structures were performed using the standard protocol without intravenous contrast. Multiplanar CT image reconstructions of the cervical spine and maxillofacial structures were also generated. COMPARISON:  CT head CT scan 06/24/2016.  Brain MRI 06/25/2016. FINDINGS: CT HEAD FINDINGS Brain: There is cortical atrophy and chronic microvascular ischemic change. No evidence of acute intracranial abnormality including hemorrhage, infarct, mass lesion, mass effect, midline shift or abnormal extra-axial fluid collection. No hydrocephalus or pneumocephalus. Vascular: Extensive atherosclerosis is seen. Skull: Intact. Sinuses/Orbits: See below. Other: None. CT MAXILLOFACIAL FINDINGS Osseous: No fracture. Mandibular condyles are located with marked degenerative change seen bilaterally. Orbits: Unremarkable. Sinuses: Clear. Soft tissues: Hematoma is seen about the right eye. Limited intracranial: See above. CT CERVICAL SPINE FINDINGS Alignment: 0.3 cm anterolisthesis C3  on C4 and C4 on C5 due to facet arthropathy is identified. There is also trace anterolisthesis C7 on T1. Skull base and vertebrae: No fracture Soft tissues and spinal canal: No epidural hematoma is identified. Carotid atherosclerosis is noted. Disc levels: There is marked loss of disc space height at C5-6 and C6-7. Milder degree of degenerative disc disease is noted at C4-5. Upper chest: Single small calcified pleural plaque in the right apex is noted. Otherwise unremarkable. Other: None. IMPRESSION: Contusion about the right eye. No other acute abnormality head, face or cervical spine. Extensive atrophy and chronic microvascular ischemic change. Multilevel cervical spondylosis. Atherosclerosis. Electronically Signed   By: Inge Rise M.D.   On: 06/30/2016 16:08     Discharge Exam: Vitals:   07/11/16 0526 07/11/16 1415  BP: 132/67 118/65  Pulse: 74 79  Resp: 18   Temp: 97.9 F (36.6 C) 97.7 F (36.5 C)   Vitals:   07/10/16 0559 07/10/16 2342 07/11/16 0526 07/11/16 1415  BP: (!) 143/76 125/61 132/67 118/65  Pulse: 79 69 74 79  Resp: 17 18 18    Temp: 97.5 F (36.4 C) 98.1 F (36.7 C) 97.9 F (36.6 C) 97.7 F (36.5 C)  TempSrc: Oral Oral Oral Oral  SpO2: 99% 94% 96% 93%    General exam: Appears calm and comfortable, some what confuse  HEENT: OP moist Respiratory system: Clear to auscultation. No wheezes,  or rhonchi Cardiovascular system: S1 & S2 heard, RRR Systolic murmur 3/6. No JVD, rubs or gallops Gastrointestinal system: Abdomen is nondistended, soft and nontender. No organomegaly or masses felt.  Central nervous system: No focal neurological deficits. Follow commands  Extremities: No pedal edema. Symmetric, strength 5/5   Skin: No rashes or ulcers, ecchymosis extending from her R frontal area down to her neck.    The results of significant diagnostics from this hospitalization (including imaging, microbiology, ancillary and laboratory) are listed below for reference.      Microbiology: Recent Results (from the past 240 hour(s))  Urine culture     Status: Abnormal   Collection Time: 07/02/16  9:42 PM  Result Value Ref Range Status   Specimen Description Urine  Final   Special Requests NONE  Final   Culture <10,000 COLONIES/mL INSIGNIFICANT GROWTH (A)  Final   Report Status 07/04/2016 FINAL  Final  MRSA PCR Screening     Status: None   Collection Time: 07/04/16  5:23 AM  Result Value Ref Range Status   MRSA by PCR NEGATIVE NEGATIVE Final    Comment:        The GeneXpert MRSA Assay (FDA approved for NASAL specimens only), is one component of a comprehensive MRSA colonization surveillance program. It is not intended to diagnose MRSA infection nor to guide or monitor treatment for MRSA infections.   Blood Culture (routine x 2)     Status: None (Preliminary result)   Collection Time: 07/07/16  2:45 PM  Result Value Ref Range Status   Specimen Description BLOOD RIGHT FOREARM  Final   Special Requests BOTTLES DRAWN AEROBIC AND ANAEROBIC 10CC  Final   Culture NO GROWTH 4 DAYS  Final   Report Status PENDING  Incomplete  Blood Culture (routine x 2)     Status: None (Preliminary result)   Collection Time: 07/07/16  2:50 PM  Result Value Ref Range Status   Specimen Description BLOOD RIGHT HAND  Final   Special Requests BOTTLES DRAWN AEROBIC AND ANAEROBIC 5CC  Final   Culture NO GROWTH 4 DAYS  Final   Report Status PENDING  Incomplete  Urine culture     Status: None   Collection Time: 07/08/16  6:28 AM  Result Value Ref Range Status   Specimen Description URINE, RANDOM  Final   Special Requests NONE  Final   Culture NO GROWTH  Final   Report Status 07/09/2016 FINAL  Final     Labs: BNP (last 3 results)  Recent Labs  07/07/16 1614  BNP [id number]*   Basic Metabolic Panel:  Recent Labs Lab 07/05/16 0442  07/08/16 0446 07/09/16 0514 07/09/16 1502 07/10/16 0534 07/11/16 0601  NA 136  < > 153* 151* 151* 152* 144  K 2.9*  < > 3.5 3.5 3.6  3.5 3.1*  CL 102  < > 118* 118* 119* 113* 107  CO2 24  < > 27 30 29  32 29  GLUCOSE 79  < > 147* 130* 108* 97 108*  BUN 33*  < > 40* 28* 24* 23* 21*  CREATININE 0.65  < > 0.53 0.42* 0.39* 0.45 0.36*  CALCIUM 9.0  < > 9.3 9.1 9.3 9.2 8.9  MG 2.0  --   --   --   --   --   --   < > = values in this interval not displayed. Liver Function Tests:  Recent Labs Lab 07/07/16 1502 07/08/16 0446  AST 46* 34  ALT 38 31  ALKPHOS 50 40  BILITOT 1.2 1.0  PROT 6.7 5.6*  ALBUMIN 3.6 2.9*   No results for input(s): LIPASE, AMYLASE in the last 168 hours. No results for input(s): AMMONIA in the last 168 hours. CBC:  Recent Labs Lab 07/06/16 0628 07/07/16 1502 07/08/16 0446 07/09/16 0514 07/10/16 0534  WBC 15.5* 15.1* 12.3* 11.0* 12.5*  NEUTROABS  --  11.5*  --  7.6 9.2*  HGB 11.5* 12.7 10.8* 10.0* 10.6*  HCT 35.9* 40.2 35.3* 33.2* 35.0*  MCV 89.1 91.6 92.2 92.7 93.6  PLT 341 314 233 160 134*   Cardiac Enzymes:  Recent Labs Lab 07/07/16 1540 07/07/16 2328 07/08/16 0446  TROPONINI 0.15* 0.11* 0.10*   BNP: Invalid input(s): POCBNP CBG:  Recent Labs Lab 07/07/16 1437  GLUCAP 106*   D-Dimer No results for input(s): DDIMER in the last 72 hours. Hgb A1c No results for input(s): HGBA1C in the last 72 hours. Lipid Profile No results for input(s): CHOL, HDL, LDLCALC, TRIG, CHOLHDL, LDLDIRECT in the last 72 hours. Thyroid function studies No results for input(s): TSH, T4TOTAL, T3FREE, THYROIDAB in the last 72 hours.  Invalid input(s): FREET3 Anemia work up No results for input(s): VITAMINB12, FOLATE, FERRITIN, TIBC, IRON, RETICCTPCT in the last 72 hours. Urinalysis    Component Value Date/Time   COLORURINE YELLOW 07/08/2016 0628   APPEARANCEUR CLEAR 07/08/2016 0628   LABSPEC 1.025 07/08/2016 0628   PHURINE 6.0 07/08/2016 0628   GLUCOSEU NEGATIVE 07/08/2016 0628   HGBUR NEGATIVE 07/08/2016 0628   BILIRUBINUR SMALL (A) 07/08/2016 0628   KETONESUR 15 (A) 07/08/2016 0628    PROTEINUR 30 (A) 07/08/2016 0628   UROBILINOGEN 0.2 10/15/2014 0125   NITRITE NEGATIVE 07/08/2016 0628   LEUKOCYTESUR NEGATIVE 07/08/2016 0628   Sepsis Labs Invalid input(s): PROCALCITONIN,  WBC,  LACTICIDVEN Microbiology Recent Results (from the past 240 hour(s))  Urine culture     Status: Abnormal   Collection Time: 07/02/16  9:42 PM  Result Value Ref Range Status   Specimen Description Urine  Final   Special Requests NONE  Final   Culture <10,000 COLONIES/mL INSIGNIFICANT GROWTH (A)  Final   Report Status 07/04/2016 FINAL  Final  MRSA PCR Screening     Status: None   Collection Time: 07/04/16  5:23 AM  Result Value Ref Range Status   MRSA by PCR NEGATIVE NEGATIVE Final    Comment:        The GeneXpert MRSA Assay (FDA approved for NASAL specimens only), is one component of a comprehensive MRSA colonization surveillance program. It is not intended to diagnose MRSA infection nor to guide or monitor treatment for MRSA infections.   Blood Culture (routine x 2)     Status: None (Preliminary result)   Collection Time: 07/07/16  2:45 PM  Result Value Ref Range Status   Specimen Description BLOOD RIGHT FOREARM  Final   Special Requests BOTTLES DRAWN AEROBIC AND ANAEROBIC 10CC  Final   Culture NO GROWTH 4 DAYS  Final   Report Status PENDING  Incomplete  Blood Culture (routine x 2)     Status: None (Preliminary result)   Collection Time: 07/07/16  2:50 PM  Result Value Ref Range Status   Specimen Description BLOOD RIGHT HAND  Final   Special Requests BOTTLES DRAWN AEROBIC AND ANAEROBIC 5CC  Final   Culture NO GROWTH 4 DAYS  Final   Report Status PENDING  Incomplete  Urine culture     Status: None   Collection Time: 07/08/16  6:28 AM  Result Value Ref Range Status   Specimen Description URINE, RANDOM  Final   Special Requests NONE  Final   Culture NO GROWTH  Final   Report Status 07/09/2016 FINAL  Final    Time coordinating discharge: Over 30  minutes  SIGNED:  Chipper Oman, MD  [facility] 07/11/2016, 2:36 PM Pager   If 7PM-7AM, please contact night-coverage [email or web address] Password [email or web address]

## 2016-07-12 LAB — CULTURE, BLOOD (ROUTINE X 2)
CULTURE: NO GROWTH
Culture: NO GROWTH

## 2016-07-13 ENCOUNTER — Encounter: Payer: Self-pay | Admitting: Adult Health

## 2016-07-13 ENCOUNTER — Non-Acute Institutional Stay (SKILLED_NURSING_FACILITY): Payer: Medicare Other | Admitting: Adult Health

## 2016-07-13 DIAGNOSIS — I679 Cerebrovascular disease, unspecified: Secondary | ICD-10-CM

## 2016-07-13 DIAGNOSIS — D72829 Elevated white blood cell count, unspecified: Secondary | ICD-10-CM

## 2016-07-13 DIAGNOSIS — M6281 Muscle weakness (generalized): Secondary | ICD-10-CM

## 2016-07-13 DIAGNOSIS — G9341 Metabolic encephalopathy: Secondary | ICD-10-CM

## 2016-07-13 DIAGNOSIS — G960 Cerebrospinal fluid leak: Secondary | ICD-10-CM

## 2016-07-13 DIAGNOSIS — M25551 Pain in right hip: Secondary | ICD-10-CM | POA: Diagnosis not present

## 2016-07-13 DIAGNOSIS — R1312 Dysphagia, oropharyngeal phase: Secondary | ICD-10-CM | POA: Diagnosis not present

## 2016-07-13 DIAGNOSIS — D181 Lymphangioma, any site: Secondary | ICD-10-CM | POA: Diagnosis not present

## 2016-07-13 DIAGNOSIS — Z7189 Other specified counseling: Secondary | ICD-10-CM | POA: Diagnosis not present

## 2016-07-13 DIAGNOSIS — I10 Essential (primary) hypertension: Secondary | ICD-10-CM

## 2016-07-13 DIAGNOSIS — D649 Anemia, unspecified: Secondary | ICD-10-CM

## 2016-07-13 DIAGNOSIS — R339 Retention of urine, unspecified: Secondary | ICD-10-CM | POA: Insufficient documentation

## 2016-07-13 DIAGNOSIS — R682 Dry mouth, unspecified: Secondary | ICD-10-CM

## 2016-07-13 DIAGNOSIS — E860 Dehydration: Secondary | ICD-10-CM

## 2016-07-13 DIAGNOSIS — G9608 Other cranial cerebrospinal fluid leak: Secondary | ICD-10-CM

## 2016-07-13 DIAGNOSIS — K117 Disturbances of salivary secretion: Secondary | ICD-10-CM

## 2016-07-13 DIAGNOSIS — R1310 Dysphagia, unspecified: Secondary | ICD-10-CM | POA: Insufficient documentation

## 2016-07-13 LAB — BASIC METABOLIC PANEL
BUN: 14 mg/dL (ref 4–21)
Creatinine: 0.3 mg/dL — AB (ref 0.5–1.1)
Glucose: 84 mg/dL
Potassium: 3.8 mmol/L (ref 3.4–5.3)
Sodium: 139 mmol/L (ref 137–147)

## 2016-07-13 LAB — CBC AND DIFFERENTIAL
HCT: 30 % — AB (ref 36–46)
Hemoglobin: 10 g/dL — AB (ref 12.0–16.0)
Platelets: 188 10*3/uL (ref 150–399)
WBC: 10.3 10^3/mL

## 2016-07-13 NOTE — Progress Notes (Signed)
Late entry for missed gcode    06/25/16 1413  OT Time Calculation  OT Start Time (ACUTE ONLY) 1202  OT Stop Time (ACUTE ONLY) 1229  OT Time Calculation (min) 27 min  OT G-codes **NOT FOR INPATIENT CLASS**  Functional Assessment Tool Used Clinical judgement  Functional Limitation Self care  Self Care Current Status [id number]) CI  Self Care Goal Status [id number]) CI  Self Care Discharge Status [id number]) CI  OT General Charges  $OT Visit 1 Procedure  OT Evaluation  $OT Eval Moderate Complexity 1 Procedure  OT Treatments  $Self Care/Home Management  8-22 mins   Truman Hayward M.S., OTR/L Pager: [phone]

## 2016-07-13 NOTE — Progress Notes (Signed)
Patient ID: Whitney Marquez, female   DOB: [birth year], 82 y.o.   MRN: [id number]  Location:  [employer]:  SNF (31) Provider:   Cindi Carbon, [place] [phone]   Mathews Argyle, MD  Patient Care Team: Lajean Manes, MD as PCP - General (Internal Medicine) Lajean Manes, MD (Internal Medicine)  Extended Emergency Contact Information Primary Emergency Contact: [place] Mobile Phone: [phone] Relation: Daughter Secondary Emergency Contact: Burbach,Valia  [place] Phone: [phone] Relation: Daughter  Code Status:  DNR Goals of care: Advanced Directive information Advanced Directives 07/13/2016  Does patient have an advance directive? -  Type of Advance Directive Out of facility DNR (pink MOST or yellow form)  Does patient want to make changes to advanced directive? -  Copy of advanced directive(s) in chart? Yes     Chief Complaint  Patient presents with  . Hospitalization Follow-up    HPI:  Pt is a 82 y.o. female seen today for a hospital f/u s/p admission from 10/6-10/10/17.  She has a history of aortic stenosis, HTN, HLD, and breast and colon cancers who up until recently, was a resident in an independent living facility. She was admitted to the hospital 9/23-9/25 after with a UTI. She was discharged on full strength Bactrim. She was back in the ED on 9/29 after having a fall that resulted in a laceration over her right eye and extensive bruising involving most of her right face and neck. She was diagnosed with UTI again and was discharged to home with antibiotics. Patient re-admitted again on  07/08/2016 with acute metabolic encephalopathy, likely due to hypernatremia and dehydration. Patient was started on IVF and subsequently improved.  CT of the head on 10/6 showed bilateral subdural hygromas. Surgery was consulted and they indicated that she  was not a candidate for surgical intervention.  All urine since her initial bout of confusion in Sept have been normal. She had issues with leukocytosis with unclear, 12.5 on discharge. Blood cx neg. She has significant dysphagia that is new, now recommendations are for a D1 diet with NTL.  Since her arrival to rehab she has not been able to eat and drink well. Also had urinary retention and a catheter was place don 10/11.  She has trouble clearing her airway and has some gurgling sounds per the nurse.  Sats are normal, no fever. CXR neg for pna on 10/6.   She previously walked with a walker but now is very weak and a hoyer lift.  She has multiple children. They have agreed to a DNR (which was initially changed in the hospital).  Her most form indicates no tube feeding but they would like antibiotics if needed, IVF, and transfers to the hospital.   She also has some a hx of right knee pain.  Lido caine was ordered but will cost $200.   MRI of the brain on 9/24 showed the following IMPRESSION: 1. No acute intracranial abnormality. 2. Moderate chronic small vessel ischemic disease with chronic lacunar infarcts as above, progressed from 2009. 3. No major intracranial arterial occlusion or significant stenosis. 4. 50% distal right common carotid artery stenosis. 5. Tortuous left cervical ICA with focal kinking. 6. Moderate to severe proximal external carotid artery stenosis bilaterally. 7. Suspected mild fibromuscular dysplasia in the vertebral arteries. She is on aspirin therapy.   Past Medical History:  Diagnosis Date  . Aortic valve stenosis   .  Arthritis   . Cancer [facility])    breast and colon  . Cancer ([place])   . Cataract   . Cataracts, both eyes   . Hypertension   . Plantar keratosis   . Scoliosis    Past Surgical History:  Procedure Laterality Date  . BACK SURGERY    . BREAST SURGERY     breast cancer  . COLON SURGERY     colon cancer  . COLON SURGERY    . LUMBAR LAMINECTOMY      . meniscus tear    . TOE AMPUTATION     second toe    Allergies  Allergen Reactions  . Cephalosporins Anaphylaxis  . Ciprofloxacin Other (See Comments)    Son reports seizure-like activity  . Lisinopril Other (See Comments)    UNKNOWN REACTION on MAR  . Other Swelling    Walnuts causes lips swelling and ALL NUTS, per [facility] ANESTHESIA (as noted on MAR)  . Pyridium [Phenazopyridine Hcl] Other (See Comments)    UNKNOWN  . Aleve [Naproxen] Rash  . Iodine Itching and Rash  . Macrobid [Nitrofurantoin Monohyd Macro] Swelling and Rash  . Penicillins Hives and Rash    Has patient had a PCN reaction causing immediate rash, facial/tongue/throat swelling, SOB or lightheadedness with hypotension: Yes Has patient had a PCN reaction causing severe rash involving mucus membranes or skin necrosis: Unknown Has patient had a PCN reaction that required hospitalization: Unknown Has patient had a PCN reaction occurring within the last 10 years: No If all of the above answers are "NO", then may proceed with Cephalosporin use.   . Shellfish Allergy Itching and Rash      Medication List       Accurate as of 07/13/16 10:48 AM. Always use your most recent med list.          acetaminophen 325 MG tablet Commonly known as:  TYLENOL Take 2 tablets (650 mg total) by mouth every 6 (six) hours as needed for mild pain (or Fever >/= 101).   ARTIFICIAL TEARS 0.4 % Soln Generic drug:  Hypromellose Apply 2 drops to eye 3 (three) times daily.   aspirin 81 MG tablet Take 81 mg by mouth daily with breakfast.   BIOTENE DRY MOUTH MOISTURIZING Soln See admin instructions. 1-2 sprays into the mouth three times a day   Calcium-Vitamin D-Vitamin K 500-100-40 MG-UNT-MCG Chew Chew 1 tablet by mouth 3 (three) times daily with meals.   lidocaine 5 % Commonly known as:  LIDODERM Place 2 patches onto the skin daily. Remove & Discard patch within 12 hours or as directed by MD (knee and back as needed for pain)    omeprazole 20 MG capsule Commonly known as:  PRILOSEC Take 20 mg by mouth daily.   REFRESH OP Apply 2 drops to eye at bedtime as needed (for dry eyes).   RESOURCE THICKENUP CLEAR Powd Pudding thick liquids       Review of Systems  Unable to perform ROS: Dementia    Immunization History  Administered Date(s) Administered  . PPD Test 10/20/2014   Pertinent  Health Maintenance Due  Topic Date Due  . DEXA SCAN  10/31/1989  . PNA vac Low Risk Adult (1 of 2 - PCV13) 10/31/1989  . INFLUENZA VACCINE  05/02/2016   No flowsheet data found. Functional Status Survey:    Vitals:   07/13/16 1042  BP: (!) 159/84  Pulse: (!) 101  Resp: (!) 23  Temp: 97 F (36.1 C)  SpO2:  92%  Weight: 113 lb (51.3 kg)   Body mass index is 20.02 kg/m. Physical Exam  Constitutional: No distress.  HENT:  Nose: Nose normal.  Yellow mucus to OP.  No erythema. Very dry mouth. Bruising to right orbit  Eyes: Conjunctivae and EOM are normal. Pupils are equal, round, and reactive to light. Right eye exhibits no discharge. Left eye exhibits no discharge.  Neck: No JVD present.  Cardiovascular: Normal rate and regular rhythm.   No murmur heard. Trace edema to right leg  Pulmonary/Chest: Effort normal. No respiratory distress.  bilat rhonchi  Abdominal: Soft. Bowel sounds are normal. She exhibits no distension.  Musculoskeletal: She exhibits no edema or tenderness.  Lymphadenopathy:    She has no cervical adenopathy.  Neurological: She is alert. No cranial nerve deficit.  Intermittently able to follow commands. MAE but very weak  Skin: Skin is warm and dry. She is not diaphoretic.  Psychiatric: She has a normal mood and affect.    Labs reviewed:  Recent Labs  07/05/16 0442  07/09/16 1502 07/10/16 0534 07/11/16 0601  NA 136  < > 151* 152* 144  K 2.9*  < > 3.6 3.5 3.1*  CL 102  < > 119* 113* 107  CO2 24  < > 29 32 29  GLUCOSE 79  < > 108* 97 108*  BUN 33*  < > 24* 23* 21*  CREATININE  0.65  < > 0.39* 0.45 0.36*  CALCIUM 9.0  < > 9.3 9.2 8.9  MG 2.0  --   --   --   --   < > = values in this interval not displayed.  Recent Labs  07/02/16 2010 07/07/16 1502 07/08/16 0446  AST 23 46* 34  ALT 17 38 31  ALKPHOS 42 50 40  BILITOT 0.6 1.2 1.0  PROT 6.2* 6.7 5.6*  ALBUMIN 3.5 3.6 2.9*    Recent Labs  07/07/16 1502 07/08/16 0446 07/09/16 0514 07/10/16 0534  WBC 15.1* 12.3* 11.0* 12.5*  NEUTROABS 11.5*  --  7.6 9.2*  HGB 12.7 10.8* 10.0* 10.6*  HCT 40.2 35.3* 33.2* 35.0*  MCV 91.6 92.2 92.7 93.6  PLT 314 233 160 134*   Lab Results  Component Value Date   TSH 4.127 07/02/2016   Lab Results  Component Value Date   HGBA1C 5.5 06/25/2016   Lab Results  Component Value Date   CHOL 124 06/25/2016   HDL 76 06/25/2016   LDLCALC 33 06/25/2016   TRIG 75 06/25/2016   CHOLHDL 1.6 06/25/2016    Significant Diagnostic Results in last 30 days:  Ct Head Wo Contrast  Result Date: 07/07/2016 CLINICAL DATA:  Altered mental status. Patient unresponsive. Hypoxia. Recent fall. EXAM: CT HEAD WITHOUT CONTRAST TECHNIQUE: Contiguous axial images were obtained from the base of the skull through the vertex without intravenous contrast. COMPARISON:  CT head 07/02/2016.  Initial CT head 06/30/2016. FINDINGS: Brain: Global atrophy. Small vessel disease. No hydrocephalus or intra-axial mass lesion. No features are seen to confirm cerebral infarction or anoxic injury. Increasing BILATERAL extra-axial CSF like fluid collections consistent with hygromas. 13 mm thick on the RIGHT and 12 mm thick on the LEFT, significant progression from most recent study 5 days ago. Sulcal flattening is present, without herniation. Vascular: No hyperdense vessel or unexpected calcification. Skull: Normal. Negative for fracture or focal lesion. RIGHT frontal scalp hematoma redemonstrated. Sinuses/Orbits: No acute finding. Other: None. IMPRESSION: Significant increase in BILATERAL subdural hygromas since  06/30/16 and 07/02/2016. LEFT untreated, increased  intracranial pressure or herniation could occur. Depending on the patient's clinical condition, neurosurgical consultation may be warranted. These results were called by telephone at the time of interpretation on 07/07/2016 at 5:29 pm to Dr. Jola Schmidt , who verbally acknowledged these results. Electronically Signed   By: Staci Righter M.D.   On: 07/07/2016 17:31   Dg Chest Portable 1 View  Result Date: 07/07/2016 CLINICAL DATA:  Shortness of breath. EXAM: PORTABLE CHEST 1 VIEW COMPARISON:  07/02/2016 FINDINGS: Chronic elevation of the right hemidiaphragm. Heart and mediastinum are stable. Aortic arch has atherosclerotic calcifications. Surgical clips in the right axilla. Subtle interstitial densities throughout both lungs could represent mild edema versus chronic change. Negative for a pneumothorax. IMPRESSION: Question mild interstitial edema. Stable elevation of the right hemidiaphragm. Aortic atherosclerosis. Electronically Signed   By: Markus Daft M.D.   On: 07/07/2016 15:57    Assessment/Plan  1. Acute metabolic encephalopathy -Due to hygromas, dehydration Improved with increase alertness but still lethargic. Intermittently alert and able to follow commands. Agitated at night. Fall risk Avoid sedatives if possible. Has a sitter at night to help with agitation.   Needs PT and OT but must be more alert to participate.  2. Subdural hygroma Not a surgical canddiate  3. Anemia, unspecified type Check CBC  4. Dehydration Check BMP, will most likely need IVF  5. Leukocytosis, unspecified type Monitor CBC, ? Etiology (stress, dehydration)  6. Muscle weakness (generalized) PT and OT  7. Cerebrovascular disease Noted on MRI Continue ASA 81 mg qd  8. Essential hypertension Controlled without meds  9. Right hip pain Denies hip or knee pain at this time Due to the cost of lidocaine will d/c now and if pain reoccurs consider  reordering.  10. Urinary retention Maintain foley cath, voiding trials next week if more alert  11. Oropharyngeal dysphagia Asp prec Continue modified diet ST Oral suction as needed  12. Goals of care, counseling/discussion Discussed her case with her family. They would like [place] to continue to hydrate her with IVF if necessary while wait and see if she becomes more alert and able to participate with therapy.  She will remain a DNR and no tube feeding. If she does not improve by next week, will re evaluate goals  Of care.  Palliative care to follow.  13. Xerostomia Biotene 2 sprays QID with oral care   Family/ staff Communication: discussed with three daughters and nurse  Labs/tests ordered:  CBC BMP stat

## 2016-07-18 ENCOUNTER — Encounter: Payer: Self-pay | Admitting: Internal Medicine

## 2016-07-18 ENCOUNTER — Non-Acute Institutional Stay (SKILLED_NURSING_FACILITY): Payer: Medicare Other | Admitting: Internal Medicine

## 2016-07-18 DIAGNOSIS — R410 Disorientation, unspecified: Secondary | ICD-10-CM | POA: Diagnosis not present

## 2016-07-18 DIAGNOSIS — D181 Lymphangioma, any site: Secondary | ICD-10-CM | POA: Diagnosis not present

## 2016-07-18 DIAGNOSIS — E870 Hyperosmolality and hypernatremia: Secondary | ICD-10-CM

## 2016-07-18 DIAGNOSIS — I679 Cerebrovascular disease, unspecified: Secondary | ICD-10-CM

## 2016-07-18 DIAGNOSIS — G9608 Other cranial cerebrospinal fluid leak: Secondary | ICD-10-CM

## 2016-07-18 DIAGNOSIS — G960 Cerebrospinal fluid leak: Secondary | ICD-10-CM

## 2016-07-18 DIAGNOSIS — M6281 Muscle weakness (generalized): Secondary | ICD-10-CM

## 2016-07-18 DIAGNOSIS — R339 Retention of urine, unspecified: Secondary | ICD-10-CM

## 2016-07-18 DIAGNOSIS — R748 Abnormal levels of other serum enzymes: Secondary | ICD-10-CM | POA: Diagnosis not present

## 2016-07-18 DIAGNOSIS — R778 Other specified abnormalities of plasma proteins: Secondary | ICD-10-CM

## 2016-07-18 DIAGNOSIS — R7989 Other specified abnormal findings of blood chemistry: Secondary | ICD-10-CM

## 2016-07-18 NOTE — Progress Notes (Signed)
Patient ID: Whitney Marquez, female   DOB: 01-07-1923, 82 y.o.   MRN: [id number]  Provider:  Rexene Edison. Mariea Clonts, D.O., C.M.D. Location:  [place] Room Number: 145 rehab Place of Service:  Clinic (12)  PCP: Mathews Argyle, MD  Extended Emergency Contact Information Primary Emergency Contact: Moody,Katherine  [place] Mobile Phone: [phone] Relation: Daughter Secondary Emergency Contact: Sandefer,Anitria  [place] Phone: [phone] Relation: Daughter  Code Status: DNR, MOST  Goals of Care: Advanced Directive information Advanced Directives 07/18/2016  Does patient have an advance directive? Yes  Type of Advance Directive Out of facility DNR (pink MOST or yellow form);Healthcare Power of Attorney  Does patient want to make changes to advanced directive? -  Copy of advanced directive(s) in chart? Yes  Pre-existing out of facility DNR order (yellow form or pink MOST form) Pink MOST form placed in chart (order not valid for inpatient use);Yellow form placed in chart (order not valid for inpatient use)   Chief Complaint  Patient presents with  . New Admit To SNF    rehab admission    HPI: Patient is a 82 y.o. female with h/o remission from breast and colon cancer, htn, aortic stenosis, arthritis, osteoporosis, hypertriglyceridemia seen today for her official admission to [facility] rehab.  She has actually been to the hospital 4 different times now for her delirium, the last of which, she was noted to have subdural hygromas that had never been mentioned the prior two CTs of her brain.  She has been doing poorly since her latest return, but the nurse manager reports that this morning is the best she's seen her.  She was awake, conversive and did eat breakfast and drink some fluids with assistance.  Unfortunately, when I saw her late in the morning, she was able to speak with me briefly, but had difficulty getting her  words out, was very slow, and then fell asleep in the middle of the examination.  She continued to complain of pain in her shoulders and her back.  She's been receiving IVFs since late last week through the weekend (and it's now Tuesday).  She does have urine output of dark amber colored urine in her foley catheter.  Intake is ok when she is awake and continuously stimulated to stay awake, but otherwise, she sleeps.  She is extremely weak and unable to participate in PT, OT at this point and really has not been able to throughout the admissions thus far.    Review of hospitalizations:   9/23, went to the hospital with confusion that am, disoriented, slurred speech, difficulty with left-sided vision.  She had taken a baby asa.  Ct brain was negative for acute ischemia/infarct, MRI/MRA also did not show any acute stroke, UA was positive for nitrates and leukocyte esterase, she was treated with bactrim thru 9/27.  She returned to [facility] on 9/25 with recommentations for a f/u echo, completing bactrim, PT, OT, DNR code status and heart healthy diet.  Her mental status was said to be back to baseline.  She was continued on baby asa.  She had not been witnessed doing PT before she was discharged here.  9/29, she had a fall, went back to the ED, no LOC, "not confused" and lacerations were treated.  Urine was checked again and UA positive so put on cipro this time for 5 days.  She also c/o right groin and hip pain so xrays were done which were negative for  fractures.  She was then given hydrocodone for pain and returned to [facility].  10/1, she was noted to have difficulty speaking, confusion, right leg "weakness", and was seeing bugs on the ceiling.  CT of brain was again negative for acute stroke, urine drug screen was done which was negative for marijuana.  She had a periorbital hematoma from her fall.  She had a Na of 130, WBC 11.5, kidneys were "ok" per notes.  MRI was negative also for acute stroke.  10/2, hospitalist did  note dehydration and gave her IVFs and acknowledged effect of 2 abx.  Of course, she'd also been having pain and getting hydrocodone, as well.  10/5, she was sent back to [facility] for PT, OT, ST with recommendations for palliative care or hospice care if she was not improving.  Orders were to wean oxygen as tolerated.  Delirium diagnosis listed.  10/6, her mental status was worsened and she went back to the ED again.  T was 100.9.  CT suddenly noted "increased" subdural hygromas since 9/29 and 10/1.  There had been notation of prominent ventricles.  Neurosurgery was contacted and advised against surgical intervention. Her sodium was elevated, she was "dehydrated", anemic, had elevated wbc and elevated troponin plus urinary retention.  10/10, she returned here with orders for f/u cbc, bmp in 1 wk, palliative care f/u at snf, PT, OT, ST, heart healthy dysphagia 1  Nectar thick liquids with straw, cup and full supervision and feeding.  Past Medical History:  Diagnosis Date  . Aortic valve stenosis   . Arthritis   . Cancer [facility])    breast and colon  . Cancer ([place])   . Cataract   . Cataracts, both eyes   . Hypertension   . Plantar keratosis   . Scoliosis    Past Surgical History:  Procedure Laterality Date  . BACK SURGERY    . BREAST SURGERY     breast cancer  . COLON SURGERY     colon cancer  . COLON SURGERY    . LUMBAR LAMINECTOMY    . meniscus tear    . TOE AMPUTATION     second toe    reports that she has never smoked. She has never used smokeless tobacco. She reports that she does not drink alcohol. Her drug history is not on file. Social History   Social History  . Marital status: Married    Spouse name: N/A  . Number of children: N/A  . Years of education: N/A   Occupational History  .      Social History Main Topics  . Smoking status: Never Smoker  . Smokeless tobacco: Never Used  . Alcohol use No  . Drug use: Unknown  . Sexual activity: Not on file   Other Topics  Concern  . Not on file   Social History Narrative   ** Merged History Encounter **        Functional Status Survey: Is the patient deaf or have difficulty hearing?: No Does the patient have difficulty seeing, even when wearing glasses/contacts?: No Does the patient have difficulty concentrating, remembering, or making decisions?: Yes Does the patient have difficulty walking or climbing stairs?: Yes Does the patient have difficulty dressing or bathing?: Yes Does the patient have difficulty doing errands alone such as visiting a doctor's office or shopping?: Yes AS OF THIS ADMISSION   No family history on file.  Need Dr. Carlyle Lipa H+Ps.  Health Maintenance  Topic Date Due  . TETANUS/TDAP  11/01/1943  . ZOSTAVAX  10/31/1984  . DEXA SCAN  10/31/1989  . PNA vac Low Risk Adult (1 of 2 - PCV13) 10/31/1989  . INFLUENZA VACCINE  05/02/2016    Allergies  Allergen Reactions  . Cephalosporins Anaphylaxis  . Ciprofloxacin Other (See Comments)    Son reports seizure-like activity  . Lisinopril Other (See Comments)    UNKNOWN REACTION on MAR  . Other Swelling    Walnuts causes lips swelling and ALL NUTS, per [facility] ANESTHESIA (as noted on MAR)  . Pyridium [Phenazopyridine Hcl] Other (See Comments)    UNKNOWN  . Aleve [Naproxen] Rash  . Iodine Itching and Rash  . Macrobid [Nitrofurantoin Monohyd Macro] Swelling and Rash  . Penicillins Hives and Rash    Has patient had a PCN reaction causing immediate rash, facial/tongue/throat swelling, SOB or lightheadedness with hypotension: Yes Has patient had a PCN reaction causing severe rash involving mucus membranes or skin necrosis: Unknown Has patient had a PCN reaction that required hospitalization: Unknown Has patient had a PCN reaction occurring within the last 10 years: No If all of the above answers are "NO", then may proceed with Cephalosporin use.   . Shellfish Allergy Itching and Rash      Medication List       Accurate as of  07/18/16 11:59 PM. Always use your most recent med list.          acetaminophen 325 MG tablet Commonly known as:  TYLENOL Take 2 tablets (650 mg total) by mouth every 6 (six) hours as needed for mild pain (or Fever >/= 101).   ARTIFICIAL TEARS 0.4 % Soln Generic drug:  Hypromellose Apply 2 drops to eye 3 (three) times daily.   aspirin 81 MG tablet Take 81 mg by mouth daily with breakfast.   BIOTENE DRY MOUTH MOISTURIZING Soln See admin instructions. 1-2 sprays into the mouth three times a day   REFRESH OP Apply 2 drops to eye at bedtime as needed (for dry eyes).       Review of Systems  Constitutional: Positive for activity change, appetite change and unexpected weight change. Negative for chills and fever.  HENT: Negative for congestion and hearing loss.   Eyes: Negative for visual disturbance.  Respiratory: Negative for chest tightness and shortness of breath.   Cardiovascular: Negative for chest pain, palpitations and leg swelling.       Tachy  Gastrointestinal: Negative for abdominal distention, abdominal pain, constipation and diarrhea.  Genitourinary: Negative for dysuria.       Foley with dark amber urine  Musculoskeletal: Positive for back pain, gait problem and neck pain.       Shoulder pain  Skin: Positive for color change and pallor.  Neurological: Positive for weakness. Negative for dizziness.  Psychiatric/Behavioral: Positive for confusion. Negative for sleep disturbance.       Sleeping majority of time    Vitals:   07/18/16 0923  BP: 131/77  Pulse: 86  Resp: 14  Temp: (!) 96.8 F (36 C)  TempSrc: Oral  SpO2: 95%  Weight: 113 lb (51.3 kg)  Height: 5\' 3"  (1.6 m)   Body mass index is 20.02 kg/m. Physical Exam  Constitutional: No distress.  Lethargic, pale female sitting up in bed after pills and some boost  Eyes: Conjunctivae are normal.  Neck: Neck supple. No JVD present.  Cardiovascular: Regular rhythm, normal heart sounds and intact distal  pulses.   Tachycardic during exam by me around 100  Pulmonary/Chest: Effort normal and  breath sounds normal. She has no wheezes. She has no rales.  Abdominal: Soft. Bowel sounds are normal.  Musculoskeletal: She exhibits tenderness.  Lymphadenopathy:    She has no cervical adenopathy.  Neurological:  Lethargic, fell asleep during exam, speech slow and difficulty with word-finding, is able to move all 4 extremities, pain in right hip  Skin:  Right side of face and head with ecchymoses  Psychiatric: She has a normal mood and affect.    Labs reviewed: Basic Metabolic Panel:  Recent Labs  07/05/16 0442  07/09/16 1502 07/10/16 0534 07/11/16 0601 07/13/16 1003  NA 136  < > 151* 152* 144 139  K 2.9*  < > 3.6 3.5 3.1* 3.8  CL 102  < > 119* 113* 107  --   CO2 24  < > 29 32 29  --   GLUCOSE 79  < > 108* 97 108*  --   BUN 33*  < > 24* 23* 21* 14  CREATININE 0.65  < > 0.39* 0.45 0.36* 0.3*  CALCIUM 9.0  < > 9.3 9.2 8.9  --   MG 2.0  --   --   --   --   --   < > = values in this interval not displayed. Liver Function Tests:  Recent Labs  07/02/16 2010 07/07/16 1502 07/08/16 0446  AST 23 46* 34  ALT 17 38 31  ALKPHOS 42 50 40  BILITOT 0.6 1.2 1.0  PROT 6.2* 6.7 5.6*  ALBUMIN 3.5 3.6 2.9*    Recent Labs  07/02/16 2010  LIPASE 40   No results for input(s): AMMONIA in the last 8760 hours. CBC:  Recent Labs  07/07/16 1502 07/08/16 0446 07/09/16 0514 07/10/16 0534 07/13/16 1003  WBC 15.1* 12.3* 11.0* 12.5* 10.3  NEUTROABS 11.5*  --  7.6 9.2*  --   HGB 12.7 10.8* 10.0* 10.6* 10.0*  HCT 40.2 35.3* 33.2* 35.0* 30*  MCV 91.6 92.2 92.7 93.6  --   PLT 314 233 160 134* 188   Cardiac Enzymes:  Recent Labs  07/07/16 1540 07/07/16 2328 07/08/16 0446  TROPONINI 0.15* 0.11* 0.10*   BNP: Invalid input(s): POCBNP Lab Results  Component Value Date   HGBA1C 5.5 06/25/2016   Lab Results  Component Value Date   TSH 4.127 07/02/2016   No results found for:  VITAMINB12 No results found for: FOLATE No results found for: IRON, TIBC, FERRITIN  Imaging and Procedures obtained prior to SNF admission: Ct Head Wo Contrast  Result Date: 07/07/2016 CLINICAL DATA:  Altered mental status. Patient unresponsive. Hypoxia. Recent fall. EXAM: CT HEAD WITHOUT CONTRAST TECHNIQUE: Contiguous axial images were obtained from the base of the skull through the vertex without intravenous contrast. COMPARISON:  CT head 07/02/2016.  Initial CT head 06/30/2016. FINDINGS: Brain: Global atrophy. Small vessel disease. No hydrocephalus or intra-axial mass lesion. No features are seen to confirm cerebral infarction or anoxic injury. Increasing BILATERAL extra-axial CSF like fluid collections consistent with hygromas. 13 mm thick on the RIGHT and 12 mm thick on the LEFT, significant progression from most recent study 5 days ago. Sulcal flattening is present, without herniation. Vascular: No hyperdense vessel or unexpected calcification. Skull: Normal. Negative for fracture or focal lesion. RIGHT frontal scalp hematoma redemonstrated. Sinuses/Orbits: No acute finding. Other: None. IMPRESSION: Significant increase in BILATERAL subdural hygromas since 06/30/16 and 07/02/2016. LEFT untreated, increased intracranial pressure or herniation could occur. Depending on the patient's clinical condition, neurosurgical consultation may be warranted. These results were called by  telephone at the time of interpretation on 07/07/2016 at 5:29 pm to Dr. Jola Schmidt , who verbally acknowledged these results. Electronically Signed   By: Staci Righter M.D.   On: 07/07/2016 17:31   Dg Chest Portable 1 View  Result Date: 07/07/2016 CLINICAL DATA:  Shortness of breath. EXAM: PORTABLE CHEST 1 VIEW COMPARISON:  07/02/2016 FINDINGS: Chronic elevation of the right hemidiaphragm. Heart and mediastinum are stable. Aortic arch has atherosclerotic calcifications. Surgical clips in the right axilla. Subtle interstitial  densities throughout both lungs could represent mild edema versus chronic change. Negative for a pneumothorax. IMPRESSION: Question mild interstitial edema. Stable elevation of the right hemidiaphragm. Aortic atherosclerosis. Electronically Signed   By: Markus Daft M.D.   On: 07/07/2016 15:57    Assessment/Plan 1. Delirium -hypoactive variant -unfortunately, pt has had multiple hospital stays and environmental changes, two different abx for "UTIs", urinary retention, subdural hygromas s/p fall, sodium abnormalities, some heart strain or NSTEMI last time, anemia, fall with injury and narcotic use all of which contribute to delrium in a 82 yo with some baseline cerebrovascular disease noted on her imaging -she has been rehydrated and labs are improving as of this afternoon; however, po intake remains poor and she cannot continue on IVFs long term per MOST and wishes of family -staff advised to push po fluids and try to maintain daytime wakefulness (has caregiver in the room with her) -if pt unable to stay awake due to her delirium and her hygromas, she will not improve -would not recheck labs or resume fluids if unable to take po--goals discussed have been comfort for her   2. Subdural hygroma -has periods of wakefulness, but this fluid accumulation seems to be affecting her cognition significantly and she's clearly not a surgical candidate  3. Hypernatremia -has resolved with IVFs, but question remains if this normal level can be maintained with po hydration and I doubt this unless she continues to be more alert going forward  4. Muscle weakness (generalized) -ongoing, pt bedbound at present, has air mattress due to compression fx and pain in spine, hip, shoulders, neck  5. Urinary retention -we opted to keep the foley to monitor output b/c of her poor po intake so we can discuss further with her POA and have information to provide about this -if intake remains poor over the next 24 hrs, would  then d/c foley also  6. Cerebrovascular disease -noted on brain imaging, chart has dementia diagnosis in problem list, but [place] nurse indicates pt was very alert and oriented when she knew her before all of these hospitalizations and fall  7. Elevated troponin -likely due to her intravascularly deplete state she was in -this is a poor sign for her recovery, as well  Family/ staff Communication:  Discussed with rehab RN and nurse manager   Labs/tests ordered:  No new, today's labs were reviewed after visit was completed and had improved tremendously--cbc could not be done (inadequate blood sample)

## 2016-07-24 LAB — BASIC METABOLIC PANEL
BUN: 12 mg/dL (ref 4–21)
Creatinine: 0.2 mg/dL — AB (ref 0.5–1.1)
Glucose: 100 mg/dL
Potassium: 3.4 mmol/L (ref 3.4–5.3)
Sodium: 138 mmol/L (ref 137–147)

## 2016-07-24 LAB — CBC AND DIFFERENTIAL
HCT: 29 % — AB (ref 36–46)
Hemoglobin: 9.1 g/dL — AB (ref 12.0–16.0)
Platelets: 428 10*3/uL — AB (ref 150–399)
WBC: 11 10^3/mL

## 2016-07-24 NOTE — Progress Notes (Signed)
PT Note - Late G code entry    2016/07/10 1145  PT G-Codes **NOT FOR INPATIENT CLASS**  Functional Assessment Tool Used clinical judgement  Functional Limitation Mobility: Walking and moving around  Mobility: Walking and Moving Around Current Status [phone]) CI  Mobility: Walking and Moving Around Goal Status [phone]) [place]

## 2016-07-25 ENCOUNTER — Encounter: Payer: Self-pay | Admitting: Internal Medicine

## 2016-07-25 ENCOUNTER — Non-Acute Institutional Stay (SKILLED_NURSING_FACILITY): Payer: Medicare Other | Admitting: Internal Medicine

## 2016-07-25 DIAGNOSIS — E860 Dehydration: Secondary | ICD-10-CM

## 2016-07-25 DIAGNOSIS — I679 Cerebrovascular disease, unspecified: Secondary | ICD-10-CM

## 2016-07-25 DIAGNOSIS — R410 Disorientation, unspecified: Secondary | ICD-10-CM | POA: Diagnosis not present

## 2016-07-25 DIAGNOSIS — M6281 Muscle weakness (generalized): Secondary | ICD-10-CM | POA: Diagnosis not present

## 2016-07-25 DIAGNOSIS — T83511A Infection and inflammatory reaction due to indwelling urethral catheter, initial encounter: Secondary | ICD-10-CM | POA: Diagnosis not present

## 2016-07-25 DIAGNOSIS — N390 Urinary tract infection, site not specified: Secondary | ICD-10-CM

## 2016-07-25 DIAGNOSIS — D181 Lymphangioma, any site: Secondary | ICD-10-CM | POA: Diagnosis not present

## 2016-07-25 DIAGNOSIS — G9608 Other cranial cerebrospinal fluid leak: Secondary | ICD-10-CM

## 2016-07-25 DIAGNOSIS — G960 Cerebrospinal fluid leak: Secondary | ICD-10-CM

## 2016-07-25 NOTE — Progress Notes (Signed)
Patient ID: Whitney Marquez, female   DOB: 1923/05/21, 82 y.o.   MRN: [id number]  Location:  [facility] Room Number: Mar-Mac of Service:  SNF (31) Provider:  Desteny Freeman L. Mariea Clonts, D.O., C.M.D.  Mathews Argyle, MD  Patient Care Team: Lajean Manes, MD as PCP - General (Internal Medicine) Lajean Manes, MD (Internal Medicine)  Extended Emergency Contact Information Primary Emergency Contact: Whitney Marquez of [place] Mobile Phone: [phone] Relation: Daughter Secondary Emergency Contact: Marquez,Whitney  [place] Phone: [phone] Relation: Daughter  Code Status:  DNR Goals of care: Advanced Directive information Advanced Directives 07/25/2016  Does patient have an advance directive? Yes  Type of Advance Directive Out of facility DNR (pink MOST or yellow form);Living will;Healthcare Power of Attorney  Does patient want to make changes to advanced directive? -  Copy of advanced directive(s) in chart? Yes  Pre-existing out of facility DNR order (yellow form or pink MOST form) Yellow form placed in chart (order not valid for inpatient use);Pink MOST form placed in chart (order not valid for inpatient use)   Chief Complaint  Patient presents with  . Acute Visit    HPI:  Pt is a 82 y.o. female with recent critical illness with subdural hygromas, UTI, poor po intake, delirium seen today for an acute visit for positive urine culture.  This was done due to her persistent encephalopathy, poor po intake, tachycardia and hypotension.  She was found to have aerococcus viridans in the urine.  She still has the foley in place for I/O monitoring and had more IVFs over the weekend.  This was intended to be for a short course; however, when nurse manager spoke with pt's daughter Whitney Marquez this am, she reported that two other children were coming to town this coming weekend and next week and they wanted her continued  on IVFs (unless she pulls out the IV) until further decisions could be made.  The plan established last week had been to call hospice Saturday, but that was changed based on discussion with nursing over the weekend when pt did eat and drink better and was a little bit more alert after the hydration from IVFs.  Unfortunately, with her allergies and the susceptibilities and wanting to avoid IV abx in this frail individual who has already had diarrhea related to abx (Cdiff neg), she will need to be treated with bactrim again which may worsen her renal function temporarily.  Of note, she also has a pessary and it's believed it was placed two years ago and never dealt with since.  We are waiting on the records about this.  When I saw her, she was more alert and conversant. She'd been drinking a few sips of several different beverages in her room, and had eaten breakfast.  She was still having difficulty with word finding and requiring a hoyer lift for transfers due to weakness.  Past Medical History:  Diagnosis Date  . Aortic valve stenosis   . Arthritis   . Cancer [facility])    breast and colon  . Cancer ([place])   . Cataract   . Cataracts, both eyes   . Hypertension   . Plantar keratosis   . Scoliosis    Past Surgical History:  Procedure Laterality Date  . BACK SURGERY    . BREAST SURGERY     breast cancer  . COLON SURGERY     colon cancer  . COLON SURGERY    . LUMBAR  LAMINECTOMY    . meniscus tear    . TOE AMPUTATION     second toe    Allergies  Allergen Reactions  . Cephalosporins Anaphylaxis  . Ciprofloxacin Other (See Comments)    Son reports seizure-like activity  . Lisinopril Other (See Comments)    UNKNOWN REACTION on MAR  . Other Swelling    Walnuts causes lips swelling and ALL NUTS, per [facility] ANESTHESIA (as noted on MAR)  . Pyridium [Phenazopyridine Hcl] Other (See Comments)    UNKNOWN  . Aleve [Naproxen] Rash  . Iodine Itching and Rash  . Macrobid [Nitrofurantoin Monohyd  Macro] Swelling and Rash  . Penicillins Hives and Rash    Has patient had a PCN reaction causing immediate rash, facial/tongue/throat swelling, SOB or lightheadedness with hypotension: Yes Has patient had a PCN reaction causing severe rash involving mucus membranes or skin necrosis: Unknown Has patient had a PCN reaction that required hospitalization: Unknown Has patient had a PCN reaction occurring within the last 10 years: No If all of the above answers are "NO", then may proceed with Cephalosporin use.   . Shellfish Allergy Itching and Rash      Medication List       Accurate as of 07/25/16 12:01 PM. Always use your most recent med list.          acetaminophen 650 MG CR tablet Commonly known as:  TYLENOL Take 650 mg by mouth 3 (three) times daily.   ARTIFICIAL TEARS 0.4 % Soln Generic drug:  Hypromellose Apply 2 drops to eye 3 (three) times daily.   aspirin 81 MG tablet Take 81 mg by mouth daily with breakfast.   BIOTENE DRY MOUTH MOISTURIZING Soln See admin instructions. 1-2 sprays into the mouth three times a day   REFRESH OP Apply 2 drops to eye at bedtime as needed (for dry eyes).   saccharomyces boulardii 250 MG capsule Commonly known as:  FLORASTOR Take 250 mg by mouth 2 (two) times daily.       Review of Systems  Constitutional: Positive for activity change, appetite change, fatigue and unexpected weight change. Negative for chills and fever.  HENT: Negative for congestion.   Eyes: Negative for visual disturbance.  Respiratory: Negative for chest tightness and shortness of breath.   Cardiovascular: Negative for leg swelling.  Gastrointestinal: Negative for abdominal distention.  Genitourinary:       Foley draining dark yellow urine  Musculoskeletal: Positive for arthralgias, back pain, gait problem, myalgias and neck pain.  Skin: Positive for pallor.  Neurological: Positive for speech difficulty. Negative for dizziness, weakness and headaches.    Psychiatric/Behavioral: Positive for confusion. Negative for agitation.    Immunization History  Administered Date(s) Administered  . PPD Test 10/20/2014   Pertinent  Health Maintenance Due  Topic Date Due  . DEXA SCAN  10/31/1989  . PNA vac Low Risk Adult (1 of 2 - PCV13) 10/31/1989  . INFLUENZA VACCINE  05/02/2016   No flowsheet data found. Functional Status Survey:    Vitals:   07/25/16 1154  BP: 139/72  Pulse: 98  Resp: 18  Temp: 97 F (36.1 C)  TempSrc: Oral  SpO2: 94%  Weight: 118 lb (53.5 kg)  Height: 5\' 3"  (1.6 m)   Body mass index is 20.9 kg/m. Physical Exam  Constitutional: No distress.  Cardiovascular: Normal heart sounds.   tachy  Pulmonary/Chest: Effort normal and breath sounds normal. No respiratory distress. She has no wheezes. She has no rales.  Abdominal: Soft.  Bowel sounds are normal. She exhibits no distension. There is no tenderness.  Genitourinary:  Genitourinary Comments: Foley draining dark yellow urine  Musculoskeletal: Normal range of motion.  Neurological: She is alert.  Speech trails off, some aphasia, some use of incorrect words also  Skin: Skin is warm and dry. There is pallor.  Psychiatric: She has a normal mood and affect.    Labs reviewed:  Recent Labs  07/05/16 0442  07/09/16 1502 07/10/16 0534 07/11/16 0601 07/13/16 1003 07/24/16 0400  NA 136  < > 151* 152* 144 139 138  K 2.9*  < > 3.6 3.5 3.1* 3.8 3.4  CL 102  < > 119* 113* 107  --   --   CO2 24  < > 29 32 29  --   --   GLUCOSE 79  < > 108* 97 108*  --   --   BUN 33*  < > 24* 23* 21* 14 12  CREATININE 0.65  < > 0.39* 0.45 0.36* 0.3* 0.2*  CALCIUM 9.0  < > 9.3 9.2 8.9  --   --   MG 2.0  --   --   --   --   --   --   < > = values in this interval not displayed.  Recent Labs  07/02/16 2010 07/07/16 1502 07/08/16 0446  AST 23 46* 34  ALT 17 38 31  ALKPHOS 42 50 40  BILITOT 0.6 1.2 1.0  PROT 6.2* 6.7 5.6*  ALBUMIN 3.5 3.6 2.9*    Recent Labs   07/07/16 1502 07/08/16 0446 07/09/16 0514 07/10/16 0534 07/13/16 1003 07/24/16 0400  WBC 15.1* 12.3* 11.0* 12.5* 10.3 11.0  NEUTROABS 11.5*  --  7.6 9.2*  --   --   HGB 12.7 10.8* 10.0* 10.6* 10.0* 9.1*  HCT 40.2 35.3* 33.2* 35.0* 30* 29*  MCV 91.6 92.2 92.7 93.6  --   --   PLT 314 233 160 134* 188 428*   Lab Results  Component Value Date   TSH 4.127 07/02/2016   Lab Results  Component Value Date   HGBA1C 5.5 06/25/2016   Lab Results  Component Value Date   CHOL 124 06/25/2016   HDL 76 06/25/2016   LDLCALC 33 06/25/2016   TRIG 75 06/25/2016   CHOLHDL 1.6 06/25/2016    Significant Diagnostic Results in last 30 days:  Dg Chest 2 View  Result Date: 07/02/2016 CLINICAL DATA:  Fall 2 days ago with chest pain, initial encounter EXAM: CHEST  2 VIEW COMPARISON:  05/17/2016 FINDINGS: Cardiac shadow is stable. Aortic calcifications are again seen. Elevation the right hemidiaphragm is noted. Some mild interstitial changes are again seen and stable. No pneumothorax or focal infiltrate is seen. Bony structures show no acute abnormality. IMPRESSION: No active cardiopulmonary disease. Electronically Signed   By: Inez Catalina M.D.   On: 07/02/2016 19:57   Ct Head Wo Contrast  Result Date: 07/07/2016 CLINICAL DATA:  Altered mental status. Patient unresponsive. Hypoxia. Recent fall. EXAM: CT HEAD WITHOUT CONTRAST TECHNIQUE: Contiguous axial images were obtained from the base of the skull through the vertex without intravenous contrast. COMPARISON:  CT head 07/02/2016.  Initial CT head 06/30/2016. FINDINGS: Brain: Global atrophy. Small vessel disease. No hydrocephalus or intra-axial mass lesion. No features are seen to confirm cerebral infarction or anoxic injury. Increasing BILATERAL extra-axial CSF like fluid collections consistent with hygromas. 13 mm thick on the RIGHT and 12 mm thick on the LEFT, significant progression from most recent study  5 days ago. Sulcal flattening is present, without  herniation. Vascular: No hyperdense vessel or unexpected calcification. Skull: Normal. Negative for fracture or focal lesion. RIGHT frontal scalp hematoma redemonstrated. Sinuses/Orbits: No acute finding. Other: None. IMPRESSION: Significant increase in BILATERAL subdural hygromas since 06/30/16 and 07/02/2016. LEFT untreated, increased intracranial pressure or herniation could occur. Depending on the patient's clinical condition, neurosurgical consultation may be warranted. These results were called by telephone at the time of interpretation on 07/07/2016 at 5:29 pm to Dr. Jola Schmidt , who verbally acknowledged these results. Electronically Signed   By: Staci Righter M.D.   On: 07/07/2016 17:31   Ct Head Wo Contrast  Result Date: 07/02/2016 CLINICAL DATA:  Right-sided bruising after falling 2 days ago. Slurred speech. Generalized weakness. EXAM: CT HEAD WITHOUT CONTRAST TECHNIQUE: Contiguous axial images were obtained from the base of the skull through the vertex without intravenous contrast. COMPARISON:  Head CT 06/30/2016 and 06/24/2016. MRI brain 06/25/2016. FINDINGS: Despite efforts by the technologist and patient, motion artifact is present on today's exam and could not be eliminated. This reduces exam sensitivity and specificity. Brain: There is no evidence of acute intracranial hemorrhage, mass lesion, brain edema or new extra-axial fluid collection. There is stable diffuse prominence of the subarachnoid spaces, especially over the frontal lobes. No hydrocephalus. There is no CT evidence of acute cortical infarction. Stable chronic small vessel ischemic changes in the periventricular white matter. Vascular: Extensive intracranial vascular calcifications are again noted. Skull: Negative for fracture or focal lesion. Sinuses/Orbits: Periorbital swelling on the right appears improved. The visualized paranasal sinuses, mastoid air cells and middle ears are clear. Other: None. IMPRESSION: 1. Interval  improvement in periorbital swelling/hematoma on the right. 2. No acute intracranial findings. 3. Stable atrophy and chronic small vessel ischemic changes. Electronically Signed   By: Richardean Sale M.D.   On: 07/02/2016 20:29   Ct Head Wo Contrast  Result Date: 06/30/2016 CLINICAL DATA:  Status post fall today while walking. Hematoma above the right eye. Initial encounter. EXAM: CT HEAD WITHOUT CONTRAST CT MAXILLOFACIAL WITHOUT CONTRAST CT CERVICAL SPINE WITHOUT CONTRAST TECHNIQUE: Multidetector CT imaging of the head, cervical spine, and maxillofacial structures were performed using the standard protocol without intravenous contrast. Multiplanar CT image reconstructions of the cervical spine and maxillofacial structures were also generated. COMPARISON:  CT head CT scan 06/24/2016.  Brain MRI 06/25/2016. FINDINGS: CT HEAD FINDINGS Brain: There is cortical atrophy and chronic microvascular ischemic change. No evidence of acute intracranial abnormality including hemorrhage, infarct, mass lesion, mass effect, midline shift or abnormal extra-axial fluid collection. No hydrocephalus or pneumocephalus. Vascular: Extensive atherosclerosis is seen. Skull: Intact. Sinuses/Orbits: See below. Other: None. CT MAXILLOFACIAL FINDINGS Osseous: No fracture. Mandibular condyles are located with marked degenerative change seen bilaterally. Orbits: Unremarkable. Sinuses: Clear. Soft tissues: Hematoma is seen about the right eye. Limited intracranial: See above. CT CERVICAL SPINE FINDINGS Alignment: 0.3 cm anterolisthesis C3 on C4 and C4 on C5 due to facet arthropathy is identified. There is also trace anterolisthesis C7 on T1. Skull base and vertebrae: No fracture Soft tissues and spinal canal: No epidural hematoma is identified. Carotid atherosclerosis is noted. Disc levels: There is marked loss of disc space height at C5-6 and C6-7. Milder degree of degenerative disc disease is noted at C4-5. Upper chest: Single small calcified  pleural plaque in the right apex is noted. Otherwise unremarkable. Other: None. IMPRESSION: Contusion about the right eye. No other acute abnormality head, face or cervical spine. Extensive atrophy and chronic microvascular  ischemic change. Multilevel cervical spondylosis. Atherosclerosis. Electronically Signed   By: Inge Rise M.D.   On: 06/30/2016 16:08   Ct Cervical Spine Wo Contrast  Result Date: 06/30/2016 CLINICAL DATA:  Status post fall today while walking. Hematoma above the right eye. Initial encounter. EXAM: CT HEAD WITHOUT CONTRAST CT MAXILLOFACIAL WITHOUT CONTRAST CT CERVICAL SPINE WITHOUT CONTRAST TECHNIQUE: Multidetector CT imaging of the head, cervical spine, and maxillofacial structures were performed using the standard protocol without intravenous contrast. Multiplanar CT image reconstructions of the cervical spine and maxillofacial structures were also generated. COMPARISON:  CT head CT scan 06/24/2016.  Brain MRI 06/25/2016. FINDINGS: CT HEAD FINDINGS Brain: There is cortical atrophy and chronic microvascular ischemic change. No evidence of acute intracranial abnormality including hemorrhage, infarct, mass lesion, mass effect, midline shift or abnormal extra-axial fluid collection. No hydrocephalus or pneumocephalus. Vascular: Extensive atherosclerosis is seen. Skull: Intact. Sinuses/Orbits: See below. Other: None. CT MAXILLOFACIAL FINDINGS Osseous: No fracture. Mandibular condyles are located with marked degenerative change seen bilaterally. Orbits: Unremarkable. Sinuses: Clear. Soft tissues: Hematoma is seen about the right eye. Limited intracranial: See above. CT CERVICAL SPINE FINDINGS Alignment: 0.3 cm anterolisthesis C3 on C4 and C4 on C5 due to facet arthropathy is identified. There is also trace anterolisthesis C7 on T1. Skull base and vertebrae: No fracture Soft tissues and spinal canal: No epidural hematoma is identified. Carotid atherosclerosis is noted. Disc levels: There is  marked loss of disc space height at C5-6 and C6-7. Milder degree of degenerative disc disease is noted at C4-5. Upper chest: Single small calcified pleural plaque in the right apex is noted. Otherwise unremarkable. Other: None. IMPRESSION: Contusion about the right eye. No other acute abnormality head, face or cervical spine. Extensive atrophy and chronic microvascular ischemic change. Multilevel cervical spondylosis. Atherosclerosis. Electronically Signed   By: Inge Rise M.D.   On: 06/30/2016 16:08   Mr Jodene Nam Head Wo Contrast  Result Date: 06/25/2016 CLINICAL DATA:  Acute onset confusion and left hemianopia. EXAM: MRI HEAD WITHOUT CONTRAST MRA HEAD WITHOUT CONTRAST MRA NECK WITHOUT AND WITH CONTRAST TECHNIQUE: Multiplanar, multiecho pulse sequences of the brain and surrounding structures were obtained without intravenous contrast. Angiographic images of the Circle of Willis were obtained using MRA technique without intravenous contrast. Angiographic images of the neck were obtained using MRA technique without and with intravenous contrast. Carotid stenosis measurements (when applicable) are obtained utilizing NASCET criteria, using the distal internal carotid diameter as the denominator. CONTRAST:  12mL MULTIHANCE GADOBENATE DIMEGLUMINE 529 MG/ML IV SOLN COMPARISON:  Head CT 06/24/2016 and MRI 08/19/2008 FINDINGS: MRI HEAD FINDINGS Brain: There is no evidence of acute infarct, intracranial hemorrhage, mass, midline shift, or extra-axial fluid collection. There is moderate cerebral atrophy, mildly progressed from the prior MRI. Patchy T2 hyperintensities in the subcortical and deep cerebral white matter have also progressed and are nonspecific but compatible with moderate chronic small vessel ischemic disease. There is a small chronic right which is new from the prior MRI cerebellar infarct. Chronic lacunar infarcts in the corpus callosum are unchanged. Vascular: Major intracranial vascular flow voids are  preserved. Skull and upper cervical spine: No suspicious focal osseous lesion. Partially visualized cervical disc degeneration, advanced at C5-6. Grade 1 anterolisthesis of C3 on C4 and C4 on C5. Sinuses/Orbits: Prior bilateral cataract extraction. Trace left sphenoid sinus mucosal thickening. Clear mastoid air cells. Other: None. MRA HEAD FINDINGS The visualized distal vertebral arteries are widely patent with the left being slightly dominant. Left PICA origin is patent. Right PICA  is not identified. AICA and SCA origins are patent. Basilar artery is widely patent. There is a patent left posterior communicating artery with mildly hypoplastic left P1 segment. No significant proximal PCA stenosis is seen. The internal carotid arteries are widely patent from skullbase to carotid termini. There is mild cavernous segment irregularity bilaterally. ACAs and MCAs are patent without evidence of major branch occlusion or significant proximal stenosis. No intracranial aneurysm is identified. MRA NECK FINDINGS Standard aortic arch configuration. Brachiocephalic and subclavian arteries appear widely patent. Eccentric plaque at the carotid bifurcation on the right results in approximately 50% distal common carotid artery stenosis as well as moderate to severe proximal ECA stenosis. The right ICA is patent and tortuous proximally without significant stenosis. The left common carotid artery is widely patent. The left cervical ICA is patent and tortuous in its proximal to midportion with focal kinking. There is moderate to severe left ECA origin stenosis. The vertebral arteries are patent with antegrade flow bilaterally and with the left being mildly dominant. No significant vertebral artery stenosis is identified, although both V3 segments demonstrated a mildly beaded appearance. IMPRESSION: 1. No acute intracranial abnormality. 2. Moderate chronic small vessel ischemic disease with chronic lacunar infarcts as above, progressed  from 2009. 3. No major intracranial arterial occlusion or significant stenosis. 4. 50% distal right common carotid artery stenosis. 5. Tortuous left cervical ICA with focal kinking. 6. Moderate to severe proximal external carotid artery stenosis bilaterally. 7. Suspected mild fibromuscular dysplasia in the vertebral arteries. Electronically Signed   By: Logan Bores M.D.   On: 06/25/2016 16:37   Mr Angiogram Neck W Wo Contrast  Result Date: 06/25/2016 CLINICAL DATA:  Acute onset confusion and left hemianopia. EXAM: MRI HEAD WITHOUT CONTRAST MRA HEAD WITHOUT CONTRAST MRA NECK WITHOUT AND WITH CONTRAST TECHNIQUE: Multiplanar, multiecho pulse sequences of the brain and surrounding structures were obtained without intravenous contrast. Angiographic images of the Circle of Willis were obtained using MRA technique without intravenous contrast. Angiographic images of the neck were obtained using MRA technique without and with intravenous contrast. Carotid stenosis measurements (when applicable) are obtained utilizing NASCET criteria, using the distal internal carotid diameter as the denominator. CONTRAST:  60mL MULTIHANCE GADOBENATE DIMEGLUMINE 529 MG/ML IV SOLN COMPARISON:  Head CT 06/24/2016 and MRI 08/19/2008 FINDINGS: MRI HEAD FINDINGS Brain: There is no evidence of acute infarct, intracranial hemorrhage, mass, midline shift, or extra-axial fluid collection. There is moderate cerebral atrophy, mildly progressed from the prior MRI. Patchy T2 hyperintensities in the subcortical and deep cerebral white matter have also progressed and are nonspecific but compatible with moderate chronic small vessel ischemic disease. There is a small chronic right which is new from the prior MRI cerebellar infarct. Chronic lacunar infarcts in the corpus callosum are unchanged. Vascular: Major intracranial vascular flow voids are preserved. Skull and upper cervical spine: No suspicious focal osseous lesion. Partially visualized cervical  disc degeneration, advanced at C5-6. Grade 1 anterolisthesis of C3 on C4 and C4 on C5. Sinuses/Orbits: Prior bilateral cataract extraction. Trace left sphenoid sinus mucosal thickening. Clear mastoid air cells. Other: None. MRA HEAD FINDINGS The visualized distal vertebral arteries are widely patent with the left being slightly dominant. Left PICA origin is patent. Right PICA is not identified. AICA and SCA origins are patent. Basilar artery is widely patent. There is a patent left posterior communicating artery with mildly hypoplastic left P1 segment. No significant proximal PCA stenosis is seen. The internal carotid arteries are widely patent from skullbase to carotid termini. There  is mild cavernous segment irregularity bilaterally. ACAs and MCAs are patent without evidence of major branch occlusion or significant proximal stenosis. No intracranial aneurysm is identified. MRA NECK FINDINGS Standard aortic arch configuration. Brachiocephalic and subclavian arteries appear widely patent. Eccentric plaque at the carotid bifurcation on the right results in approximately 50% distal common carotid artery stenosis as well as moderate to severe proximal ECA stenosis. The right ICA is patent and tortuous proximally without significant stenosis. The left common carotid artery is widely patent. The left cervical ICA is patent and tortuous in its proximal to midportion with focal kinking. There is moderate to severe left ECA origin stenosis. The vertebral arteries are patent with antegrade flow bilaterally and with the left being mildly dominant. No significant vertebral artery stenosis is identified, although both V3 segments demonstrated a mildly beaded appearance. IMPRESSION: 1. No acute intracranial abnormality. 2. Moderate chronic small vessel ischemic disease with chronic lacunar infarcts as above, progressed from 2009. 3. No major intracranial arterial occlusion or significant stenosis. 4. 50% distal right common  carotid artery stenosis. 5. Tortuous left cervical ICA with focal kinking. 6. Moderate to severe proximal external carotid artery stenosis bilaterally. 7. Suspected mild fibromuscular dysplasia in the vertebral arteries. Electronically Signed   By: Logan Bores M.D.   On: 06/25/2016 16:37   Mr Brain Wo Contrast  Result Date: 06/25/2016 CLINICAL DATA:  Acute onset confusion and left hemianopia. EXAM: MRI HEAD WITHOUT CONTRAST MRA HEAD WITHOUT CONTRAST MRA NECK WITHOUT AND WITH CONTRAST TECHNIQUE: Multiplanar, multiecho pulse sequences of the brain and surrounding structures were obtained without intravenous contrast. Angiographic images of the Circle of Willis were obtained using MRA technique without intravenous contrast. Angiographic images of the neck were obtained using MRA technique without and with intravenous contrast. Carotid stenosis measurements (when applicable) are obtained utilizing NASCET criteria, using the distal internal carotid diameter as the denominator. CONTRAST:  40mL MULTIHANCE GADOBENATE DIMEGLUMINE 529 MG/ML IV SOLN COMPARISON:  Head CT 06/24/2016 and MRI 08/19/2008 FINDINGS: MRI HEAD FINDINGS Brain: There is no evidence of acute infarct, intracranial hemorrhage, mass, midline shift, or extra-axial fluid collection. There is moderate cerebral atrophy, mildly progressed from the prior MRI. Patchy T2 hyperintensities in the subcortical and deep cerebral white matter have also progressed and are nonspecific but compatible with moderate chronic small vessel ischemic disease. There is a small chronic right which is new from the prior MRI cerebellar infarct. Chronic lacunar infarcts in the corpus callosum are unchanged. Vascular: Major intracranial vascular flow voids are preserved. Skull and upper cervical spine: No suspicious focal osseous lesion. Partially visualized cervical disc degeneration, advanced at C5-6. Grade 1 anterolisthesis of C3 on C4 and C4 on C5. Sinuses/Orbits: Prior  bilateral cataract extraction. Trace left sphenoid sinus mucosal thickening. Clear mastoid air cells. Other: None. MRA HEAD FINDINGS The visualized distal vertebral arteries are widely patent with the left being slightly dominant. Left PICA origin is patent. Right PICA is not identified. AICA and SCA origins are patent. Basilar artery is widely patent. There is a patent left posterior communicating artery with mildly hypoplastic left P1 segment. No significant proximal PCA stenosis is seen. The internal carotid arteries are widely patent from skullbase to carotid termini. There is mild cavernous segment irregularity bilaterally. ACAs and MCAs are patent without evidence of major branch occlusion or significant proximal stenosis. No intracranial aneurysm is identified. MRA NECK FINDINGS Standard aortic arch configuration. Brachiocephalic and subclavian arteries appear widely patent. Eccentric plaque at the carotid bifurcation on the right results  in approximately 50% distal common carotid artery stenosis as well as moderate to severe proximal ECA stenosis. The right ICA is patent and tortuous proximally without significant stenosis. The left common carotid artery is widely patent. The left cervical ICA is patent and tortuous in its proximal to midportion with focal kinking. There is moderate to severe left ECA origin stenosis. The vertebral arteries are patent with antegrade flow bilaterally and with the left being mildly dominant. No significant vertebral artery stenosis is identified, although both V3 segments demonstrated a mildly beaded appearance. IMPRESSION: 1. No acute intracranial abnormality. 2. Moderate chronic small vessel ischemic disease with chronic lacunar infarcts as above, progressed from 2009. 3. No major intracranial arterial occlusion or significant stenosis. 4. 50% distal right common carotid artery stenosis. 5. Tortuous left cervical ICA with focal kinking. 6. Moderate to severe proximal  external carotid artery stenosis bilaterally. 7. Suspected mild fibromuscular dysplasia in the vertebral arteries. Electronically Signed   By: Logan Bores M.D.   On: 06/25/2016 16:37   Dg Chest Port 1 View  Result Date: 07/10/2016 CLINICAL DATA:  Shortness breath. EXAM: PORTABLE CHEST 1 VIEW COMPARISON:  07/07/2016.  07/02/2016. FINDINGS: Stable appearance marked asymmetric elevation right hemidiaphragm. No evidence for airspace pulmonary edema or focal lung consolidation. Scarring or chronic atelectasis again noted left lung base. Cardiopericardial silhouette is at upper limits of normal for size. Bones are diffusely demineralized. IMPRESSION: Stable.  No new or progressive interval findings. Electronically Signed   By: Misty Stanley M.D.   On: 07/10/2016 10:16   Dg Chest Portable 1 View  Result Date: 07/07/2016 CLINICAL DATA:  Shortness of breath. EXAM: PORTABLE CHEST 1 VIEW COMPARISON:  07/02/2016 FINDINGS: Chronic elevation of the right hemidiaphragm. Heart and mediastinum are stable. Aortic arch has atherosclerotic calcifications. Surgical clips in the right axilla. Subtle interstitial densities throughout both lungs could represent mild edema versus chronic change. Negative for a pneumothorax. IMPRESSION: Question mild interstitial edema. Stable elevation of the right hemidiaphragm. Aortic atherosclerosis. Electronically Signed   By: Markus Daft M.D.   On: 07/07/2016 15:57   Dg Swallowing Func-speech Pathology  Result Date: 07/10/2016 Objective Swallowing Evaluation: Type of Study: MBS-Modified Barium Swallow Study Patient Details Name: Whitney Marquez MRN: [id number] Date of Birth: Feb 07, 1923 Today's Date: 07/10/2016 Time: SLP Start Time (ACUTE ONLY): 1330-SLP Stop Time (ACUTE ONLY): 1355 SLP Time Calculation (min) (ACUTE ONLY): 25 min Past Medical History: Past Medical History: Diagnosis Date . Aortic valve stenosis  . Arthritis  . Cancer [facility])   breast and colon . Cancer ([place])  . Cataract  .  Cataracts, both eyes  . Hypertension  . Plantar keratosis  . Scoliosis  Past Surgical History: Past Surgical History: Procedure Laterality Date . BACK SURGERY   . BREAST SURGERY    breast cancer . COLON SURGERY    colon cancer . COLON SURGERY   . LUMBAR LAMINECTOMY   . meniscus tear   . TOE AMPUTATION    second toe HPI: Whitney B Holmesis a 82 y.o.woman with a history of aortic stenosis, HTN, HLD, and breast and colon cancers who up until recently, was a resident in an independent living facility. She was admitted to the hospital 9/23-9/25 after  with a UTI. She was discharged on full strength Bactrim. She was back in the ED on 9/29 after having a fall that resulted in a laceration over her right eye and extensive bruising involving most of her right face and neck. She was diagnosed with UTI again  and was discharged to home with antibiotics. She was re-admitted 10/2 with acute metabolic encephalopathy due to polypharmacy, recent UTI. Pt seen by SLP, determined to have significant dysphagia, but family wished to d/c quite early to return to familiar environment. Readmitted 10/6 for AMS and dehydration. Palliative care consulted to aid in discussion of end of life given progressive decline.  No Data Recorded Assessment / Plan / Recommendation CHL IP CLINICAL IMPRESSIONS 07/10/2016 Therapy Diagnosis Moderate oral phase dysphagia;Moderate pharyngeal phase dysphagia;Severe pharyngeal phase dysphagia Clinical Impression Pt demonstrates a moderate to severe dysphagia due to generalized weakness and severely dry oral and base of tongue mucosa. Lingual propulsion of boluses was initially weak with liquids falling uncontrolled to oropharynx, though this improved as session progressed. Pt eventually able to masticate and form solid bolus. Hyolaryngeal mobility and base of tongue are weak and vallecular residuals are severe, likely also due to dry mucosa.  A chin tuck with straw sips dramatically improved propulsion of  bolus through the pharynx and reduced residuals and penetration with nectar thick liquids. Recommend a dys 1 (puree) diet and nectar thick liquids with full supervision for a chin tuck and double swallows, intermittent throat clearing encouraged. Aspiration risk is still high though hopeful for improvement with regular intake. Pts daughter present for education. Will f/u for tolerance.  Impact on safety and function Moderate aspiration risk   CHL IP TREATMENT RECOMMENDATION 07/10/2016 Treatment Recommendations Therapy as outlined in treatment plan below   Prognosis 07/10/2016 Prognosis for Safe Diet Advancement Fair Barriers to Reach Goals Cognitive deficits Barriers/Prognosis Comment -- CHL IP DIET RECOMMENDATION 07/10/2016 SLP Diet Recommendations Dysphagia 1 (Puree) solids;Nectar thick liquid Liquid Administration via Cup;Straw Medication Administration Crushed with puree Compensations Slow rate;Small sips/bites;Chin tuck;Multiple dry swallows after each bite/sip;Clear throat intermittently Postural Changes Remain semi-upright after after feeds/meals (Comment);Seated upright at 90 degrees   CHL IP OTHER RECOMMENDATIONS 07/10/2016 Recommended Consults -- Oral Care Recommendations Oral care BID Other Recommendations Have oral suction available;Order thickener from pharmacy   CHL IP FOLLOW UP RECOMMENDATIONS 07/10/2016 Follow up Recommendations Skilled Nursing facility   [facility] IP FREQUENCY AND DURATION 07/10/2016 Speech Therapy Frequency (ACUTE ONLY) min 2x/week Treatment Duration 2 weeks      CHL IP ORAL PHASE 07/10/2016 Oral Phase Impaired Oral - Pudding Teaspoon -- Oral - Pudding Cup -- Oral - Honey Teaspoon -- Oral - Honey Cup -- Oral - Nectar Teaspoon Lingual/palatal residue;Weak lingual manipulation;Reduced posterior propulsion;Decreased bolus cohesion;Delayed oral transit Oral - Nectar Cup Lingual/palatal residue;Weak lingual manipulation;Reduced posterior propulsion;Decreased bolus cohesion;Delayed oral transit Oral  - Nectar Straw Lingual/palatal residue;Weak lingual manipulation;Reduced posterior propulsion;Decreased bolus cohesion;Delayed oral transit Oral - Thin Teaspoon -- Oral - Thin Cup Lingual/palatal residue;Weak lingual manipulation;Reduced posterior propulsion;Decreased bolus cohesion;Delayed oral transit Oral - Thin Straw -- Oral - Puree -- Oral - Mech Soft Lingual/palatal residue;Weak lingual manipulation;Reduced posterior propulsion;Delayed oral transit Oral - Regular -- Oral - Multi-Consistency -- Oral - Pill Lingual/palatal residue;Weak lingual manipulation;Reduced posterior propulsion;Decreased bolus cohesion;Delayed oral transit Oral Phase - Comment --  CHL IP PHARYNGEAL PHASE 07/10/2016 Pharyngeal Phase Impaired Pharyngeal- Pudding Teaspoon -- Pharyngeal -- Pharyngeal- Pudding Cup -- Pharyngeal -- Pharyngeal- Honey Teaspoon -- Pharyngeal -- Pharyngeal- Honey Cup -- Pharyngeal -- Pharyngeal- Nectar Teaspoon Reduced tongue base retraction;Reduced laryngeal elevation;Reduced epiglottic inversion;Reduced pharyngeal peristalsis;Reduced anterior laryngeal mobility;Pharyngeal residue - valleculae;Compensatory strategies attempted (with notebox);Penetration/Apiration after swallow;Penetration/Aspiration during swallow Pharyngeal Material enters airway, remains ABOVE vocal cords and not ejected out Pharyngeal- Nectar Cup Reduced tongue base retraction;Reduced laryngeal elevation;Reduced epiglottic inversion;Reduced  pharyngeal peristalsis;Reduced anterior laryngeal mobility;Pharyngeal residue - valleculae;Compensatory strategies attempted (with notebox);Penetration/Apiration after swallow;Penetration/Aspiration during swallow Pharyngeal Material does not enter airway;Material enters airway, remains ABOVE vocal cords and not ejected out Pharyngeal- Nectar Straw Reduced tongue base retraction;Reduced laryngeal elevation;Reduced epiglottic inversion;Reduced pharyngeal peristalsis;Reduced anterior laryngeal  mobility;Pharyngeal residue - valleculae;Compensatory strategies attempted (with notebox);Penetration/Apiration after swallow;Penetration/Aspiration during swallow Pharyngeal Material does not enter airway Pharyngeal- Thin Teaspoon Reduced tongue base retraction;Reduced laryngeal elevation;Reduced epiglottic inversion;Reduced pharyngeal peristalsis;Reduced anterior laryngeal mobility;Pharyngeal residue - valleculae;Compensatory strategies attempted (with notebox);Penetration/Apiration after swallow;Penetration/Aspiration during swallow Pharyngeal Material enters airway, remains ABOVE vocal cords and not ejected out Pharyngeal- Thin Cup Reduced tongue base retraction;Reduced laryngeal elevation;Reduced epiglottic inversion;Reduced pharyngeal peristalsis;Reduced anterior laryngeal mobility;Pharyngeal residue - valleculae;Compensatory strategies attempted (with notebox);Penetration/Apiration after swallow;Penetration/Aspiration during swallow Pharyngeal Material enters airway, remains ABOVE vocal cords and not ejected out Pharyngeal- Thin Straw Reduced tongue base retraction;Reduced laryngeal elevation;Reduced epiglottic inversion;Reduced pharyngeal peristalsis;Reduced anterior laryngeal mobility;Pharyngeal residue - valleculae;Compensatory strategies attempted (with notebox);Penetration/Apiration after swallow;Penetration/Aspiration during swallow;Moderate aspiration Pharyngeal Material enters airway, passes BELOW cords without attempt by patient to eject out (silent aspiration) Pharyngeal- Puree Reduced tongue base retraction;Reduced laryngeal elevation;Reduced epiglottic inversion;Reduced pharyngeal peristalsis;Reduced anterior laryngeal mobility;Pharyngeal residue - valleculae;Compensatory strategies attempted (with notebox) Pharyngeal Material does not enter airway Pharyngeal- Mechanical Soft -- Pharyngeal -- Pharyngeal- Regular Reduced tongue base retraction;Reduced laryngeal elevation;Reduced epiglottic  inversion;Reduced pharyngeal peristalsis;Reduced anterior laryngeal mobility;Pharyngeal residue - valleculae;Compensatory strategies attempted (with notebox) Pharyngeal -- Pharyngeal- Multi-consistency -- Pharyngeal -- Pharyngeal- Pill -- Pharyngeal -- Pharyngeal Comment --  No flowsheet data found. No flowsheet data found. Herbie [place], [place] CCC-SLP [id number] [place], Katherene Ponto 07/10/2016, 3:24 PM              Mr Brain Ltd W/o Cm  Result Date: 07/03/2016 CLINICAL DATA:  82 y/o F; slurred speech, a aphasia, and right leg weakness. EXAM: MRI HEAD WITHOUT CONTRAST TECHNIQUE: Sagittal T1, axial DWI, and coronal DWI sequences were acquired. The patient was unable to continue the exam. COMPARISON:  07/02/2016 CT head.  06/25/2016 MR head. FINDINGS: Motion degraded sagittal T1 and axial DWI sequences. No diffusion signal abnormality is identified. Hyperostosis frontalis interna. Moderate parenchymal volume loss. T1 hypo intense foci in subcortical and periventricular white matter as well as within the if corpus callosum are consistent with chronic microvascular ischemic changes and small lacunar infarcts. Mucous thickening within the sphenoid sinus. IMPRESSION: No diffusion signal abnormality to suggest acute/early subacute infarct. Electronically Signed   By: Kristine Garbe M.D.   On: 07/03/2016 00:22   Dg Hip Unilat With Pelvis 2-3 Views Right  Result Date: 06/30/2016 CLINICAL DATA:  Fall with right anterior hip pain. Initial encounter. EXAM: DG HIP (WITH OR WITHOUT PELVIS) 2-3V RIGHT COMPARISON:  None. FINDINGS: No evidence of pelvic ring fracture or diastasis. Both hips are located and appear intact. Osteopenia. Advanced lumbar disc and facet degeneration with partly seen dextroscoliosis. IMPRESSION: No acute finding. Electronically Signed   By: Monte Fantasia M.D.   On: 06/30/2016 16:04   Ct Maxillofacial Wo Cm  Result Date: 06/30/2016 CLINICAL DATA:  Status post fall today while walking.  Hematoma above the right eye. Initial encounter. EXAM: CT HEAD WITHOUT CONTRAST CT MAXILLOFACIAL WITHOUT CONTRAST CT CERVICAL SPINE WITHOUT CONTRAST TECHNIQUE: Multidetector CT imaging of the head, cervical spine, and maxillofacial structures were performed using the standard protocol without intravenous contrast. Multiplanar CT image reconstructions of the cervical spine and maxillofacial structures were also generated. COMPARISON:  CT head CT scan 06/24/2016.  Brain MRI 06/25/2016. FINDINGS:  CT HEAD FINDINGS Brain: There is cortical atrophy and chronic microvascular ischemic change. No evidence of acute intracranial abnormality including hemorrhage, infarct, mass lesion, mass effect, midline shift or abnormal extra-axial fluid collection. No hydrocephalus or pneumocephalus. Vascular: Extensive atherosclerosis is seen. Skull: Intact. Sinuses/Orbits: See below. Other: None. CT MAXILLOFACIAL FINDINGS Osseous: No fracture. Mandibular condyles are located with marked degenerative change seen bilaterally. Orbits: Unremarkable. Sinuses: Clear. Soft tissues: Hematoma is seen about the right eye. Limited intracranial: See above. CT CERVICAL SPINE FINDINGS Alignment: 0.3 cm anterolisthesis C3 on C4 and C4 on C5 due to facet arthropathy is identified. There is also trace anterolisthesis C7 on T1. Skull base and vertebrae: No fracture Soft tissues and spinal canal: No epidural hematoma is identified. Carotid atherosclerosis is noted. Disc levels: There is marked loss of disc space height at C5-6 and C6-7. Milder degree of degenerative disc disease is noted at C4-5. Upper chest: Single small calcified pleural plaque in the right apex is noted. Otherwise unremarkable. Other: None. IMPRESSION: Contusion about the right eye. No other acute abnormality head, face or cervical spine. Extensive atrophy and chronic microvascular ischemic change. Multilevel cervical spondylosis. Atherosclerosis. Electronically Signed   By: Inge Rise M.D.   On: 06/30/2016 16:08    Assessment/Plan 1. Urinary tract infection associated with indwelling urethral catheter, initial encounter ([place]) -will treat with bactrim due to limited choices -cont fluids at family request NS at 75cc/hr daily thru sat when nurse daughter arrives -if pt pulls out, would not restart considering this an act of protest -d/c foley asap after nurse daughter visits also  2. Subdural hygroma -seems that her falls that led to this and her multiple hospitalizations have taken a severe toll on her previously near-normal mentation (there had not been a dementia diagnosis in her chart before all of this and independent living nurse said she was very alert and oriented)  3. Delirium -multifactorial, improved slightly, cont to monitor  4. Cerebrovascular disease -does have underlying cerebrovascular disease, but did not have dementia diagnosis before all of these hospitalizations, falls that I can find  5. Muscle weakness (generalized) -ongoing--needs therapy for this IF she is able to participate which depends on her ability to self-nourish at this point  6. Dehydration -cont fluids, I/O monitoring and pushing po fluids and intake -pt is not alert enough to get adequate intake on her own at this point--unclear if this will happen with her mentation as it is -family is large and two HCPOA twin daughters were given the decision making task, but pt does not have a living will with details laid out and cannot participate well enough to decide for herself right now  Family/ staff Communication: I was present when nurse manager was speaking with her daughter, Whitney Marquez; also spoke with rehab nurse and caregiver in room of pt  Labs/tests ordered:  Has f/u lab bmp already

## 2016-07-31 ENCOUNTER — Encounter: Payer: Self-pay | Admitting: Adult Health

## 2016-07-31 ENCOUNTER — Non-Acute Institutional Stay (SKILLED_NURSING_FACILITY): Payer: Medicare Other | Admitting: Adult Health

## 2016-07-31 DIAGNOSIS — Z4689 Encounter for fitting and adjustment of other specified devices: Secondary | ICD-10-CM | POA: Diagnosis not present

## 2016-07-31 DIAGNOSIS — R34 Anuria and oliguria: Secondary | ICD-10-CM

## 2016-07-31 DIAGNOSIS — R410 Disorientation, unspecified: Secondary | ICD-10-CM | POA: Diagnosis not present

## 2016-07-31 DIAGNOSIS — R5381 Other malaise: Secondary | ICD-10-CM

## 2016-07-31 DIAGNOSIS — T83511D Infection and inflammatory reaction due to indwelling urethral catheter, subsequent encounter: Secondary | ICD-10-CM | POA: Diagnosis not present

## 2016-07-31 DIAGNOSIS — G4701 Insomnia due to medical condition: Secondary | ICD-10-CM

## 2016-07-31 DIAGNOSIS — N390 Urinary tract infection, site not specified: Secondary | ICD-10-CM | POA: Diagnosis not present

## 2016-07-31 DIAGNOSIS — R197 Diarrhea, unspecified: Secondary | ICD-10-CM | POA: Diagnosis not present

## 2016-07-31 DIAGNOSIS — R339 Retention of urine, unspecified: Secondary | ICD-10-CM

## 2016-07-31 NOTE — Progress Notes (Addendum)
Location:  Occupational psychologist of Service:  SNF (31) Provider:   Cindi Carbon, [place] [phone]  Mathews Argyle, MD  Patient Care Team: Lajean Manes, MD as PCP - General (Internal Medicine) Lajean Manes, MD (Internal Medicine)  Extended Emergency Contact Information Primary Emergency Contact: [place] Mobile Phone: [phone] Relation: Daughter Secondary Emergency Contact: Sarracino,Jaskiran  [place] Phone: [phone] Relation: Daughter  Code Status:  DNR Goals of care: Advanced Directive information Advanced Directives 07/31/2016  Does patient have an advance directive? Yes  Type of Advance Directive Out of facility DNR (pink MOST or yellow form);Living will;Healthcare Power of Attorney  Does patient want to make changes to advanced directive? -  Copy of advanced directive(s) in chart? Yes  Pre-existing out of facility DNR order (yellow form or pink MOST form) Yellow form placed in chart (order not valid for inpatient use);Pink MOST form placed in chart (order not valid for inpatient use)     Chief Complaint  Patient presents with  . Acute Visit    diarrhea    HPI:  Pt is a 82 y.o. female seen today for an acute visit for diarrhea. She is currently residing in rehab after 3 ER visits due to delirium.  Her most recent visit was 10/6-10/10 due to mental status changes. She was diagnosed with hypernatremia and dehydration but also subdural hygromas from a fall. She initially did poorly and was requiring IVF due to poor intake and was not performing well with therapy.  Over the past week she has been more alert and participating in therapy.  Drinking more fluids but still with poor food intake. Her weight is down 4 lbs in the past two weeks.  Since 10/22 she has had diarrhea without fever or diarrhea or abd pain.  Hx of ischemic colitis.  Cdiff neg x 2.  Heme pos x 1 but  no obvious bleeding. Hx of colon ca s/p hemicolectomy. Can not relate her symptoms to her diet.  Today she is alert, following commands, answering q's,  Knows the year, and the president.   Reports some back pain at night and takes tylenol 1000 mg TID for this issue.   She has a pessary that has been in 2 years per the family and they are requesting that I remove it. Foley cath in place due to urinary retention and her refusal to use a bed time. No urinary symptoms. Treated for UTI with Doxycycline for >100,000 colonies of Aerococcus viridans on 10/25 for 10 days.  Records indicate her urine output is low 100 cc a shift at times.  Past Medical History:  Diagnosis Date  . Acquired keratosis of plantar aspect of foot 09/15/2013   right foot  . Aortic valve stenosis   . Arthritis   . Bursitis of hip   . Cancer [facility])    breast and colon  . Cancer ([place])   . Cataract   . Cataracts, both eyes   . Colitis 10/15/2014  . Compression fracture of lumbar vertebrae, non-traumatic ([place]) 10/15/2014  . Degenerative disc disease, lumbar   . Hammer toe   . Hypertension   . Metatarsalgia   . Partial thickness macular hole of right eye 10/2007  . Plantar keratosis   . Scoliosis    Past Surgical History:  Procedure Laterality Date  . BACK SURGERY    . BREAST SURGERY     breast cancer  . COLON SURGERY  colon cancer  . COLON SURGERY    . LUMBAR LAMINECTOMY    . meniscus tear    . TOE AMPUTATION     second toe    Allergies  Allergen Reactions  . Cephalosporins Anaphylaxis  . Ciprofloxacin Other (See Comments)    Son reports seizure-like activity  . Lisinopril Other (See Comments)    UNKNOWN REACTION on MAR  . Other Swelling    Walnuts causes lips swelling and ALL NUTS, per [facility] ANESTHESIA (as noted on MAR)  . Pyridium [Phenazopyridine Hcl] Other (See Comments)    UNKNOWN  . Aleve [Naproxen] Rash  . Iodine Itching and Rash  . Macrobid [Nitrofurantoin Monohyd Macro] Swelling and Rash    . Penicillins Hives and Rash    Has patient had a PCN reaction causing immediate rash, facial/tongue/throat swelling, SOB or lightheadedness with hypotension: Yes Has patient had a PCN reaction causing severe rash involving mucus membranes or skin necrosis: Unknown Has patient had a PCN reaction that required hospitalization: Unknown Has patient had a PCN reaction occurring within the last 10 years: No If all of the above answers are "NO", then may proceed with Cephalosporin use.   . Shellfish Allergy Itching and Rash      Medication List       Accurate as of 07/31/16 11:13 AM. Always use your most recent med list.          acetaminophen 500 MG tablet Commonly known as:  TYLENOL Take 1,000 mg by mouth 3 (three) times daily.   ARTIFICIAL TEARS 0.4 % Soln Generic drug:  Hypromellose Apply 2 drops to eye 3 (three) times daily.   aspirin 81 MG tablet Take 81 mg by mouth daily with breakfast.   BIOTENE DRY MOUTH MOISTURIZING Soln Take 2 application by mouth See admin instructions. 1-2 sprays into the mouth 4 times a day   lactose free nutrition Liqd Take 237 mLs by mouth 2 (two) times daily between meals.   REFRESH OP Apply 2 drops to eye at bedtime as needed (for dry eyes).   saccharomyces boulardii 250 MG capsule Commonly known as:  FLORASTOR Take 250 mg by mouth 2 (two) times daily.       Review of Systems  Constitutional: Positive for activity change and appetite change. Negative for chills, diaphoresis, fatigue, fever and unexpected weight change.  HENT: Negative for congestion.   Respiratory: Negative for cough, shortness of breath and wheezing.   Cardiovascular: Negative for chest pain, palpitations and leg swelling.  Gastrointestinal: Positive for diarrhea. Negative for abdominal distention, abdominal pain, constipation, nausea and vomiting.  Genitourinary: Negative for difficulty urinating and dysuria.  Musculoskeletal: Positive for arthralgias, back pain  and gait problem. Negative for joint swelling and myalgias.  Neurological: Negative for dizziness, tremors, seizures, syncope, facial asymmetry, speech difficulty, weakness, light-headedness, numbness and headaches.  Psychiatric/Behavioral: Positive for confusion. Negative for agitation and behavioral problems.    Immunization History  Administered Date(s) Administered  . PPD Test 10/20/2014   Pertinent  Health Maintenance Due  Topic Date Due  . PNA vac Low Risk Adult (1 of 2 - PCV13) 10/31/1989  . INFLUENZA VACCINE  05/02/2016  . DEXA SCAN  Completed   No flowsheet data found. Functional Status Survey:   Marland Kitchen Vitals:   07/31/16 1117  BP: (!) 152/88  Pulse: (!) 104  Resp: (!) 22  Temp: 97.6 F (36.4 C)  SpO2: 93%  Weight: 109 lb (49.4 kg)   Wt Readings from Last 3 Encounters:  07/31/16 109 lb (49.4 kg)  07/25/16 118 lb (53.5 kg)  07/18/16 113 lb (51.3 kg)   Body mass index is 19.31 kg/m. Physical Exam  Constitutional: No distress.  HENT:  Nose: Nose normal.  Mouth/Throat: Oropharynx is clear and moist. No oropharyngeal exudate.  Eyes: Conjunctivae are normal. Pupils are equal, round, and reactive to light. Right eye exhibits no discharge. Left eye exhibits no discharge.  Neck: Neck supple. No JVD present.  Cardiovascular: Regular rhythm.   Murmur heard. Bounding HR 104-108  Pulmonary/Chest: Effort normal and breath sounds normal. No respiratory distress.  Abdominal: Soft. Bowel sounds are normal. She exhibits no distension and no mass. There is no tenderness. There is no rebound and no guarding.  Musculoskeletal: She exhibits no edema or tenderness.  Lymphadenopathy:    She has no cervical adenopathy.  Neurological: She is alert. No cranial nerve deficit. Coordination normal.  Oriented to self, place, situation, not time but did know the year and the president's name  Skin: Skin is warm and dry. She is not diaphoretic.  Psychiatric: She has a normal mood and affect.     Labs reviewed:  Recent Labs  07/05/16 0442  07/09/16 1502 07/10/16 0534 07/11/16 0601 07/13/16 1003 07/24/16 0400  NA 136  < > 151* 152* 144 139 138  K 2.9*  < > 3.6 3.5 3.1* 3.8 3.4  CL 102  < > 119* 113* 107  --   --   CO2 24  < > 29 32 29  --   --   GLUCOSE 79  < > 108* 97 108*  --   --   BUN 33*  < > 24* 23* 21* 14 12  CREATININE 0.65  < > 0.39* 0.45 0.36* 0.3* 0.2*  CALCIUM 9.0  < > 9.3 9.2 8.9  --   --   MG 2.0  --   --   --   --   --   --   < > = values in this interval not displayed.  Recent Labs  07/02/16 2010 07/07/16 1502 07/08/16 0446  AST 23 46* 34  ALT 17 38 31  ALKPHOS 42 50 40  BILITOT 0.6 1.2 1.0  PROT 6.2* 6.7 5.6*  ALBUMIN 3.5 3.6 2.9*    Recent Labs  07/07/16 1502 07/08/16 0446 07/09/16 0514 07/10/16 0534 07/13/16 1003 07/24/16 0400  WBC 15.1* 12.3* 11.0* 12.5* 10.3 11.0  NEUTROABS 11.5*  --  7.6 9.2*  --   --   HGB 12.7 10.8* 10.0* 10.6* 10.0* 9.1*  HCT 40.2 35.3* 33.2* 35.0* 30* 29*  MCV 91.6 92.2 92.7 93.6  --   --   PLT 314 233 160 134* 188 428*   Lab Results  Component Value Date   TSH 4.127 07/02/2016   Lab Results  Component Value Date   HGBA1C 5.5 06/25/2016   Lab Results  Component Value Date   CHOL 124 06/25/2016   HDL 76 06/25/2016   LDLCALC 33 06/25/2016   TRIG 75 06/25/2016   CHOLHDL 1.6 06/25/2016    Significant Diagnostic Results in last 30 days:  Dg Chest 2 View  Result Date: 07/02/2016 CLINICAL DATA:  Fall 2 days ago with chest pain, initial encounter EXAM: CHEST  2 VIEW COMPARISON:  05/17/2016 FINDINGS: Cardiac shadow is stable. Aortic calcifications are again seen. Elevation the right hemidiaphragm is noted. Some mild interstitial changes are again seen and stable. No pneumothorax or focal infiltrate is seen. Bony structures show no acute abnormality.  IMPRESSION: No active cardiopulmonary disease. Electronically Signed   By: Inez Catalina M.D.   On: 07/02/2016 19:57   Ct Head Wo Contrast  Result Date:  07/07/2016 CLINICAL DATA:  Altered mental status. Patient unresponsive. Hypoxia. Recent fall. EXAM: CT HEAD WITHOUT CONTRAST TECHNIQUE: Contiguous axial images were obtained from the base of the skull through the vertex without intravenous contrast. COMPARISON:  CT head 07/02/2016.  Initial CT head 06/30/2016. FINDINGS: Brain: Global atrophy. Small vessel disease. No hydrocephalus or intra-axial mass lesion. No features are seen to confirm cerebral infarction or anoxic injury. Increasing BILATERAL extra-axial CSF like fluid collections consistent with hygromas. 13 mm thick on the RIGHT and 12 mm thick on the LEFT, significant progression from most recent study 5 days ago. Sulcal flattening is present, without herniation. Vascular: No hyperdense vessel or unexpected calcification. Skull: Normal. Negative for fracture or focal lesion. RIGHT frontal scalp hematoma redemonstrated. Sinuses/Orbits: No acute finding. Other: None. IMPRESSION: Significant increase in BILATERAL subdural hygromas since 06/30/16 and 07/02/2016. LEFT untreated, increased intracranial pressure or herniation could occur. Depending on the patient's clinical condition, neurosurgical consultation may be warranted. These results were called by telephone at the time of interpretation on 07/07/2016 at 5:29 pm to Dr. Jola Schmidt , who verbally acknowledged these results. Electronically Signed   By: Staci Righter M.D.   On: 07/07/2016 17:31   Ct Head Wo Contrast  Result Date: 07/02/2016 CLINICAL DATA:  Right-sided bruising after falling 2 days ago. Slurred speech. Generalized weakness. EXAM: CT HEAD WITHOUT CONTRAST TECHNIQUE: Contiguous axial images were obtained from the base of the skull through the vertex without intravenous contrast. COMPARISON:  Head CT 06/30/2016 and 06/24/2016. MRI brain 06/25/2016. FINDINGS: Despite efforts by the technologist and patient, motion artifact is present on today's exam and could not be eliminated. This reduces  exam sensitivity and specificity. Brain: There is no evidence of acute intracranial hemorrhage, mass lesion, brain edema or new extra-axial fluid collection. There is stable diffuse prominence of the subarachnoid spaces, especially over the frontal lobes. No hydrocephalus. There is no CT evidence of acute cortical infarction. Stable chronic small vessel ischemic changes in the periventricular white matter. Vascular: Extensive intracranial vascular calcifications are again noted. Skull: Negative for fracture or focal lesion. Sinuses/Orbits: Periorbital swelling on the right appears improved. The visualized paranasal sinuses, mastoid air cells and middle ears are clear. Other: None. IMPRESSION: 1. Interval improvement in periorbital swelling/hematoma on the right. 2. No acute intracranial findings. 3. Stable atrophy and chronic small vessel ischemic changes. Electronically Signed   By: Richardean Sale M.D.   On: 07/02/2016 20:29   Dg Chest Port 1 View  Result Date: 07/10/2016 CLINICAL DATA:  Shortness breath. EXAM: PORTABLE CHEST 1 VIEW COMPARISON:  07/07/2016.  07/02/2016. FINDINGS: Stable appearance marked asymmetric elevation right hemidiaphragm. No evidence for airspace pulmonary edema or focal lung consolidation. Scarring or chronic atelectasis again noted left lung base. Cardiopericardial silhouette is at upper limits of normal for size. Bones are diffusely demineralized. IMPRESSION: Stable.  No new or progressive interval findings. Electronically Signed   By: Misty Stanley M.D.   On: 07/10/2016 10:16   Dg Chest Portable 1 View  Result Date: 07/07/2016 CLINICAL DATA:  Shortness of breath. EXAM: PORTABLE CHEST 1 VIEW COMPARISON:  07/02/2016 FINDINGS: Chronic elevation of the right hemidiaphragm. Heart and mediastinum are stable. Aortic arch has atherosclerotic calcifications. Surgical clips in the right axilla. Subtle interstitial densities throughout both lungs could represent mild edema versus chronic  change. Negative for  a pneumothorax. IMPRESSION: Question mild interstitial edema. Stable elevation of the right hemidiaphragm. Aortic atherosclerosis. Electronically Signed   By: Markus Daft M.D.   On: 07/07/2016 15:57   Dg Swallowing Func-speech Pathology  Result Date: 07/10/2016 Objective Swallowing Evaluation: Type of Study: MBS-Modified Barium Swallow Study Patient Details Name: HAILEY BIGNER MRN: [id number] Date of Birth: [birth year] Today's Date: 07/10/2016 Time: SLP Start Time (ACUTE ONLY): 1330-SLP Stop Time (ACUTE ONLY): 1355 SLP Time Calculation (min) (ACUTE ONLY): 25 min Past Medical History: Past Medical History: Diagnosis Date . Aortic valve stenosis  . Arthritis  . Cancer [facility])   breast and colon . Cancer ([place])  . Cataract  . Cataracts, both eyes  . Hypertension  . Plantar keratosis  . Scoliosis  Past Surgical History: Past Surgical History: Procedure Laterality Date . BACK SURGERY   . BREAST SURGERY    breast cancer . COLON SURGERY    colon cancer . COLON SURGERY   . LUMBAR LAMINECTOMY   . meniscus tear   . TOE AMPUTATION    second toe HPI: Shakera B Holmesis a 82 y.o.woman with a history of aortic stenosis, HTN, HLD, and breast and colon cancers who up until recently, was a resident in an independent living facility. She was admitted to the hospital 9/23-9/25 after  with a UTI. She was discharged on full strength Bactrim. She was back in the ED on 9/29 after having a fall that resulted in a laceration over her right eye and extensive bruising involving most of her right face and neck. She was diagnosed with UTI again and was discharged to home with antibiotics. She was re-admitted 10/2 with acute metabolic encephalopathy due to polypharmacy, recent UTI. Pt seen by SLP, determined to have significant dysphagia, but family wished to d/c quite early to return to familiar environment. Readmitted 10/6 for AMS and dehydration. Palliative care consulted to aid in discussion of end of life given  progressive decline.  No Data Recorded Assessment / Plan / Recommendation CHL IP CLINICAL IMPRESSIONS 07/10/2016 Therapy Diagnosis Moderate oral phase dysphagia;Moderate pharyngeal phase dysphagia;Severe pharyngeal phase dysphagia Clinical Impression Pt demonstrates a moderate to severe dysphagia due to generalized weakness and severely dry oral and base of tongue mucosa. Lingual propulsion of boluses was initially weak with liquids falling uncontrolled to oropharynx, though this improved as session progressed. Pt eventually able to masticate and form solid bolus. Hyolaryngeal mobility and base of tongue are weak and vallecular residuals are severe, likely also due to dry mucosa.  A chin tuck with straw sips dramatically improved propulsion of bolus through the pharynx and reduced residuals and penetration with nectar thick liquids. Recommend a dys 1 (puree) diet and nectar thick liquids with full supervision for a chin tuck and double swallows, intermittent throat clearing encouraged. Aspiration risk is still high though hopeful for improvement with regular intake. Pts daughter present for education. Will f/u for tolerance.  Impact on safety and function Moderate aspiration risk   CHL IP TREATMENT RECOMMENDATION 07/10/2016 Treatment Recommendations Therapy as outlined in treatment plan below   Prognosis 07/10/2016 Prognosis for Safe Diet Advancement Fair Barriers to Reach Goals Cognitive deficits Barriers/Prognosis Comment -- CHL IP DIET RECOMMENDATION 07/10/2016 SLP Diet Recommendations Dysphagia 1 (Puree) solids;Nectar thick liquid Liquid Administration via Cup;Straw Medication Administration Crushed with puree Compensations Slow rate;Small sips/bites;Chin tuck;Multiple dry swallows after each bite/sip;Clear throat intermittently Postural Changes Remain semi-upright after after feeds/meals (Comment);Seated upright at 90 degrees   CHL IP OTHER RECOMMENDATIONS 07/10/2016 Recommended Consults -- Oral  Care Recommendations  Oral care BID Other Recommendations Have oral suction available;Order thickener from pharmacy   CHL IP FOLLOW UP RECOMMENDATIONS 07/10/2016 Follow up Recommendations Skilled Nursing facility   [facility] IP FREQUENCY AND DURATION 07/10/2016 Speech Therapy Frequency (ACUTE ONLY) min 2x/week Treatment Duration 2 weeks      CHL IP ORAL PHASE 07/10/2016 Oral Phase Impaired Oral - Pudding Teaspoon -- Oral - Pudding Cup -- Oral - Honey Teaspoon -- Oral - Honey Cup -- Oral - Nectar Teaspoon Lingual/palatal residue;Weak lingual manipulation;Reduced posterior propulsion;Decreased bolus cohesion;Delayed oral transit Oral - Nectar Cup Lingual/palatal residue;Weak lingual manipulation;Reduced posterior propulsion;Decreased bolus cohesion;Delayed oral transit Oral - Nectar Straw Lingual/palatal residue;Weak lingual manipulation;Reduced posterior propulsion;Decreased bolus cohesion;Delayed oral transit Oral - Thin Teaspoon -- Oral - Thin Cup Lingual/palatal residue;Weak lingual manipulation;Reduced posterior propulsion;Decreased bolus cohesion;Delayed oral transit Oral - Thin Straw -- Oral - Puree -- Oral - Mech Soft Lingual/palatal residue;Weak lingual manipulation;Reduced posterior propulsion;Delayed oral transit Oral - Regular -- Oral - Multi-Consistency -- Oral - Pill Lingual/palatal residue;Weak lingual manipulation;Reduced posterior propulsion;Decreased bolus cohesion;Delayed oral transit Oral Phase - Comment --  CHL IP PHARYNGEAL PHASE 07/10/2016 Pharyngeal Phase Impaired Pharyngeal- Pudding Teaspoon -- Pharyngeal -- Pharyngeal- Pudding Cup -- Pharyngeal -- Pharyngeal- Honey Teaspoon -- Pharyngeal -- Pharyngeal- Honey Cup -- Pharyngeal -- Pharyngeal- Nectar Teaspoon Reduced tongue base retraction;Reduced laryngeal elevation;Reduced epiglottic inversion;Reduced pharyngeal peristalsis;Reduced anterior laryngeal mobility;Pharyngeal residue - valleculae;Compensatory strategies attempted (with notebox);Penetration/Apiration after  swallow;Penetration/Aspiration during swallow Pharyngeal Material enters airway, remains ABOVE vocal cords and not ejected out Pharyngeal- Nectar Cup Reduced tongue base retraction;Reduced laryngeal elevation;Reduced epiglottic inversion;Reduced pharyngeal peristalsis;Reduced anterior laryngeal mobility;Pharyngeal residue - valleculae;Compensatory strategies attempted (with notebox);Penetration/Apiration after swallow;Penetration/Aspiration during swallow Pharyngeal Material does not enter airway;Material enters airway, remains ABOVE vocal cords and not ejected out Pharyngeal- Nectar Straw Reduced tongue base retraction;Reduced laryngeal elevation;Reduced epiglottic inversion;Reduced pharyngeal peristalsis;Reduced anterior laryngeal mobility;Pharyngeal residue - valleculae;Compensatory strategies attempted (with notebox);Penetration/Apiration after swallow;Penetration/Aspiration during swallow Pharyngeal Material does not enter airway Pharyngeal- Thin Teaspoon Reduced tongue base retraction;Reduced laryngeal elevation;Reduced epiglottic inversion;Reduced pharyngeal peristalsis;Reduced anterior laryngeal mobility;Pharyngeal residue - valleculae;Compensatory strategies attempted (with notebox);Penetration/Apiration after swallow;Penetration/Aspiration during swallow Pharyngeal Material enters airway, remains ABOVE vocal cords and not ejected out Pharyngeal- Thin Cup Reduced tongue base retraction;Reduced laryngeal elevation;Reduced epiglottic inversion;Reduced pharyngeal peristalsis;Reduced anterior laryngeal mobility;Pharyngeal residue - valleculae;Compensatory strategies attempted (with notebox);Penetration/Apiration after swallow;Penetration/Aspiration during swallow Pharyngeal Material enters airway, remains ABOVE vocal cords and not ejected out Pharyngeal- Thin Straw Reduced tongue base retraction;Reduced laryngeal elevation;Reduced epiglottic inversion;Reduced pharyngeal peristalsis;Reduced anterior laryngeal  mobility;Pharyngeal residue - valleculae;Compensatory strategies attempted (with notebox);Penetration/Apiration after swallow;Penetration/Aspiration during swallow;Moderate aspiration Pharyngeal Material enters airway, passes BELOW cords without attempt by patient to eject out (silent aspiration) Pharyngeal- Puree Reduced tongue base retraction;Reduced laryngeal elevation;Reduced epiglottic inversion;Reduced pharyngeal peristalsis;Reduced anterior laryngeal mobility;Pharyngeal residue - valleculae;Compensatory strategies attempted (with notebox) Pharyngeal Material does not enter airway Pharyngeal- Mechanical Soft -- Pharyngeal -- Pharyngeal- Regular Reduced tongue base retraction;Reduced laryngeal elevation;Reduced epiglottic inversion;Reduced pharyngeal peristalsis;Reduced anterior laryngeal mobility;Pharyngeal residue - valleculae;Compensatory strategies attempted (with notebox) Pharyngeal -- Pharyngeal- Multi-consistency -- Pharyngeal -- Pharyngeal- Pill -- Pharyngeal -- Pharyngeal Comment --  No flowsheet data found. No flowsheet data found. Herbie [place], [place] CCC-SLP [id number] [place], Katherene Ponto 07/10/2016, 3:24 PM              Mr Brain Ltd W/o Cm  Result Date: 07/03/2016 CLINICAL DATA:  82 y/o F; slurred speech, a aphasia, and right leg weakness. EXAM: MRI HEAD WITHOUT CONTRAST TECHNIQUE: Sagittal T1, axial DWI, and coronal  DWI sequences were acquired. The patient was unable to continue the exam. COMPARISON:  07/02/2016 CT head.  06/25/2016 MR head. FINDINGS: Motion degraded sagittal T1 and axial DWI sequences. No diffusion signal abnormality is identified. Hyperostosis frontalis interna. Moderate parenchymal volume loss. T1 hypo intense foci in subcortical and periventricular white matter as well as within the if corpus callosum are consistent with chronic microvascular ischemic changes and small lacunar infarcts. Mucous thickening within the sphenoid sinus. IMPRESSION: No diffusion signal  abnormality to suggest acute/early subacute infarct. Electronically Signed   By: Kristine Garbe M.D.   On: 07/03/2016 00:22    Assessment/Plan 1. Diarrhea, unspecified type Bounding heart sounds and low output Check labs to eval for dehyration/infection Ongoing diarrhea, would empirically tx with Flagyl 500 mg TID for 7 days Continue florastor no stop date Stool cx She has a hx of colon cancer but would not eval for this given her frail state of health.   2. Urinary retention Maintain foley due to low output but discussed removing it in the near future if IVF is not needed today  3. Insomnia due to medical condition Improved with melatonin 5 mg qhs Hoping to keep her up more during the day and sleeping better at night so that she can work with therapy  4. Delirium Much improved Multifactorial  5. Low urine output Due to diarrhea and poor intake  6. Physical deconditioning Continue PT and OT  7. Pessary maintenance Staff to obtain records from GYN. Schedule pelvic on Thursday if needed depending on what the notes from GYN say  8. UTI Complete therapy, no fever or urinary symptoms noted Remove foley as soon as indicated  Overall she has improved but my concern is that she will most likely not return to her previous state of wellbeing but rather will end up long term in skilled care if she recovers at all.  She remains a DNR but her family would still like her to receive treatment including IVF for dehydration.  Overall her condition is poor but much improved from admission.  Family/ staff Communication: discussed with Larene Beach the nurse, and the resident  Labs/tests ordered:  CBC BMP stool cx

## 2016-08-03 ENCOUNTER — Non-Acute Institutional Stay (SKILLED_NURSING_FACILITY): Payer: Medicare Other | Admitting: Adult Health

## 2016-08-03 ENCOUNTER — Encounter: Payer: Self-pay | Admitting: Adult Health

## 2016-08-03 DIAGNOSIS — N8110 Cystocele, unspecified: Secondary | ICD-10-CM | POA: Diagnosis not present

## 2016-08-03 DIAGNOSIS — Z4689 Encounter for fitting and adjustment of other specified devices: Secondary | ICD-10-CM | POA: Diagnosis not present

## 2016-08-03 DIAGNOSIS — N816 Rectocele: Secondary | ICD-10-CM

## 2016-08-03 DIAGNOSIS — B373 Candidiasis of vulva and vagina: Secondary | ICD-10-CM | POA: Diagnosis not present

## 2016-08-03 DIAGNOSIS — B3731 Acute candidiasis of vulva and vagina: Secondary | ICD-10-CM

## 2016-08-03 NOTE — Progress Notes (Signed)
Patient ID: Whitney Marquez, female   DOB: 05/23/1923, 82 y.o.   MRN: [id number]  Location:  [place]:  SNF (31) Provider:   Cindi Carbon, [place] [phone]   Mathews Argyle, MD  Patient Care Team: Lajean Manes, MD as PCP - General (Internal Medicine) Lajean Manes, MD (Internal Medicine)  Extended Emergency Contact Information Primary Emergency Contact: [place] Mobile Phone: [phone] Relation: Daughter Secondary Emergency Contact: Ernsberger,Deshae  [place] Phone: [phone] Relation: Daughter  Code Status:  DNR Goals of care: Advanced Directive information Advanced Directives 07/31/2016  Does patient have an advance directive? Yes  Type of Advance Directive Out of facility DNR (pink MOST or yellow form);Living will;Healthcare Power of Attorney  Does patient want to make changes to advanced directive? -  Copy of advanced directive(s) in chart? Yes  Pre-existing out of facility DNR order (yellow form or pink MOST form) Yellow form placed in chart (order not valid for inpatient use);Pink MOST form placed in chart (order not valid for inpatient use)     Chief Complaint  Patient presents with  . Acute Visit    pessary check    HPI:  Pt is a 82 y.o. female seen today for an acute visit for a pessary check. Notes from GYN indicate that she has a ring shaped pessary in place due to rectocele and cystocele, which has not been changed since 2015.  She does not have any abd pain, drainage, or vaginal complaints. She has dementia and is sleepy today for our visit and could not elaborate.  Staff report that she has a erythematous rash to her vaginal area and they have been using nystatin cream. She had a foley cath that was discontinued on 10/31.  She has had wet diapers since that time.   Past Medical History:  Diagnosis Date  . Acquired keratosis  of plantar aspect of foot 09/15/2013   right foot  . Aortic valve stenosis   . Arthritis   . Bursitis of hip   . Cancer [facility])    breast and colon  . Cancer ([place])   . Cataract   . Cataracts, both eyes   . Colitis 10/15/2014  . Compression fracture of lumbar vertebrae, non-traumatic ([place]) 10/15/2014  . Degenerative disc disease, lumbar   . Hammer toe   . Hypertension   . Metatarsalgia   . Partial thickness macular hole of right eye 10/2007  . Plantar keratosis   . Scoliosis    Past Surgical History:  Procedure Laterality Date  . BACK SURGERY    . BREAST SURGERY     breast cancer  . COLON SURGERY     colon cancer  . COLON SURGERY    . LUMBAR LAMINECTOMY    . meniscus tear    . TOE AMPUTATION     second toe    Allergies  Allergen Reactions  . Cephalosporins Anaphylaxis  . Ciprofloxacin Other (See Comments)    Son reports seizure-like activity  . Lisinopril Other (See Comments)    UNKNOWN REACTION on MAR  . Other Swelling    Walnuts causes lips swelling and ALL NUTS, per [facility] ANESTHESIA (as noted on MAR)  . Pyridium [Phenazopyridine Hcl] Other (See Comments)    UNKNOWN  . Aleve [Naproxen] Rash  . Iodine Itching and Rash  . Macrobid [Nitrofurantoin Monohyd Macro] Swelling and Rash  . Penicillins Hives and Rash  Has patient had a PCN reaction causing immediate rash, facial/tongue/throat swelling, SOB or lightheadedness with hypotension: Yes Has patient had a PCN reaction causing severe rash involving mucus membranes or skin necrosis: Unknown Has patient had a PCN reaction that required hospitalization: Unknown Has patient had a PCN reaction occurring within the last 10 years: No If all of the above answers are "NO", then may proceed with Cephalosporin use.   . Shellfish Allergy Itching and Rash      Medication List       Accurate as of 08/03/16 11:17 AM. Always use your most recent med list.          acetaminophen 500 MG tablet Commonly known as:   TYLENOL Take 1,000 mg by mouth 3 (three) times daily.   ARTIFICIAL TEARS 0.4 % Soln Generic drug:  Hypromellose Apply 2 drops to eye 3 (three) times daily.   aspirin 81 MG tablet Take 81 mg by mouth daily with breakfast.   BIOTENE DRY MOUTH MOISTURIZING Soln Take 2 application by mouth See admin instructions. 1-2 sprays into the mouth 4 times a day   lactose free nutrition Liqd Take 237 mLs by mouth 2 (two) times daily between meals.   REFRESH OP Apply 2 drops to eye at bedtime as needed (for dry eyes).   saccharomyces boulardii 250 MG capsule Commonly known as:  FLORASTOR Take 250 mg by mouth 2 (two) times daily.       Review of Systems  Unable to perform ROS: Dementia    Immunization History  Administered Date(s) Administered  . PPD Test 10/20/2014   Pertinent  Health Maintenance Due  Topic Date Due  . PNA vac Low Risk Adult (1 of 2 - PCV13) 10/31/1989  . INFLUENZA VACCINE  05/02/2016  . DEXA SCAN  Completed   No flowsheet data found. Functional Status Survey:    Vitals:   08/03/16 1113  BP: 115/68  Pulse: 94  Resp: 20  Temp: (!) 96.9 F (36.1 C)  SpO2: 99%   There is no height or weight on file to calculate BMI. Physical Exam  Constitutional: No distress.  Thin and frail  Abdominal: Soft. Bowel sounds are normal. She exhibits no distension.  Genitourinary: There is rash on the right labia. There is no tenderness, lesion or injury on the right labia. There is rash on the left labia. There is no tenderness, lesion or injury on the left labia. Right adnexum displays no mass. Left adnexum displays no mass. There is erythema in the vagina. Vaginal discharge (white) found.  Genitourinary Comments: Rectocele, cystocele  Skin: She is not diaphoretic.    Labs reviewed:  Recent Labs  07/05/16 0442  07/09/16 1502 07/10/16 0534 07/11/16 0601 07/13/16 1003 07/24/16 0400  NA 136  < > 151* 152* 144 139 138  K 2.9*  < > 3.6 3.5 3.1* 3.8 3.4  CL 102  < >  119* 113* 107  --   --   CO2 24  < > 29 32 29  --   --   GLUCOSE 79  < > 108* 97 108*  --   --   BUN 33*  < > 24* 23* 21* 14 12  CREATININE 0.65  < > 0.39* 0.45 0.36* 0.3* 0.2*  CALCIUM 9.0  < > 9.3 9.2 8.9  --   --   MG 2.0  --   --   --   --   --   --   < > =  values in this interval not displayed.  Recent Labs  07/02/16 2010 07/07/16 1502 07/08/16 0446  AST 23 46* 34  ALT 17 38 31  ALKPHOS 42 50 40  BILITOT 0.6 1.2 1.0  PROT 6.2* 6.7 5.6*  ALBUMIN 3.5 3.6 2.9*    Recent Labs  07/07/16 1502 07/08/16 0446 07/09/16 0514 07/10/16 0534 07/13/16 1003 07/24/16 0400  WBC 15.1* 12.3* 11.0* 12.5* 10.3 11.0  NEUTROABS 11.5*  --  7.6 9.2*  --   --   HGB 12.7 10.8* 10.0* 10.6* 10.0* 9.1*  HCT 40.2 35.3* 33.2* 35.0* 30* 29*  MCV 91.6 92.2 92.7 93.6  --   --   PLT 314 233 160 134* 188 428*   Lab Results  Component Value Date   TSH 4.127 07/02/2016   Lab Results  Component Value Date   HGBA1C 5.5 06/25/2016   Lab Results  Component Value Date   CHOL 124 06/25/2016   HDL 76 06/25/2016   LDLCALC 33 06/25/2016   TRIG 75 06/25/2016   CHOLHDL 1.6 06/25/2016    Significant Diagnostic Results in last 30 days:  Ct Head Wo Contrast  Result Date: 07/07/2016 CLINICAL DATA:  Altered mental status. Patient unresponsive. Hypoxia. Recent fall. EXAM: CT HEAD WITHOUT CONTRAST TECHNIQUE: Contiguous axial images were obtained from the base of the skull through the vertex without intravenous contrast. COMPARISON:  CT head 07/02/2016.  Initial CT head 06/30/2016. FINDINGS: Brain: Global atrophy. Small vessel disease. No hydrocephalus or intra-axial mass lesion. No features are seen to confirm cerebral infarction or anoxic injury. Increasing BILATERAL extra-axial CSF like fluid collections consistent with hygromas. 13 mm thick on the RIGHT and 12 mm thick on the LEFT, significant progression from most recent study 5 days ago. Sulcal flattening is present, without herniation. Vascular: No  hyperdense vessel or unexpected calcification. Skull: Normal. Negative for fracture or focal lesion. RIGHT frontal scalp hematoma redemonstrated. Sinuses/Orbits: No acute finding. Other: None. IMPRESSION: Significant increase in BILATERAL subdural hygromas since 06/30/16 and 07/02/2016. LEFT untreated, increased intracranial pressure or herniation could occur. Depending on the patient's clinical condition, neurosurgical consultation may be warranted. These results were called by telephone at the time of interpretation on 07/07/2016 at 5:29 pm to Dr. Jola Schmidt , who verbally acknowledged these results. Electronically Signed   By: Staci Righter M.D.   On: 07/07/2016 17:31   Dg Chest Port 1 View  Result Date: 07/10/2016 CLINICAL DATA:  Shortness breath. EXAM: PORTABLE CHEST 1 VIEW COMPARISON:  07/07/2016.  07/02/2016. FINDINGS: Stable appearance marked asymmetric elevation right hemidiaphragm. No evidence for airspace pulmonary edema or focal lung consolidation. Scarring or chronic atelectasis again noted left lung base. Cardiopericardial silhouette is at upper limits of normal for size. Bones are diffusely demineralized. IMPRESSION: Stable.  No new or progressive interval findings. Electronically Signed   By: Misty Stanley M.D.   On: 07/10/2016 10:16   Dg Chest Portable 1 View  Result Date: 07/07/2016 CLINICAL DATA:  Shortness of breath. EXAM: PORTABLE CHEST 1 VIEW COMPARISON:  07/02/2016 FINDINGS: Chronic elevation of the right hemidiaphragm. Heart and mediastinum are stable. Aortic arch has atherosclerotic calcifications. Surgical clips in the right axilla. Subtle interstitial densities throughout both lungs could represent mild edema versus chronic change. Negative for a pneumothorax. IMPRESSION: Question mild interstitial edema. Stable elevation of the right hemidiaphragm. Aortic atherosclerosis. Electronically Signed   By: Markus Daft M.D.   On: 07/07/2016 15:57   Dg Swallowing Func-speech  Pathology  Result Date: 07/10/2016 Objective Swallowing Evaluation:  Type of Study: MBS-Modified Barium Swallow Study Patient Details Name: JUN REIFSNYDER MRN: [id number] Date of Birth: 06-15-1923 Today's Date: 07/10/2016 Time: SLP Start Time (ACUTE ONLY): 1330-SLP Stop Time (ACUTE ONLY): 1355 SLP Time Calculation (min) (ACUTE ONLY): 25 min Past Medical History: Past Medical History: Diagnosis Date . Aortic valve stenosis  . Arthritis  . Cancer [facility])   breast and colon . Cancer ([place])  . Cataract  . Cataracts, both eyes  . Hypertension  . Plantar keratosis  . Scoliosis  Past Surgical History: Past Surgical History: Procedure Laterality Date . BACK SURGERY   . BREAST SURGERY    breast cancer . COLON SURGERY    colon cancer . COLON SURGERY   . LUMBAR LAMINECTOMY   . meniscus tear   . TOE AMPUTATION    second toe HPI: Stellar B Holmesis a 82 y.o.woman with a history of aortic stenosis, HTN, HLD, and breast and colon cancers who up until recently, was a resident in an independent living facility. She was admitted to the hospital 9/23-9/25 after  with a UTI. She was discharged on full strength Bactrim. She was back in the ED on 9/29 after having a fall that resulted in a laceration over her right eye and extensive bruising involving most of her right face and neck. She was diagnosed with UTI again and was discharged to home with antibiotics. She was re-admitted 10/2 with acute metabolic encephalopathy due to polypharmacy, recent UTI. Pt seen by SLP, determined to have significant dysphagia, but family wished to d/c quite early to return to familiar environment. Readmitted 10/6 for AMS and dehydration. Palliative care consulted to aid in discussion of end of life given progressive decline.  No Data Recorded Assessment / Plan / Recommendation CHL IP CLINICAL IMPRESSIONS 07/10/2016 Therapy Diagnosis Moderate oral phase dysphagia;Moderate pharyngeal phase dysphagia;Severe pharyngeal phase dysphagia Clinical Impression Pt  demonstrates a moderate to severe dysphagia due to generalized weakness and severely dry oral and base of tongue mucosa. Lingual propulsion of boluses was initially weak with liquids falling uncontrolled to oropharynx, though this improved as session progressed. Pt eventually able to masticate and form solid bolus. Hyolaryngeal mobility and base of tongue are weak and vallecular residuals are severe, likely also due to dry mucosa.  A chin tuck with straw sips dramatically improved propulsion of bolus through the pharynx and reduced residuals and penetration with nectar thick liquids. Recommend a dys 1 (puree) diet and nectar thick liquids with full supervision for a chin tuck and double swallows, intermittent throat clearing encouraged. Aspiration risk is still high though hopeful for improvement with regular intake. Pts daughter present for education. Will f/u for tolerance.  Impact on safety and function Moderate aspiration risk   CHL IP TREATMENT RECOMMENDATION 07/10/2016 Treatment Recommendations Therapy as outlined in treatment plan below   Prognosis 07/10/2016 Prognosis for Safe Diet Advancement Fair Barriers to Reach Goals Cognitive deficits Barriers/Prognosis Comment -- CHL IP DIET RECOMMENDATION 07/10/2016 SLP Diet Recommendations Dysphagia 1 (Puree) solids;Nectar thick liquid Liquid Administration via Cup;Straw Medication Administration Crushed with puree Compensations Slow rate;Small sips/bites;Chin tuck;Multiple dry swallows after each bite/sip;Clear throat intermittently Postural Changes Remain semi-upright after after feeds/meals (Comment);Seated upright at 90 degrees   CHL IP OTHER RECOMMENDATIONS 07/10/2016 Recommended Consults -- Oral Care Recommendations Oral care BID Other Recommendations Have oral suction available;Order thickener from pharmacy   CHL IP FOLLOW UP RECOMMENDATIONS 07/10/2016 Follow up Recommendations Skilled Nursing facility   [facility] IP FREQUENCY AND DURATION 07/10/2016 Speech Therapy  Frequency (ACUTE ONLY)  min 2x/week Treatment Duration 2 weeks      CHL IP ORAL PHASE 07/10/2016 Oral Phase Impaired Oral - Pudding Teaspoon -- Oral - Pudding Cup -- Oral - Honey Teaspoon -- Oral - Honey Cup -- Oral - Nectar Teaspoon Lingual/palatal residue;Weak lingual manipulation;Reduced posterior propulsion;Decreased bolus cohesion;Delayed oral transit Oral - Nectar Cup Lingual/palatal residue;Weak lingual manipulation;Reduced posterior propulsion;Decreased bolus cohesion;Delayed oral transit Oral - Nectar Straw Lingual/palatal residue;Weak lingual manipulation;Reduced posterior propulsion;Decreased bolus cohesion;Delayed oral transit Oral - Thin Teaspoon -- Oral - Thin Cup Lingual/palatal residue;Weak lingual manipulation;Reduced posterior propulsion;Decreased bolus cohesion;Delayed oral transit Oral - Thin Straw -- Oral - Puree -- Oral - Mech Soft Lingual/palatal residue;Weak lingual manipulation;Reduced posterior propulsion;Delayed oral transit Oral - Regular -- Oral - Multi-Consistency -- Oral - Pill Lingual/palatal residue;Weak lingual manipulation;Reduced posterior propulsion;Decreased bolus cohesion;Delayed oral transit Oral Phase - Comment --  CHL IP PHARYNGEAL PHASE 07/10/2016 Pharyngeal Phase Impaired Pharyngeal- Pudding Teaspoon -- Pharyngeal -- Pharyngeal- Pudding Cup -- Pharyngeal -- Pharyngeal- Honey Teaspoon -- Pharyngeal -- Pharyngeal- Honey Cup -- Pharyngeal -- Pharyngeal- Nectar Teaspoon Reduced tongue base retraction;Reduced laryngeal elevation;Reduced epiglottic inversion;Reduced pharyngeal peristalsis;Reduced anterior laryngeal mobility;Pharyngeal residue - valleculae;Compensatory strategies attempted (with notebox);Penetration/Apiration after swallow;Penetration/Aspiration during swallow Pharyngeal Material enters airway, remains ABOVE vocal cords and not ejected out Pharyngeal- Nectar Cup Reduced tongue base retraction;Reduced laryngeal elevation;Reduced epiglottic inversion;Reduced  pharyngeal peristalsis;Reduced anterior laryngeal mobility;Pharyngeal residue - valleculae;Compensatory strategies attempted (with notebox);Penetration/Apiration after swallow;Penetration/Aspiration during swallow Pharyngeal Material does not enter airway;Material enters airway, remains ABOVE vocal cords and not ejected out Pharyngeal- Nectar Straw Reduced tongue base retraction;Reduced laryngeal elevation;Reduced epiglottic inversion;Reduced pharyngeal peristalsis;Reduced anterior laryngeal mobility;Pharyngeal residue - valleculae;Compensatory strategies attempted (with notebox);Penetration/Apiration after swallow;Penetration/Aspiration during swallow Pharyngeal Material does not enter airway Pharyngeal- Thin Teaspoon Reduced tongue base retraction;Reduced laryngeal elevation;Reduced epiglottic inversion;Reduced pharyngeal peristalsis;Reduced anterior laryngeal mobility;Pharyngeal residue - valleculae;Compensatory strategies attempted (with notebox);Penetration/Apiration after swallow;Penetration/Aspiration during swallow Pharyngeal Material enters airway, remains ABOVE vocal cords and not ejected out Pharyngeal- Thin Cup Reduced tongue base retraction;Reduced laryngeal elevation;Reduced epiglottic inversion;Reduced pharyngeal peristalsis;Reduced anterior laryngeal mobility;Pharyngeal residue - valleculae;Compensatory strategies attempted (with notebox);Penetration/Apiration after swallow;Penetration/Aspiration during swallow Pharyngeal Material enters airway, remains ABOVE vocal cords and not ejected out Pharyngeal- Thin Straw Reduced tongue base retraction;Reduced laryngeal elevation;Reduced epiglottic inversion;Reduced pharyngeal peristalsis;Reduced anterior laryngeal mobility;Pharyngeal residue - valleculae;Compensatory strategies attempted (with notebox);Penetration/Apiration after swallow;Penetration/Aspiration during swallow;Moderate aspiration Pharyngeal Material enters airway, passes BELOW cords without  attempt by patient to eject out (silent aspiration) Pharyngeal- Puree Reduced tongue base retraction;Reduced laryngeal elevation;Reduced epiglottic inversion;Reduced pharyngeal peristalsis;Reduced anterior laryngeal mobility;Pharyngeal residue - valleculae;Compensatory strategies attempted (with notebox) Pharyngeal Material does not enter airway Pharyngeal- Mechanical Soft -- Pharyngeal -- Pharyngeal- Regular Reduced tongue base retraction;Reduced laryngeal elevation;Reduced epiglottic inversion;Reduced pharyngeal peristalsis;Reduced anterior laryngeal mobility;Pharyngeal residue - valleculae;Compensatory strategies attempted (with notebox) Pharyngeal -- Pharyngeal- Multi-consistency -- Pharyngeal -- Pharyngeal- Pill -- Pharyngeal -- Pharyngeal Comment --  No flowsheet data found. No flowsheet data found. Herbie Baltimore, MA CCC-SLP [phone] Lynann Beaver 07/10/2016, 3:24 PM               Assessment/Plan 1. Cystocele with rectocele Noted  2.Pessary maintanence Cleaned and replaced, may be ill fitting Needs to be changed q 3 months per GYN Recommend f/u with GYN in the future  3. Vaginal candidiasis Diflucan 150 mg x1 dose Continue nystatin powder with brief changes  Family/ staff Communication: discussed with resident/staff to communicate with family  Labs/tests ordered:  NA

## 2016-08-09 ENCOUNTER — Ambulatory Visit: Payer: Medicare Other | Admitting: Podiatry

## 2016-08-19 ENCOUNTER — Inpatient Hospital Stay (HOSPITAL_COMMUNITY): Payer: Medicare Other

## 2016-08-19 ENCOUNTER — Other Ambulatory Visit: Payer: Self-pay

## 2016-08-19 ENCOUNTER — Inpatient Hospital Stay (HOSPITAL_COMMUNITY)
Admission: EM | Admit: 2016-08-19 | Discharge: 2016-09-01 | DRG: 189 | Disposition: E | Payer: Medicare Other | Attending: Internal Medicine | Admitting: Internal Medicine

## 2016-08-19 ENCOUNTER — Emergency Department (HOSPITAL_COMMUNITY): Payer: Medicare Other

## 2016-08-19 DIAGNOSIS — Z91013 Allergy to seafood: Secondary | ICD-10-CM

## 2016-08-19 DIAGNOSIS — N179 Acute kidney failure, unspecified: Secondary | ICD-10-CM | POA: Diagnosis not present

## 2016-08-19 DIAGNOSIS — Z85038 Personal history of other malignant neoplasm of large intestine: Secondary | ICD-10-CM

## 2016-08-19 DIAGNOSIS — Z7982 Long term (current) use of aspirin: Secondary | ICD-10-CM | POA: Diagnosis not present

## 2016-08-19 DIAGNOSIS — Z881 Allergy status to other antibiotic agents status: Secondary | ICD-10-CM

## 2016-08-19 DIAGNOSIS — I350 Nonrheumatic aortic (valve) stenosis: Secondary | ICD-10-CM | POA: Diagnosis present

## 2016-08-19 DIAGNOSIS — Z66 Do not resuscitate: Secondary | ICD-10-CM | POA: Diagnosis present

## 2016-08-19 DIAGNOSIS — E860 Dehydration: Secondary | ICD-10-CM | POA: Diagnosis present

## 2016-08-19 DIAGNOSIS — B952 Enterococcus as the cause of diseases classified elsewhere: Secondary | ICD-10-CM | POA: Diagnosis present

## 2016-08-19 DIAGNOSIS — N390 Urinary tract infection, site not specified: Secondary | ICD-10-CM | POA: Diagnosis present

## 2016-08-19 DIAGNOSIS — R296 Repeated falls: Secondary | ICD-10-CM | POA: Diagnosis present

## 2016-08-19 DIAGNOSIS — Z91041 Radiographic dye allergy status: Secondary | ICD-10-CM | POA: Diagnosis not present

## 2016-08-19 DIAGNOSIS — J9601 Acute respiratory failure with hypoxia: Principal | ICD-10-CM | POA: Diagnosis present

## 2016-08-19 DIAGNOSIS — Z91018 Allergy to other foods: Secondary | ICD-10-CM | POA: Diagnosis not present

## 2016-08-19 DIAGNOSIS — I679 Cerebrovascular disease, unspecified: Secondary | ICD-10-CM | POA: Diagnosis present

## 2016-08-19 DIAGNOSIS — J690 Pneumonitis due to inhalation of food and vomit: Secondary | ICD-10-CM | POA: Diagnosis present

## 2016-08-19 DIAGNOSIS — R112 Nausea with vomiting, unspecified: Secondary | ICD-10-CM | POA: Diagnosis present

## 2016-08-19 DIAGNOSIS — Z888 Allergy status to other drugs, medicaments and biological substances status: Secondary | ICD-10-CM

## 2016-08-19 DIAGNOSIS — I10 Essential (primary) hypertension: Secondary | ICD-10-CM | POA: Diagnosis present

## 2016-08-19 DIAGNOSIS — D72829 Elevated white blood cell count, unspecified: Secondary | ICD-10-CM

## 2016-08-19 DIAGNOSIS — R000 Tachycardia, unspecified: Secondary | ICD-10-CM | POA: Diagnosis present

## 2016-08-19 DIAGNOSIS — R140 Abdominal distension (gaseous): Secondary | ICD-10-CM | POA: Diagnosis present

## 2016-08-19 DIAGNOSIS — Z79899 Other long term (current) drug therapy: Secondary | ICD-10-CM

## 2016-08-19 DIAGNOSIS — Z853 Personal history of malignant neoplasm of breast: Secondary | ICD-10-CM | POA: Diagnosis not present

## 2016-08-19 DIAGNOSIS — M419 Scoliosis, unspecified: Secondary | ICD-10-CM | POA: Diagnosis present

## 2016-08-19 DIAGNOSIS — Z880 Allergy status to penicillin: Secondary | ICD-10-CM

## 2016-08-19 DIAGNOSIS — R0602 Shortness of breath: Secondary | ICD-10-CM

## 2016-08-19 DIAGNOSIS — G308 Other Alzheimer's disease: Secondary | ICD-10-CM

## 2016-08-19 DIAGNOSIS — J9600 Acute respiratory failure, unspecified whether with hypoxia or hypercapnia: Secondary | ICD-10-CM | POA: Diagnosis present

## 2016-08-19 DIAGNOSIS — F03918 Unspecified dementia, unspecified severity, with other behavioral disturbance: Secondary | ICD-10-CM | POA: Diagnosis present

## 2016-08-19 DIAGNOSIS — R0600 Dyspnea, unspecified: Secondary | ICD-10-CM

## 2016-08-19 DIAGNOSIS — F0391 Unspecified dementia with behavioral disturbance: Secondary | ICD-10-CM | POA: Diagnosis present

## 2016-08-19 DIAGNOSIS — F0281 Dementia in other diseases classified elsewhere with behavioral disturbance: Secondary | ICD-10-CM

## 2016-08-19 LAB — URINALYSIS, ROUTINE W REFLEX MICROSCOPIC
Bilirubin Urine: NEGATIVE
Glucose, UA: NEGATIVE mg/dL
Ketones, ur: NEGATIVE mg/dL
NITRITE: NEGATIVE
Protein, ur: 100 mg/dL — AB
SPECIFIC GRAVITY, URINE: 1.023 (ref 1.005–1.030)
pH: 7 (ref 5.0–8.0)

## 2016-08-19 LAB — COMPREHENSIVE METABOLIC PANEL
ALT: 14 U/L (ref 14–54)
AST: 22 U/L (ref 15–41)
Albumin: 2.9 g/dL — ABNORMAL LOW (ref 3.5–5.0)
Alkaline Phosphatase: 54 U/L (ref 38–126)
Anion gap: 13 (ref 5–15)
BUN: 34 mg/dL — ABNORMAL HIGH (ref 6–20)
CHLORIDE: 96 mmol/L — AB (ref 101–111)
CO2: 32 mmol/L (ref 22–32)
Calcium: 9.3 mg/dL (ref 8.9–10.3)
Creatinine, Ser: 0.5 mg/dL (ref 0.44–1.00)
Glucose, Bld: 178 mg/dL — ABNORMAL HIGH (ref 65–99)
POTASSIUM: 3.4 mmol/L — AB (ref 3.5–5.1)
Sodium: 141 mmol/L (ref 135–145)
Total Bilirubin: 0.7 mg/dL (ref 0.3–1.2)
Total Protein: 6.7 g/dL (ref 6.5–8.1)

## 2016-08-19 LAB — CBC
HCT: 28.4 % — ABNORMAL LOW (ref 36.0–46.0)
Hemoglobin: 9 g/dL — ABNORMAL LOW (ref 12.0–15.0)
MCH: 28.2 pg (ref 26.0–34.0)
MCHC: 31.7 g/dL (ref 30.0–36.0)
MCV: 89 fL (ref 78.0–100.0)
PLATELETS: 560 10*3/uL — AB (ref 150–400)
RBC: 3.19 MIL/uL — ABNORMAL LOW (ref 3.87–5.11)
RDW: 17 % — AB (ref 11.5–15.5)
WBC: 19 10*3/uL — AB (ref 4.0–10.5)

## 2016-08-19 LAB — MRSA PCR SCREENING: MRSA by PCR: NEGATIVE

## 2016-08-19 LAB — URINE MICROSCOPIC-ADD ON
RBC / HPF: NONE SEEN RBC/hpf (ref 0–5)
SQUAMOUS EPITHELIAL / LPF: NONE SEEN

## 2016-08-19 LAB — D-DIMER, QUANTITATIVE (NOT AT ARMC): D DIMER QUANT: 2.2 ug{FEU}/mL — AB (ref 0.00–0.50)

## 2016-08-19 LAB — BRAIN NATRIURETIC PEPTIDE: B Natriuretic Peptide: 251.1 pg/mL — ABNORMAL HIGH (ref 0.0–100.0)

## 2016-08-19 MED ORDER — ACETAMINOPHEN 650 MG RE SUPP
650.0000 mg | Freq: Four times a day (QID) | RECTAL | Status: DC | PRN
Start: 2016-08-19 — End: 2016-08-20

## 2016-08-19 MED ORDER — AZTREONAM 1 G IJ SOLR: 1.0000 g | Freq: Once | INTRAMUSCULAR | Status: DC

## 2016-08-19 MED ORDER — AZTREONAM IN DEXTROSE 1 GM/50ML IV SOLN
1.0000 g | Freq: Once | INTRAVENOUS | Status: AC
  Administered 2016-08-19: 1 g via INTRAVENOUS
  Filled 2016-08-19: qty 50

## 2016-08-19 MED ORDER — TECHNETIUM TC 99M DIETHYLENETRIAME-PENTAACETIC ACID: 32.7000 | Freq: Once | INTRAVENOUS | Status: DC | PRN

## 2016-08-19 MED ORDER — IPRATROPIUM-ALBUTEROL 0.5-2.5 (3) MG/3ML IN SOLN
3.0000 mL | RESPIRATORY_TRACT | Status: DC | PRN
  Administered 2016-08-19: 3 mL via RESPIRATORY_TRACT
  Filled 2016-08-19: qty 3

## 2016-08-19 MED ORDER — ASPIRIN EC 81 MG PO TBEC: 81.0000 mg | DELAYED_RELEASE_TABLET | Freq: Every day | ORAL | Status: DC

## 2016-08-19 MED ORDER — SODIUM CHLORIDE 0.9 % IV SOLN
30.0000 meq | Freq: Once | INTRAVENOUS | Status: AC
  Administered 2016-08-19: 30 meq via INTRAVENOUS
  Filled 2016-08-19: qty 15

## 2016-08-19 MED ORDER — HYPROMELLOSE 0.4 % OP SOLN: 2.0000 [drp] | Freq: Three times a day (TID) | OPHTHALMIC | Status: DC

## 2016-08-19 MED ORDER — AZTREONAM IN DEXTROSE 1 GM/50ML IV SOLN
1.0000 g | Freq: Three times a day (TID) | INTRAVENOUS | Status: DC
  Administered 2016-08-19 – 2016-08-20 (×2): 1 g via INTRAVENOUS
  Filled 2016-08-19 (×2): qty 50

## 2016-08-19 MED ORDER — POLYVINYL ALCOHOL 1.4 % OP SOLN
1.0000 [drp] | OPHTHALMIC | Status: DC | PRN
  Filled 2016-08-19: qty 15

## 2016-08-19 MED ORDER — BIOTENE DRY MOUTH MOISTURIZING MT SOLN: 2.0000 "application " | OROMUCOSAL | Status: DC

## 2016-08-19 MED ORDER — SODIUM CHLORIDE 0.9 % IV BOLUS (SEPSIS)
1000.0000 mL | Freq: Once | INTRAVENOUS | Status: AC
  Administered 2016-08-19: 1000 mL via INTRAVENOUS

## 2016-08-19 MED ORDER — DEXTROSE 5 % IV SOLN
1.0000 g | Freq: Three times a day (TID) | INTRAVENOUS | Status: DC
  Filled 2016-08-19: qty 1

## 2016-08-19 MED ORDER — ONDANSETRON HCL 4 MG/2ML IJ SOLN
4.0000 mg | Freq: Four times a day (QID) | INTRAMUSCULAR | Status: DC | PRN
  Administered 2016-08-19: 4 mg via INTRAVENOUS
  Filled 2016-08-19: qty 2

## 2016-08-19 MED ORDER — SACCHAROMYCES BOULARDII 250 MG PO CAPS: 250.0000 mg | ORAL_CAPSULE | Freq: Two times a day (BID) | ORAL | Status: DC

## 2016-08-19 MED ORDER — TECHNETIUM TO 99M ALBUMIN AGGREGATED
4.2000 | Freq: Once | INTRAVENOUS | Status: AC | PRN
  Administered 2016-08-19: 4.2 via INTRAVENOUS

## 2016-08-19 MED ORDER — ACETAMINOPHEN 325 MG PO TABS
650.0000 mg | ORAL_TABLET | Freq: Four times a day (QID) | ORAL | Status: DC | PRN
Start: 2016-08-19 — End: 2016-08-20

## 2016-08-19 MED ORDER — FUROSEMIDE 10 MG/ML IJ SOLN
20.0000 mg | INTRAMUSCULAR | Status: AC
  Administered 2016-08-19: 20 mg via INTRAVENOUS
  Filled 2016-08-19: qty 2

## 2016-08-19 MED ORDER — ONDANSETRON HCL 4 MG PO TABS: 4.0000 mg | ORAL_TABLET | Freq: Four times a day (QID) | ORAL | Status: DC | PRN

## 2016-08-19 MED ORDER — DEXTROSE 5 % IV SOLN
1.0000 g | Freq: Once | INTRAVENOUS | Status: DC
  Filled 2016-08-19: qty 1

## 2016-08-19 MED ORDER — ONDANSETRON HCL 4 MG/2ML IJ SOLN
4.0000 mg | Freq: Once | INTRAMUSCULAR | Status: AC
  Administered 2016-08-19: 4 mg via INTRAVENOUS
  Filled 2016-08-19: qty 2

## 2016-08-19 MED ORDER — HEPARIN SODIUM (PORCINE) 5000 UNIT/ML IJ SOLN
5000.0000 [IU] | Freq: Three times a day (TID) | INTRAMUSCULAR | Status: DC
Start: 2016-08-19 — End: 2016-08-20
  Administered 2016-08-19: 5000 [IU] via SUBCUTANEOUS
  Filled 2016-08-19: qty 1

## 2016-08-19 MED ORDER — BIOTENE DRY MOUTH MT LIQD: 15.0000 mL | OROMUCOSAL | Status: DC | PRN

## 2016-08-19 MED ORDER — ACETAMINOPHEN 500 MG PO TABS: 1000.0000 mg | ORAL_TABLET | Freq: Three times a day (TID) | ORAL | Status: DC

## 2016-08-19 MED ORDER — SODIUM CHLORIDE 0.9 % IV SOLN
INTRAVENOUS | Status: DC
  Administered 2016-08-19: 14:00:00 via INTRAVENOUS

## 2016-08-19 MED ORDER — CARBOXYMETHYLCELLULOSE SODIUM 1 % OP SOLN: 1.0000 [drp] | Freq: Three times a day (TID) | OPHTHALMIC | Status: DC

## 2016-08-19 NOTE — ED Notes (Signed)
Bed: [id number] Expected date:  Expected time:  Means of arrival:  Comments: 82 yo F  Vomiting x 1

## 2016-08-19 NOTE — ED Notes (Signed)
She remains in no distress; and her daughter remains with her. As I write this, she is undergoing I & O cath.

## 2016-08-19 NOTE — ED Triage Notes (Signed)
144/88 HR 90 resp 22 shallow  CBG 144 GCS 15 per [facility]

## 2016-08-19 NOTE — Progress Notes (Signed)
Pharmacy Antibiotic Note  Whitney Marquez is a 82 y.o. female admitted on 08/23/2016 with UTI.  She complains of abdominal pain, vomiting, and hypoxia.  Pharmacy has been consulted for Aztreonam dosing.  Multiple antibiotic allergies noted below.    Plan: Aztreonam 1gm IV q8h  Monitor renal function and cx data      Temp (24hrs), Avg:98.1 F (36.7 C), Min:97.9 F (36.6 C), Max:98.2 F (36.8 C)   Recent Labs Lab 08/22/2016 0737  WBC 19.0*  CREATININE 0.50    CrCl cannot be calculated (Unknown ideal weight.).    Allergies  Allergen Reactions  . Cephalosporins Anaphylaxis  . Ciprofloxacin Other (See Comments)    Son reports seizure-like activity  . Lisinopril Other (See Comments)    UNKNOWN REACTION on MAR  . Other Swelling    Walnuts causes lips swelling and ALL NUTS, per [facility] ANESTHESIA (as noted on MAR)  . Pyridium [Phenazopyridine Hcl] Other (See Comments)    UNKNOWN  . Aleve [Naproxen] Rash  . Iodine Itching and Rash  . Macrobid [Nitrofurantoin Monohyd Macro] Swelling and Rash  . Penicillins Hives and Rash    Has patient had a PCN reaction causing immediate rash, facial/tongue/throat swelling, SOB or lightheadedness with hypotension: Yes Has patient had a PCN reaction causing severe rash involving mucus membranes or skin necrosis: Unknown Has patient had a PCN reaction that required hospitalization: Unknown Has patient had a PCN reaction occurring within the last 10 years: No If all of the above answers are "NO", then may proceed with Cephalosporin use.   . Shellfish Allergy Itching and Rash    Antimicrobials this admission: 11/18 Aztreonam >>   Dose adjustments this admission:  Microbiology results: 11/18 BCx: sent 11/18 UCx: sent   Thank you for allowing pharmacy to be a part of this patient's care.  Whitney Marquez, PharmD, BCPS Pager: [phone] 08/10/2016 2:16 PM

## 2016-08-19 NOTE — Progress Notes (Addendum)
Dr. Tamala Julian notified at this time regarding pt HR of 145 and BNP results. Will continue to monitor.

## 2016-08-19 NOTE — ED Notes (Signed)
She is taken for VQ scan at this time. She remains drowsy and in no distress.

## 2016-08-19 NOTE — ED Notes (Signed)
Will transport after CT chest is performed. This was ordered by Dr. Tana Coast upon me informing her of pt's. D dimer.

## 2016-08-19 NOTE — ED Triage Notes (Addendum)
Pt sent to ED for dark colored emesis x1 per [facility] staff

## 2016-08-19 NOTE — Progress Notes (Signed)
Pt intermittently going into ST rate of 175 non-sustained. MD, Dr. Tamala Julian paged at this time. Will continue to monitor.

## 2016-08-19 NOTE — ED Notes (Signed)
She remains in no distress. Her verbal responses are slow and appropriate.

## 2016-08-19 NOTE — H&P (Addendum)
History and Physical        Hospital Admission Note Date: 08/26/2016  Patient name: Whitney Marquez Medical record number: [id number] Date of birth: [birth year] Age: 82 y.o. Gender: female  PCP: Mathews Argyle, MD   Referring physician: Dr Coralyn Pear   Patient coming from: [place]    Chief Complaint:  Sent from rehabilitation for hypoxia  HPI: Patient is a 82 year old female with dementia, aortic valve stenosis, hypertension recent falls in a skilled nursing facility, was sent from the rehabilitation for hypoxia, nausea and vomiting, yesterday last night. History was obtained from the patient's daughter at the bedside. Patient's daughter stated that a month ago patient was living in I and independent living facility at [facility], subsequently had a fall but had no fractures and was moved to skilled nursing for rehabilitation. She currently has also private caregivers. Last night, patient had nausea and vomiting, couple of episodes, no hematemesis normal BM yesterday, no diarrhea, mild abdominal distention with crampy abdominal pain which is now improved. No fevers or chills, no dysuria. However this morning, the staff noticed that patient is breathing was shallow and she was hypoxic. So she was sent to ER for evaluation.  At the time of my examination, Patient does not have any shallow breathing, however O2 sats are between 89-91% on 5 L of O2 via nasal cannula. Per daughter at the bedside, patient does not wear any oxygen at baseline.  ED work-up/course:  BMET potassium 3.4 BUN 34, creatinine 0.5 LFTs normal WBC 19.0, hemoglobin 9.0 platelets 560, UA grossly positive for UTI   acute abdominal xray including AP chest showed no obstruction or free air with elevated right hemidiaphragm, bibasilar atelectasis  Review of Systems: Positives marked in 'bold' Review of  systems difficult to obtain from the patient due to dementia.  Past Medical History: Past Medical History:  Diagnosis Date  . Acquired keratosis of plantar aspect of foot 09/15/2013   right foot  . Aortic valve stenosis   . Arthritis   . Bursitis of hip   . Cancer [facility])    breast and colon  . Cancer ([place])   . Cataract   . Cataracts, both eyes   . Colitis 10/15/2014  . Compression fracture of lumbar vertebrae, non-traumatic ([place]) 10/15/2014  . Degenerative disc disease, lumbar   . Hammer toe   . Hypertension   . Metatarsalgia   . Partial thickness macular hole of right eye 10/2007  . Plantar keratosis   . Scoliosis     Past Surgical History:  Procedure Laterality Date  . BACK SURGERY    . BREAST SURGERY     breast cancer  . COLON SURGERY     colon cancer  . COLON SURGERY    . LUMBAR LAMINECTOMY    . meniscus tear    . TOE AMPUTATION     second toe    Medications: Prior to Admission medications   Medication Sig Start Date End Date Taking? Authorizing Provider  acetaminophen (TYLENOL) 500 MG tablet Take 1,000 mg by mouth 3 (three) times daily.   Yes Historical Provider, MD  Artificial Saliva (BIOTENE DRY MOUTH MOISTURIZING) SOLN Take 2 application by mouth  See admin instructions. 1-2 sprays into the mouth 4 times a day   Yes Historical Provider, MD  aspirin 81 MG tablet Take 81 mg by mouth daily with breakfast.    Yes Historical Provider, MD  Hypromellose (ARTIFICIAL TEARS) 0.4 % SOLN Apply 2 drops to eye 3 (three) times daily.   Yes Historical Provider, MD  Melatonin 10 MG TABS Take 5 mg by mouth at bedtime.   Yes Historical Provider, MD  Polyvinyl Alcohol-Povidone (REFRESH OP) Apply 2 drops to eye at bedtime as needed (for dry eyes).    Yes Historical Provider, MD  saccharomyces boulardii (FLORASTOR) 250 MG capsule Take 250 mg by mouth 2 (two) times daily.   Yes Historical Provider, MD    Allergies:   Allergies  Allergen Reactions  . Cephalosporins Anaphylaxis    . Ciprofloxacin Other (See Comments)    Son reports seizure-like activity  . Lisinopril Other (See Comments)    UNKNOWN REACTION on MAR  . Other Swelling    Walnuts causes lips swelling and ALL NUTS, per [facility] ANESTHESIA (as noted on MAR)  . Pyridium [Phenazopyridine Hcl] Other (See Comments)    UNKNOWN  . Aleve [Naproxen] Rash  . Iodine Itching and Rash  . Macrobid [Nitrofurantoin Monohyd Macro] Swelling and Rash  . Penicillins Hives and Rash    Has patient had a PCN reaction causing immediate rash, facial/tongue/throat swelling, SOB or lightheadedness with hypotension: Yes Has patient had a PCN reaction causing severe rash involving mucus membranes or skin necrosis: Unknown Has patient had a PCN reaction that required hospitalization: Unknown Has patient had a PCN reaction occurring within the last 10 years: No If all of the above answers are "NO", then may proceed with Cephalosporin use.   . Shellfish Allergy Itching and Rash    Social History:  reports that she has never smoked. She has never used smokeless tobacco. She reports that she does not drink alcohol or use drugs.  Family History: No history of diabetes or coronary disease in family.  Physical Exam: Blood pressure 143/77, pulse 99, temperature 97.9 F (36.6 C), temperature source Oral, resp. rate 16, SpO2 (!) 88 %. General: Alert, awake, oriented x2, in no acute distress. HEENT: normocephalic, atraumatic, anicteric sclera, pink conjunctiva, pupils equal and reactive to light and accomodation, oropharynx clear Neck: supple, no masses or lymphadenopathy, no goiter, no bruits  Heart: Regular rate and rhythm, + 3/6 SEM  Lungs: Decreased breath sounds at the bases Abdomen: Soft, nontender, Mildly distended, +bowel sounds, no masses. Extremities: No clubbing, cyanosis or edema with positive pedal pulses. Neuro: Grossly intact, no focal neurological deficits, strength 5/5 upper and lower extremities bilaterally Psych:  alert and oriented x 2 normal mood and affect Skin: no rashes or lesions, warm and dry   LABS on Admission:  Basic Metabolic Panel:  Recent Labs Lab 08/21/2016 0737  NA 141  K 3.4*  CL 96*  CO2 32  GLUCOSE 178*  BUN 34*  CREATININE 0.50  CALCIUM 9.3   Liver Function Tests:  Recent Labs Lab 08/31/2016 0737  AST 22  ALT 14  ALKPHOS 54  BILITOT 0.7  PROT 6.7  ALBUMIN 2.9*   No results for input(s): LIPASE, AMYLASE in the last 168 hours. No results for input(s): AMMONIA in the last 168 hours. CBC:  Recent Labs Lab 08/25/2016 0737  WBC 19.0*  HGB 9.0*  HCT 28.4*  MCV 89.0  PLT 560*   Cardiac Enzymes: No results for input(s): CKTOTAL, CKMB, CKMBINDEX, TROPONINI  in the last 168 hours. BNP: Invalid input(s): POCBNP CBG: No results for input(s): GLUCAP in the last 168 hours.  Radiological Exams on Admission:  No results found.  *I have personally reviewed the images above*     Assessment/Plan Principal Problem:   Acute respiratory failure ([place]) with hypoxia: Differential diagnosis includes aspiration pneumonia from nausea and vomiting yesterday versus pulmonary embolism (recent fall, and has been in rehabilitation in the last 1 month) versus atelectasis. Patient does not have a history of COPD, does not wear oxygen at the baseline - Obtain stat two-view chest x-ray, d-dimer. If negative, will obtain CT angiogram of the chest to rule out pulmonary embolism - Patient has been started on aztreonam for UTI as she has multiple antibiotics allergy and which will cover for possible aspiration pneumonia - Obtain blood cultures  Active Problems:    UTI (urinary tract infection) - Follow urine cultures, continue aztreonam   Nausea/vomiting/mild abdominal distention - Currently no nausea or vomiting. Acute abdominal series negative for any obstruction or free air.  - will monitor closely if patient has any abdominal pain, will obtain CT abdomen and pelvis.     Dehydration -Continue gentle hydration    History of Aortic stenosis - Avoid dehydration     Dementia with behavioral disturbance - Avoid narcotics, currently stable    Cerebrovascular disease  - Continue aspirin, currently stable, no focal neurological deficits   DVT prophylaxis:  heparin subcutaneous for now   CODE STATUS:  discussed in detail with the patient's daughter at the bedside who is the health power of attorney, DO NOT RESUSCITATE status   Consults called:  none   Family Communication: Admission, patients condition and plan of care including tests being ordered have been discussed with the patient and Daughter who indicates understanding and agree with the plan and Code Status  Admission status:  MedSurg inpatient   Disposition plan: Further plan will depend as patient's clinical course evolves and further radiologic and laboratory data become available. Likely SNF on Mon  At the time of admission, it appears that the appropriate admission status for this patient is INPATIENT . This is judged to be reasonable and necessary in order to provide the required intensity of service to ensure the patient's safety given the presenting symptoms, physical exam findings, and initial radiographic and laboratory data in the context of their chronic comorbidities.    Time Spent on Admission: 78mins   Nykira Reddix M.D. [facility] 08/26/2016, 10:58 AM Pager: [phone]  If 7PM-7AM, please contact night-coverage [email or web address] Password [email or web address]

## 2016-08-19 NOTE — ED Notes (Signed)
She has just vomited a quantity of green bile after a strident cough. She vomits a second time in the absence of cough--med. Given. We are awaiting CT scan.

## 2016-08-19 NOTE — ED Provider Notes (Signed)
[place] DEPT Provider Note   CSN: [id number] Arrival date & time: 08/11/2016  0555     History   Chief Complaint Chief Complaint  Patient presents with  . Nausea    HPI Whitney Marquez is a 82 y.o. female.  Patient presents c/o nausea and vomiting last pm, states episodic, a couple episodes, was very dark in color. Had normal bm yesterday. No diarrhea.  Mild abd distension. Had mild diffuse, crampy, non radiating, abd pain earlier, currently improved.  Denies fever or chills. No dysuria or gu c/o.    The history is provided by the patient.    Past Medical History:  Diagnosis Date  . Acquired keratosis of plantar aspect of foot 09/15/2013   right foot  . Aortic valve stenosis   . Arthritis   . Bursitis of hip   . Cancer [facility])    breast and colon  . Cancer ([place])   . Cataract   . Cataracts, both eyes   . Colitis 10/15/2014  . Compression fracture of lumbar vertebrae, non-traumatic ([place]) 10/15/2014  . Degenerative disc disease, lumbar   . Hammer toe   . Hypertension   . Metatarsalgia   . Partial thickness macular hole of right eye 10/2007  . Plantar keratosis   . Scoliosis     Patient Active Problem List   Diagnosis Date Noted  . Cystocele with rectocele 08/03/2016  . Pessary maintenance 07/31/2016  . Cerebrovascular disease 07/13/2016  . Urinary retention 07/13/2016  . Dysphagia 07/13/2016  . Dementia with behavioral disturbance   . Goals of care, counseling/discussion   . Palliative care encounter   . Subdural hygroma 07/07/2016  . Elevated troponin 07/07/2016  . Delirium 07/03/2016  . Dehydration 07/03/2016  . Right hip pain 07/03/2016  . Positive D-dimer 07/03/2016  . Aortic stenosis 07/03/2016  . Difficulty walking   . Muscle weakness (generalized)   . Lower urinary tract infectious disease 06/24/2016  . Compression fracture of L1 lumbar vertebra with routine healing 10/19/2014  . Hypokalemia   . Anemia 10/15/2014  . Essential hypertension  10/15/2014  . Hyperlipidemia 10/15/2014    Past Surgical History:  Procedure Laterality Date  . BACK SURGERY    . BREAST SURGERY     breast cancer  . COLON SURGERY     colon cancer  . COLON SURGERY    . LUMBAR LAMINECTOMY    . meniscus tear    . TOE AMPUTATION     second toe    OB History    Gravida Para Term Preterm AB Living   0 0 0 0 0     SAB TAB Ectopic Multiple Live Births   0 0 0           Home Medications    Prior to Admission medications   Medication Sig Start Date End Date Taking? Authorizing Provider  acetaminophen (TYLENOL) 500 MG tablet Take 1,000 mg by mouth 3 (three) times daily.   Yes Historical Provider, MD  Artificial Saliva (BIOTENE DRY MOUTH MOISTURIZING) SOLN Take 2 application by mouth See admin instructions. 1-2 sprays into the mouth 4 times a day   Yes Historical Provider, MD  aspirin 81 MG tablet Take 81 mg by mouth daily with breakfast.    Yes Historical Provider, MD  Hypromellose (ARTIFICIAL TEARS) 0.4 % SOLN Apply 2 drops to eye 3 (three) times daily.   Yes Historical Provider, MD  Melatonin 10 MG TABS Take 5 mg by mouth at bedtime.  Yes Historical Provider, MD  Polyvinyl Alcohol-Povidone (REFRESH OP) Apply 2 drops to eye at bedtime as needed (for dry eyes).    Yes Historical Provider, MD  saccharomyces boulardii (FLORASTOR) 250 MG capsule Take 250 mg by mouth 2 (two) times daily.   Yes Historical Provider, MD    Family History No family history on file.  Social History Social History  Substance Use Topics  . Smoking status: Never Smoker  . Smokeless tobacco: Never Used  . Alcohol use No     Allergies   Cephalosporins; Ciprofloxacin; Lisinopril; Other; Pyridium [phenazopyridine hcl]; Aleve [naproxen]; Iodine; Macrobid [nitrofurantoin monohyd macro]; Penicillins; and Shellfish allergy   Review of Systems Review of Systems  Constitutional: Negative for chills and fever.  HENT: Negative for sore throat.   Eyes: Negative for  redness.  Respiratory: Negative for shortness of breath.   Cardiovascular: Negative for chest pain.  Gastrointestinal: Positive for abdominal pain and vomiting. Negative for constipation and diarrhea.  Genitourinary: Negative for flank pain.  Musculoskeletal: Negative for back pain.  Skin: Negative for rash.  Neurological: Negative for headaches.  Hematological: Does not bruise/bleed easily.  Psychiatric/Behavioral: The patient is not nervous/anxious.      Physical Exam Updated Vital Signs BP 127/87 (BP Location: Left Arm)   Pulse 94   Temp 97.9 F (36.6 C) (Oral)   Resp 22   SpO2 92%   Physical Exam  Constitutional: She appears well-developed and well-nourished. No distress.  HENT:  Dry mm.  Eyes: Conjunctivae are normal. No scleral icterus.  Neck: Neck supple. No tracheal deviation present.  Cardiovascular: Normal rate, regular rhythm, normal heart sounds and intact distal pulses.   Pulmonary/Chest: Effort normal and breath sounds normal. No respiratory distress.  Abdominal: Soft. Normal appearance and bowel sounds are normal. She exhibits distension. She exhibits no mass. There is no tenderness. There is no rebound and no guarding. No hernia.  Genitourinary:  Genitourinary Comments: No cva tenderness  Musculoskeletal: She exhibits no edema.  Neurological: She is alert.  Skin: Skin is warm and dry. No rash noted. She is not diaphoretic.  Psychiatric: She has a normal mood and affect.  Nursing note and vitals reviewed.    ED Treatments / Results  Labs (all labs ordered are listed, but only abnormal results are displayed) Results for orders placed or performed during the hospital encounter of 08/23/2016  CBC  Result Value Ref Range   WBC 19.0 (H) 4.0 - 10.5 K/uL   RBC 3.19 (L) 3.87 - 5.11 MIL/uL   Hemoglobin 9.0 (L) 12.0 - 15.0 g/dL   HCT 28.4 (L) 36.0 - 46.0 %   MCV 89.0 78.0 - 100.0 fL   MCH 28.2 26.0 - 34.0 pg   MCHC 31.7 30.0 - 36.0 g/dL   RDW 17.0 (H) 11.5 -  15.5 %   Platelets 560 (H) 150 - 400 K/uL  Comprehensive metabolic panel  Result Value Ref Range   Sodium 141 135 - 145 mmol/L   Potassium 3.4 (L) 3.5 - 5.1 mmol/L   Chloride 96 (L) 101 - 111 mmol/L   CO2 32 22 - 32 mmol/L   Glucose, Bld 178 (H) 65 - 99 mg/dL   BUN 34 (H) 6 - 20 mg/dL   Creatinine, Ser 0.50 0.44 - 1.00 mg/dL   Calcium 9.3 8.9 - 10.3 mg/dL   Total Protein 6.7 6.5 - 8.1 g/dL   Albumin 2.9 (L) 3.5 - 5.0 g/dL   AST 22 15 - 41 U/L   ALT  14 14 - 54 U/L   Alkaline Phosphatase 54 38 - 126 U/L   Total Bilirubin 0.7 0.3 - 1.2 mg/dL   GFR calc non Af Amer >60 >60 mL/min   GFR calc Af Amer >60 >60 mL/min   Anion gap 13 5 - 15  Urinalysis, Routine w reflex microscopic (not at [facility])  Result Value Ref Range   Color, Urine YELLOW YELLOW   APPearance TURBID (A) CLEAR   Specific Gravity, Urine 1.023 1.005 - 1.030   pH 7.0 5.0 - 8.0   Glucose, UA NEGATIVE NEGATIVE mg/dL   Hgb urine dipstick SMALL (A) NEGATIVE   Bilirubin Urine NEGATIVE NEGATIVE   Ketones, ur NEGATIVE NEGATIVE mg/dL   Protein, ur 100 (A) NEGATIVE mg/dL   Nitrite NEGATIVE NEGATIVE   Leukocytes, UA LARGE (A) NEGATIVE  Urine microscopic-add on  Result Value Ref Range   Squamous Epithelial / LPF NONE SEEN NONE SEEN   WBC, UA TOO NUMEROUS TO COUNT 0 - 5 WBC/hpf   RBC / HPF NONE SEEN 0 - 5 RBC/hpf   Bacteria, UA MANY (A) NONE SEEN   Dg Abd Acute W/chest  Result Date: 08/25/2016 CLINICAL DATA:  Vomiting. EXAM: DG ABDOMEN ACUTE W/ 1V CHEST COMPARISON:  Chest x-ray 07/10/2016 FINDINGS: Elevation of the right hemidiaphragm with right base atelectasis. Left lower lobe atelectasis present as well. Mild cardiomegaly. No free air organomegaly. No evidence of bowel obstruction. Leftward scoliosis in the lumbar spine with degenerative changes. No acute bony abnormality. IMPRESSION: No obstruction or free air. Elevated right hemidiaphragm.  Bibasilar atelectasis. Electronically Signed   By: Rolm Baptise M.D.   On: 08/05/2016  08:10    EKG  EKG Interpretation None       Radiology Dg Abd Acute W/chest  Result Date: 08/24/2016 CLINICAL DATA:  Vomiting. EXAM: DG ABDOMEN ACUTE W/ 1V CHEST COMPARISON:  Chest x-ray 07/10/2016 FINDINGS: Elevation of the right hemidiaphragm with right base atelectasis. Left lower lobe atelectasis present as well. Mild cardiomegaly. No free air organomegaly. No evidence of bowel obstruction. Leftward scoliosis in the lumbar spine with degenerative changes. No acute bony abnormality. IMPRESSION: No obstruction or free air. Elevated right hemidiaphragm.  Bibasilar atelectasis. Electronically Signed   By: Rolm Baptise M.D.   On: 08/22/2016 08:10    Procedures Procedures (including critical care time)  Medications Ordered in ED Medications  sodium chloride 0.9 % bolus 1,000 mL (not administered)  ondansetron (ZOFRAN) injection 4 mg (not administered)     Initial Impression / Assessment and Plan / ED Course  I have reviewed the triage vital signs and the nursing notes.  Pertinent labs & imaging results that were available during my care of the patient were reviewed by me and considered in my medical decision making (see chart for details).  Clinical Course     Iv ns bolus.  zofran iv.  Labs sent. xrays ordered.  Reviewed nursing notes and prior charts for additional history.   ua c/w uti. ucx added to labs.   Multiple antibiotic allergies noted.  Aztreonam iv.   Given nv, dehydration, and uti, will admit.     Final Clinical Impressions(s) / ED Diagnoses   Final diagnoses:  None    New Prescriptions New Prescriptions   No medications on file     Lajean Saver, MD 08/19/16 [phone]

## 2016-08-19 NOTE — ED Notes (Signed)
Dr. Tana Coast has just called and told me she will admit pt. To step-down; and that she will notify the flow manager of same. She remains in no distress and has not vomited since receiving IV Zofran.

## 2016-08-20 LAB — BASIC METABOLIC PANEL
Anion gap: 17 — ABNORMAL HIGH (ref 5–15)
BUN: 51 mg/dL — AB (ref 6–20)
CHLORIDE: 94 mmol/L — AB (ref 101–111)
CO2: 32 mmol/L (ref 22–32)
CREATININE: 1.23 mg/dL — AB (ref 0.44–1.00)
Calcium: 9.2 mg/dL (ref 8.9–10.3)
GFR calc Af Amer: 43 mL/min — ABNORMAL LOW (ref >60)
GFR calc non Af Amer: 37 mL/min — ABNORMAL LOW (ref >60)
GLUCOSE: 166 mg/dL — AB (ref 65–99)
POTASSIUM: 4.3 mmol/L (ref 3.5–5.1)
SODIUM: 143 mmol/L (ref 135–145)

## 2016-08-21 LAB — URINE CULTURE: Culture: >=100000 — AB

## 2016-08-24 LAB — CULTURE, BLOOD (ROUTINE X 2)
CULTURE: NO GROWTH
Culture: NO GROWTH

## 2016-08-31 ENCOUNTER — Ambulatory Visit: Payer: Medicare Other | Admitting: Podiatry

## 2016-09-01 NOTE — Progress Notes (Signed)
Dr. Tamala Julian paged at this time regarding resulted BMP and pt current HR. Will continue to monitor.

## 2016-09-01 NOTE — Discharge Summary (Addendum)
Expiration Note/ Death Summary  Whitney Marquez  MR#: [id number]  DOB:April 05, 1923  Date of Admission: August 25, 2016 Date of Death: 2016/08/26  Attending 22  Patient's PCP: Mathews Argyle, MD   Cause of Death: Acute respiratory failure ([place])  Secondary Diagnoses . Aspiration pneumonia . Acute UTI . Cerebrovascular disease . Dehydration . Dementia with behavioral disturbance . Essential hypertension . Nausea, vomiting with mild abdominal distention . Aortic stenosis   Brief H and P: For complete details please refer to admission H and P, but in brief Patient was a 82 year old female with dementia, aortic valve stenosis, hypertension recent falls in a skilled nursing facility, was sent from the rehab facility for hypoxia, nausea and vomiting. History was obtained from the patient's daughter at the bedside as patient was unable to provide any history due to dementia.. Patient's daughter stated that a month ago patient was living in an independent living facility at [facility]. She subsequently had a fall but had no fractures and was moved to skilled nursing for rehabilitation. She also had private caregivers. A night before the admission, patient had nausea and vomiting, couple of episodes, no hematemesis. She had normal BM a day before admission, no diarrhea, mild abdominal distention with crampy abdominal pain which was improved. No fevers or chills, no dysuria. However on the morning of admission, the staff noticed that patient is breathing was shallow and she was hypoxic. So she was sent to ER for evaluation. At the time of my examination, Patient didnot have any shallow breathing, however O2 sats are between 89-91% on 5 L of O2 via nasal cannula. Per daughter at the bedside, patient does not wear any oxygen at baseline.  Hospital Course: Acute respiratory failure ([place]) with hypoxia:  The patient was admitted to stepdown unit. Differential diagnosis includes  aspiration pneumonia from nausea and vomiting yesterday versus pulmonary embolism (recent fall, and has been in rehabilitation in the last 1 month) versus atelectasis. Patient does not have a history of COPD, did not wear oxygen at the baseline - Initial acute abdominal series with chest x-ray showed no obstruction or free air with bibasilar atelectasis.  - Patient was started on aztreonam for UTI as she has multiple antibiotics allergy and  which covered for possible aspiration pneumonia - D-dimer was obtained due to concern for possible pulmonary embolism and was elevated at 2.2. Due to history of iodine allergy, VQ scan was obtained and showed no ventilation perfusion defect, very low probability of PE. -UA had shown UTI and patient was placed on aztreonam. Urine culture showed enterococcus. Blood cultures has remained negative.  - Overnight, patient was noticed to have tachycardia, shortness of breath and elevated BNP 251. She received 1 dose of IV Lasix 20 mg with improvement in the aeration on the chest x-ray. However patient continued to have nonsustained tachycardia with heart rate in 170's. BMET showed potassium of 4.3 otherwise no significant lab abnormalities creatinine 1.2. She was DNR/DNI per family's wishes.  Unfortunately patient expired at 1:45 AM. There is a possibility that patient may have had aspirated which caused the acute respiratory failure.    UTI (urinary tract infection) - Patient was placed on aztreonam.  Nausea/vomiting/mild abdominal distention - Per patient's daughter patient had episodes of nausea and vomiting and abdominal discomfort with distention prior to admission. Acute abdominal series was negative for any obstruction or free air.     History of Aortic stenosis    Dementia with behavioral disturbance    Cerebrovascular disease  SignedEstill Cotta M.D. [facility] 08/22/2016, 4:42 PM Pager: [phone]  Addendum:  Coding query: Patient  also had acute kidney injury Cr 1.2   RAI,RIPUDEEP M.D. [facility] 09/11/2016, 12:59 PM  Pager: [phone]

## 2016-09-01 NOTE — Progress Notes (Signed)
Pt expired at [place]. RN and pt son, Jaunice Kunze, at bedside. MD, Dr. Tamala Julian, notified.

## 2016-09-01 DEATH — deceased

## 2017-06-21 IMAGING — DX DG CHEST 1V PORT
1 series · 1 of 1 positions shown · non-contrast
Comparison: 07/02/2016

CLINICAL DATA: Shortness of breath.

EXAM:
PORTABLE CHEST 1 VIEW

[chest ap]
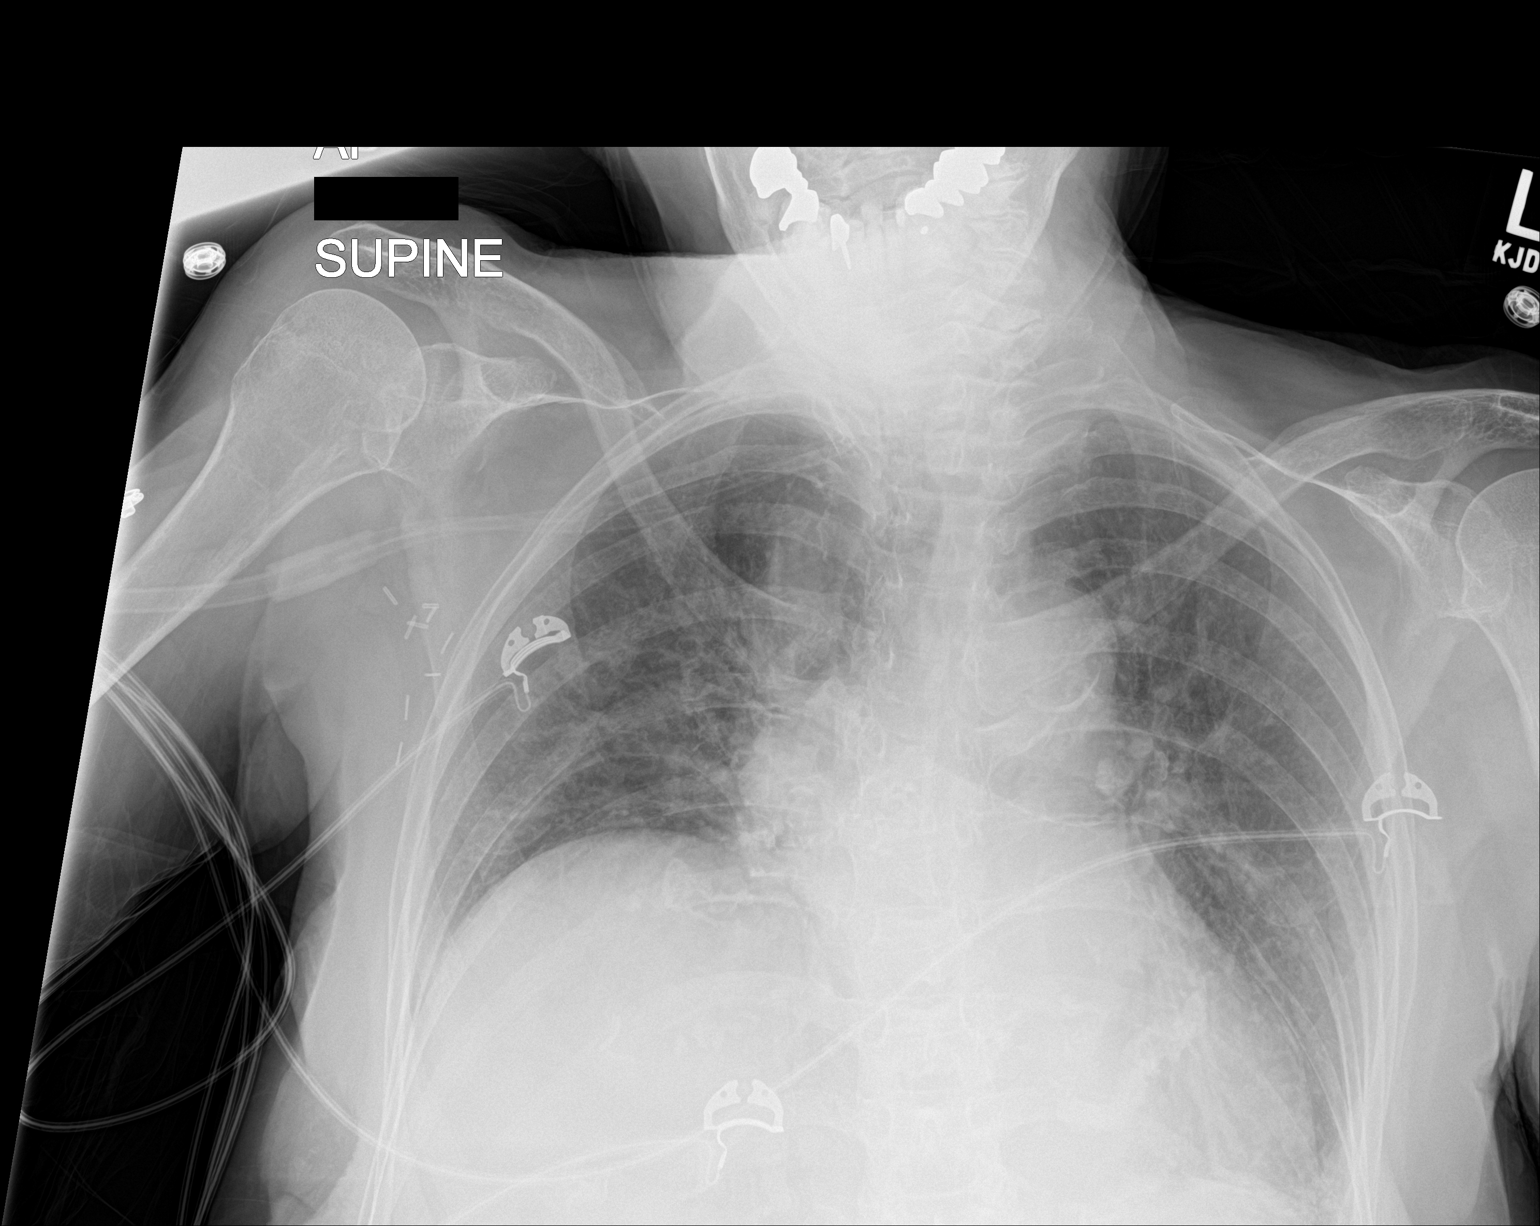

[1 of 1 positions shown; findings below may reference images not displayed]

FINDINGS: Chronic elevation of the right hemidiaphragm. Heart and mediastinum
are stable. Aortic arch has atherosclerotic calcifications. Surgical
clips in the right axilla. Subtle interstitial densities throughout
both lungs could represent mild edema versus chronic change.
Negative for a pneumothorax.
IMPRESSION: Question mild interstitial edema.

Stable elevation of the right hemidiaphragm.

Aortic atherosclerosis.
